# Patient Record
Sex: Female | Born: 1970
Health system: Southern US, Community
[De-identification: ages and names within clinical notes are randomized; demographics above are authoritative.]

## PROBLEM LIST (undated history)

## (undated) DIAGNOSIS — K219 Gastro-esophageal reflux disease without esophagitis: Secondary | ICD-10-CM

## (undated) DIAGNOSIS — M1990 Unspecified osteoarthritis, unspecified site: Secondary | ICD-10-CM

## (undated) DIAGNOSIS — Z923 Personal history of irradiation: Secondary | ICD-10-CM

## (undated) DIAGNOSIS — C50511 Malignant neoplasm of lower-outer quadrant of right female breast: Secondary | ICD-10-CM

## (undated) DIAGNOSIS — Z800 Family history of malignant neoplasm of digestive organs: Secondary | ICD-10-CM

## (undated) DIAGNOSIS — C50919 Malignant neoplasm of unspecified site of unspecified female breast: Secondary | ICD-10-CM

## (undated) HISTORY — DX: Unspecified osteoarthritis, unspecified site: M19.90

## (undated) HISTORY — DX: Family history of malignant neoplasm of digestive organs: Z80.0

## (undated) HISTORY — PX: WISDOM TOOTH EXTRACTION: SHX21

## (undated) HISTORY — DX: Malignant neoplasm of unspecified site of unspecified female breast: C50.919

## (undated) HISTORY — DX: Malignant neoplasm of lower-outer quadrant of right female breast: C50.511

---

## 2015-06-01 ENCOUNTER — Encounter (HOSPITAL_COMMUNITY): Payer: Self-pay

## 2015-06-01 ENCOUNTER — Ambulatory Visit (HOSPITAL_COMMUNITY)
Admission: RE | Admit: 2015-06-01 | Discharge: 2015-06-01 | Disposition: A | Discharge status: Home / Self Care | Payer: Medicaid Other | Source: Ambulatory Visit | Attending: Obstetrics and Gynecology | Admitting: Obstetrics and Gynecology

## 2015-06-01 VITALS — BP 112/80 | Temp 98.8°F | Ht 67.0 in | Wt 224.0 lb

## 2015-06-01 DIAGNOSIS — N63 Unspecified lump in breast: Secondary | ICD-10-CM | POA: Diagnosis present

## 2015-06-01 DIAGNOSIS — N6314 Unspecified lump in the right breast, lower inner quadrant: Secondary | ICD-10-CM

## 2015-06-01 DIAGNOSIS — Z01419 Encounter for gynecological examination (general) (routine) without abnormal findings: Secondary | ICD-10-CM | POA: Diagnosis not present

## 2015-06-01 NOTE — Progress Notes (Signed)
Complaints of right breast lump x 8 weeks that is painful at times. Patient stated she hasn't felt any pain within the last week. Patient rated pain at a 3-6 out of 10.  Pap Smear: Completed Pap smear today. Last Pap smear was 7 years ago at [facility] and normal per patient. Per patient has no history of an abnormal Pap smear. No Pap smear results in EPIC.  Physical exam: Breasts Breasts symmetrical. No skin abnormalities left breast. Scar on right outer breast where patient stated she had a lump that used a natural treatment to heal that left a scar. No nipple retraction bilateral breasts. No nipple discharge bilateral breasts. No lymphadenopathy. Palpated a lump versus thickened tissue right breast at 4 o'clock under areola. Referred patient to [facility] for diagnostic mammogram. Appointment scheduled for Monday, June 05, 2015 at 0815.          Pelvic/Bimanual   Ext Genitalia No lesions, no swelling and no discharge observed on external genitalia.         Vagina Vagina pink and normal texture. No lesions or discharge observed in vagina.          Cervix Cervix is present. Cervix pink and of normal texture. No discharge observed.    Uterus Uterus is present and palpable. Uterus in normal position and normal size.       Adnexae Bilateral ovaries present and palpable. No tenderness on palpation.        Rectovaginal No rectal exam completed today since patient had no rectal complaints. No skin abnormalities observed.

## 2015-06-01 NOTE — Patient Instructions (Signed)
Educational materials on breast self awareness given. Explained to [employer] that [place] will cover Pap smears and HPV typing every 5 years unless has a history of abnormal Pap smears. Referred patient to [facility] for diagnostic mammogram. Appointment scheduled for Monday, June 05, 2015 at 0815. Patient aware of appointment and will be there. Donivan Scull verbalized understanding.  Xylan Sheils, Arvil Chaco, RN 2:08 PM

## 2015-06-07 LAB — CYTOLOGY - PAP

## 2015-06-13 ENCOUNTER — Telehealth (HOSPITAL_COMMUNITY): Payer: Self-pay | Admitting: *Deleted

## 2015-06-13 NOTE — Telephone Encounter (Signed)
Called patient and gave results to Pap smear and HPV typing. Let her know that both were negative and that her next Pap smear is due in 5 years. Patient verbalized understanding.

## 2015-06-15 ENCOUNTER — Other Ambulatory Visit: Payer: Self-pay | Admitting: Radiology

## 2015-06-19 ENCOUNTER — Other Ambulatory Visit: Payer: Self-pay | Admitting: Radiology

## 2015-06-19 DIAGNOSIS — D0591 Unspecified type of carcinoma in situ of right breast: Secondary | ICD-10-CM

## 2015-06-20 ENCOUNTER — Telehealth: Payer: Self-pay | Admitting: *Deleted

## 2015-06-20 ENCOUNTER — Encounter: Payer: Self-pay | Admitting: *Deleted

## 2015-06-20 DIAGNOSIS — Z171 Estrogen receptor negative status [ER-]: Secondary | ICD-10-CM

## 2015-06-20 DIAGNOSIS — C50511 Malignant neoplasm of lower-outer quadrant of right female breast: Secondary | ICD-10-CM

## 2015-06-20 DIAGNOSIS — C50512 Malignant neoplasm of lower-outer quadrant of left female breast: Secondary | ICD-10-CM | POA: Insufficient documentation

## 2015-06-20 HISTORY — DX: Malignant neoplasm of lower-outer quadrant of right female breast: C50.511

## 2015-06-20 NOTE — Telephone Encounter (Signed)
Confirmed [facility] for 06/28/15 at 0830 .  Instructions and contact information given.

## 2015-06-21 ENCOUNTER — Ambulatory Visit
Admission: RE | Admit: 2015-06-21 | Discharge: 2015-06-21 | Disposition: A | Discharge status: Home / Self Care | Payer: No Typology Code available for payment source | Source: Ambulatory Visit | Attending: Radiology | Admitting: Radiology

## 2015-06-21 DIAGNOSIS — D0591 Unspecified type of carcinoma in situ of right breast: Secondary | ICD-10-CM

## 2015-06-21 MED ORDER — GADOBENATE DIMEGLUMINE 529 MG/ML IV SOLN
20.0000 mL | Freq: Once | INTRAVENOUS | Status: AC | PRN
  Administered 2015-06-21: 20 mL via INTRAVENOUS

## 2015-06-23 ENCOUNTER — Ambulatory Visit: Payer: Self-pay

## 2015-06-27 ENCOUNTER — Other Ambulatory Visit: Payer: Self-pay | Admitting: Radiology

## 2015-06-28 ENCOUNTER — Other Ambulatory Visit (HOSPITAL_BASED_OUTPATIENT_CLINIC_OR_DEPARTMENT_OTHER): Payer: Medicaid Other

## 2015-06-28 ENCOUNTER — Encounter: Payer: Self-pay | Admitting: Physical Therapy

## 2015-06-28 ENCOUNTER — Ambulatory Visit: Payer: Medicaid Other | Attending: General Surgery | Admitting: Physical Therapy

## 2015-06-28 ENCOUNTER — Encounter: Payer: Self-pay | Admitting: Oncology

## 2015-06-28 ENCOUNTER — Ambulatory Visit
Admission: RE | Admit: 2015-06-28 | Discharge: 2015-06-28 | Disposition: A | Discharge status: Home / Self Care | Payer: Self-pay | Source: Ambulatory Visit | Attending: Radiation Oncology | Admitting: Radiation Oncology

## 2015-06-28 ENCOUNTER — Ambulatory Visit (HOSPITAL_BASED_OUTPATIENT_CLINIC_OR_DEPARTMENT_OTHER): Payer: Medicaid Other | Admitting: Oncology

## 2015-06-28 ENCOUNTER — Encounter: Payer: Self-pay | Admitting: Nurse Practitioner

## 2015-06-28 ENCOUNTER — Encounter: Payer: Self-pay | Admitting: Skilled Nursing Facility1

## 2015-06-28 ENCOUNTER — Other Ambulatory Visit: Payer: Self-pay | Admitting: *Deleted

## 2015-06-28 VITALS — BP 131/71 | HR 76 | Temp 98.3°F | Resp 18 | Ht 67.0 in | Wt 223.7 lb

## 2015-06-28 DIAGNOSIS — C50511 Malignant neoplasm of lower-outer quadrant of right female breast: Secondary | ICD-10-CM

## 2015-06-28 DIAGNOSIS — R293 Abnormal posture: Secondary | ICD-10-CM | POA: Diagnosis present

## 2015-06-28 LAB — CBC WITH DIFFERENTIAL/PLATELET
BASO%: 0.8 % (ref 0.0–2.0)
BASOS ABS: 0.1 10*3/uL (ref 0.0–0.1)
EOS ABS: 0.1 10*3/uL (ref 0.0–0.5)
EOS%: 1.2 % (ref 0.0–7.0)
HEMATOCRIT: 41.4 % (ref 34.8–46.6)
HGB: 13.9 g/dL (ref 11.6–15.9)
LYMPH#: 2.3 10*3/uL (ref 0.9–3.3)
LYMPH%: 29.8 % (ref 14.0–49.7)
MCH: 30 pg (ref 25.1–34.0)
MCHC: 33.7 g/dL (ref 31.5–36.0)
MCV: 89.1 fL (ref 79.5–101.0)
MONO#: 0.4 10*3/uL (ref 0.1–0.9)
MONO%: 4.8 % (ref 0.0–14.0)
NEUT#: 4.9 10*3/uL (ref 1.5–6.5)
NEUT%: 63.4 % (ref 38.4–76.8)
PLATELETS: 337 10*3/uL (ref 145–400)
RBC: 4.64 10*6/uL (ref 3.70–5.45)
RDW: 12.2 % (ref 11.2–14.5)
WBC: 7.7 10*3/uL (ref 3.9–10.3)

## 2015-06-28 LAB — COMPREHENSIVE METABOLIC PANEL (CC13)
ALT: 14 U/L (ref 0–55)
ANION GAP: 7 meq/L (ref 3–11)
AST: 15 U/L (ref 5–34)
Albumin: 3.8 g/dL (ref 3.5–5.0)
Alkaline Phosphatase: 57 U/L (ref 40–150)
BUN: 12.2 mg/dL (ref 7.0–26.0)
CALCIUM: 9.6 mg/dL (ref 8.4–10.4)
CHLORIDE: 105 meq/L (ref 98–109)
CO2: 27 mEq/L (ref 22–29)
CREATININE: 0.9 mg/dL (ref 0.6–1.1)
EGFR: 80 mL/min/{1.73_m2} — AB (ref >90)
Glucose: 112 mg/dl (ref 70–140)
POTASSIUM: 4 meq/L (ref 3.5–5.1)
Sodium: 139 mEq/L (ref 136–145)
Total Bilirubin: 0.51 mg/dL (ref 0.20–1.20)
Total Protein: 7.3 g/dL (ref 6.4–8.3)

## 2015-06-28 NOTE — Patient Instructions (Signed)

## 2015-06-28 NOTE — Progress Notes (Signed)
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 She brought it to medical attention when she presented for the free Pap smear screening clinic here 06/01/2015. Exam on that day showed a lump in the right breast at the 4:00 position under the areola. There was also a scar on the right outer breast. This scar was noted in 01/04/2008 when she had her baseline mammogram at [facility]. It had been stable for a year at that time and the patient had treated it with a "blood root paste" which caused scarring. 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 It is in a separate location from the new mass.  On 06/05/2015 [name] underwent bilateral diagnostic mammography at [facility] with bilateral ultrasonography. This found the breast density to be category B. There was a 2.5 cm oval mass in the right breast at the 5:00 position which by ultrasound measured 2.1 cm. In the left breast there was an area of focal asymmetry which by ultrasound was consistent with fibroglandular tissue/benign.  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  Her subsequent history is as detailed below.  INTERVAL HISTORY: Aryanna was evaluated in the multidisciplinary breast cancer clinic 06/28/2015 accompanied by her mother [name] and her sister [name]. Her case was also presented in the multidisciplinary breast cancer conference that same morning. At that time a preliminary plan was proposed: Breast conserving surgery with sentinel lymph node sampling, chemotherapy with anti-HER-2 treatment, either neo-adjuvantly or adjuvantly, adjuvant radiation, and genetics referral.  REVIEW OF SYSTEMS: Aside from the mass itself, there were no specific symptoms leading to the original mammogram, which was routinely scheduled. The patient denies unusual headaches, visual changes, nausea, vomiting, stiff neck, dizziness, or gait imbalance. There has been no cough, phlegm production, or pleurisy, no chest pain or pressure, and no change in bowel or bladder habits. The patient denies fever, rash, bleeding, unexplained fatigue or unexplained weight loss. Arletha does complain of hot flashes and night sweats. She has some perimenopausal symptoms including fatigue. She has some heartburn issues. She has plantar fasciitis. A detailed review of systems was otherwise entirely negative.    Marland Kitchen PAST MEDICAL HISTORY: Past Medical History  Diagnosis Date  . Breast cancer of lower-outer quadrant of right female breast ([place]) 06/20/2015  . Breast cancer ([place])   . Arthritis     PAST SURGICAL HISTORY: History reviewed. No pertinent past surgical history.  FAMILY HISTORY Family History  Problem Relation Age of Onset  . Diabetes Mother   . Hypertension Mother   . Hyperlipidemia Mother   . Cancer Maternal Grandfather   . Diabetes Paternal Grandmother    the patient's parents are both  living, in their early 69s. The patient had no brother, one sister. The patient's mother works by  Education administrator homes for private clients. The patient's sister is a Pharmacist, hospital in [place] there is no history of breast or ovarian cancer in the family. The patient's paternal grandfather died at the age of 36 from stomach cancer felt to be related to iron unusual toxic exposure   GYNECOLOGIC HISTORY:  Patient's last menstrual period was 05/11/2015 (approximate).  menarche age 47, she is [place] P0  she is still having periods but they're quite sporadic. They may occur 6 weeks apart or 3 weeks apart, may be heavy or only spotting. She used oral contraceptives for more than 20 years with no complications   SOCIAL HISTORY:  Simi works multiple jobs as Haematologist, Designer, industrial/product, Radiation protection practitioner, and other part-time jobs. She is divorced and at home lives with 6 dogs.     ADVANCED DIRECTIVES:  not in place    HEALTH MAINTENANCE: Social History  Substance Use Topics  . Smoking status: Never Smoker   . Smokeless tobacco: Never Used  . Alcohol Use: No     Colonoscopy:  PAP: October 2016   Bone density:  Lipid panel:  No Known Allergies  No current outpatient prescriptions on file.   No current facility-administered medications for this visit.    OBJECTIVE: Middle-aged white woman in no acute distress  Filed Vitals:   06/28/15 0835  BP: 131/71  Pulse: 76  Temp: 98.3 F (36.8 C)  Resp: 18     Body mass index is 35.03 kg/(m^2).    ECOG FS:0 - Asymptomatic   Ocular: Sclerae unicteric, pupils equal, round and reactive to light Ear-nose-throat: Oropharynx clear and moist, dentition in good repair  Lymphatic: No cervical or supraclavicular adenopathy Lungs no rales or rhonchi, good excursion bilaterally Heart regular rate and rhythm, no murmur appreciated Abd soft, nontender, positive bowel sounds MSK no focal spinal tenderness, no joint edema Neuro: non-focal, well-oriented, appropriate affect Breasts:  the right breast is status post recent biopsy. There is no significant  ecchymosis. There is a palpable abnormality in the lateral aspect of the breast measuring approximately 2 cm. There are no skin or nipple changes otherwise. The right axilla is benign per the left breast is unremarkable.    LAB RESULTS:  CMP     Component Value Date/Time   NA 139 06/28/2015 0818   K 4.0 06/28/2015 0818   CO2 27 06/28/2015 0818   GLUCOSE 112 06/28/2015 0818   BUN 12.2 06/28/2015 0818   CREATININE 0.9 06/28/2015 0818   CALCIUM 9.6 06/28/2015 0818   PROT 7.3 06/28/2015 0818   ALBUMIN 3.8 06/28/2015 0818   AST 15 06/28/2015 0818   ALT 14 06/28/2015 0818   ALKPHOS 57 06/28/2015 0818   BILITOT 0.51 06/28/2015 0818    INo results found for: SPEP, UPEP  Lab Results  Component Value Date   WBC 7.7 06/28/2015   NEUTROABS 4.9 06/28/2015   HGB 13.9 06/28/2015   HCT 41.4 06/28/2015   MCV 89.1 06/28/2015   PLT 337 06/28/2015      Chemistry      Component Value Date/Time   NA 139 06/28/2015 0818   K 4.0 06/28/2015 0818   CO2 27 06/28/2015 0818   BUN 12.2 06/28/2015 0818   CREATININE 0.9 06/28/2015 0818      Component Value Date/Time   CALCIUM 9.6 06/28/2015 0818   ALKPHOS 57 06/28/2015 0818   AST 15  06/28/2015 0818   ALT 14 06/28/2015 0818   BILITOT 0.51 06/28/2015 0818       No results found for: LABCA2  No components found for: LABCA125  No results for input(s): INR in the last 168 hours.  Urinalysis No results found for: COLORURINE, APPEARANCEUR, LABSPEC, PHURINE, GLUCOSEU, HGBUR, BILIRUBINUR, KETONESUR, PROTEINUR, UROBILINOGEN, NITRITE, LEUKOCYTESUR  STUDIES: Mr Breast Bilateral W Wo Contrast  06/21/2015  CLINICAL DATA:  45 year old female who presented with a palpable right breast mass. The mass in the lower-inner right breast has been subsequently biopsied demonstrating malignancy, which may represent "a primary invasive mammary carcinoma or metastatic disease within a lymph node". LABS:  No recent laboratory values. Correlation made with  prior mammography, the most recent from 06/05/2015. EXAM: BILATERAL BREAST MRI WITH AND WITHOUT CONTRAST TECHNIQUE: Multiplanar, multisequence MR images of both breasts were obtained prior to and following the intravenous administration of 20 ml of MultiHance. THREE-DIMENSIONAL MR IMAGE RENDERING ON INDEPENDENT WORKSTATION: Three-dimensional MR images were rendered by post-processing of the original MR data on an independent workstation. The three-dimensional MR images were interpreted, and findings are reported in the following complete MRI report for this study. Three dimensional images were evaluated at the independent DynaCad workstation COMPARISON:  No prior MRIs available for comparison. FINDINGS: Breast composition: b. Scattered fibroglandular tissue. Background parenchymal enhancement: Marked. Right breast: There is marked background parenchymal foci of enhancement similar to the contralateral breast. In the lower-inner quadrant of the right breast, anterior depth there is an oval avidly enhancing mass with washout kinetics measuring 1.9 x 1.6 x 1.7 cm. A focus of susceptibility seen along the inferolateral aspect of the mass, compatible with a biopsy marking clip. In the upper-outer quadrant of the right breast, there is a heterogeneously enhancing circumscribed oval mass measuring 3.3 x 2.4 cm in axial plane. 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 Just medial to the superior edge of this suspected fibroadenoma, is a suspicious 0.9 x 0.8 x 0.8 cm round enhancing mass at 12 o'clock, anterior depth (series 7, image 67). Left breast: Though there is marked background parenchymal foci of enhancement similar to the contralateral breast, there is no suspicious enhancing mass or abnormal non mass enhancement. Lymph nodes: No abnormal appearing lymph nodes. Ancillary findings:  None. IMPRESSION: 1. The  biopsy-proven malignancy in the lower-inner quadrant of the right breast measures up to 1.9 cm. 2. There is a suspicious 0.9 cm enhancing mass at 12 o'clock just adjacent to the larger benign mass in the right breast. 3.  No MRI evidence of malignancy in the left breast. 4. Marked background parenchymal foci of enhancement bilaterally, limiting sensitivity of MRI. RECOMMENDATION: Second-look ultrasound recommended for the 0.9 cm mass in the right breast at 12 o'clock, anterior depth. If not seen sonographically, a MRI guided biopsy will be recommended. BI-RADS CATEGORY  BI-RADS 4: Suspicious. Electronically Signed   By: Ammie Ferrier M.D.   On: 06/21/2015 14:41    ASSESSMENT: 44 y.o. [place] woman status post left breast biopsy 06/15/2015 for a clinical T1 cN0, stage IA invasive ductal carcinoma, grade 3, estrogen and progesterone receptor negative, HER-2 amplified, with an MIB-1 of 40%  (1) neoadjuvant chemotherapy to consist of carboplatin, docetaxel, pertuzumab, and trastuzumab with onpro support every 3 weeks 6  (2) breast conserving surgery with sentinel lymph node sampling to follow  (3) adjuvant radiation to follow surgery  (4) trastuzumab  to be continued to complete a year  (5) genetics testing pending  PLAN: We spent the better part of today's hour-long appointment discussing the biology of breast cancer in general, and the specifics of the patient's tumor in particular. Georgina Peer understands while her cancer is stage I (or early stage II if we use the ultrasound measurement) this is an aggressive fast-growing tumor and will require systemic therapy including anti-HER-2 immunotherapy and chemotherapy. She will not be a candidate for anti-estrogens since the tumor is estrogen and progesterone receptor negative.  She understands the difference between local and systemic treatments and the fact that there is no survival difference between sequencing chemotherapy/anti-HER-2 treatments  before surgery or doing at the other way around. In her case because of the delay in the genetics testing and because we would like to make sure to save her nipple neoadjuvant systemic therapy appears preferable.  We discussed the possible toxicities, side effects and complications of carboplatin and docetaxel as well as trastuzumab and pertuzumab. She will need an echocardiogram, and to come to chemotherapy school as well as have a port insertion before we can start, but we are hoping to begin her chemotherapy on December 6. Before that date also she will meet with either myself or my 98 assistant to discuss anti-emetics and other supportive therapies.  By the time she completes her chemotherapy we should have the genetics results. If she does carry a deleterious mutation she may wish to proceed to bilateral mastectomies, although that would not be mandatory. We could instead do yearly MRIs to intensifies screening. In that regard her financial situation may be decisive. If we cannot arrange for yearly MRIs then bilateral mastectomies may be harder to avoid. Of course if she does carry a deleterious mutation she would also need bilateral salpingo-oophorectomy.  Nayelly has a good understanding of the overall plan. She agrees with it. She knows the goal of treatment in her case is cure. She will call with any problems that may develop before her next visit here.  Chauncey Cruel, MD   06/28/2015 2:16 PM Medical Oncology and Hematology [facility] [address] [place], [place] [address] Tel. [phone]    Fax. [phone]

## 2015-06-28 NOTE — Therapy (Signed)
[place], [place], [address] Phone: [phone]   Fax:  [phone]  Physical Therapy Evaluation  Patient Details  Name: Phyllis Wiggins MRN: [id number] Date of Birth: 1971/04/28 Referring Provider: Dr. Autumn Messing  Encounter Date: 06/28/2015      PT End of Session - 06/28/15 1226    Visit Number 1   Number of Visits 1   PT Start Time 7371   PT Stop Time 1208   PT Time Calculation (min) 33 min   Activity Tolerance Patient tolerated treatment well   Behavior During Therapy [facility] for tasks assessed/performed      Past Medical History  Diagnosis Date  . Breast cancer of lower-outer quadrant of right female breast ([place]) 06/20/2015  . Breast cancer ([place])   . Arthritis     History reviewed. No pertinent past surgical history.  There were no vitals filed for this visit.  Visit Diagnosis:  Carcinoma of lower outer quadrant of right breast ([place]) - Plan: PT plan of care cert/re-cert  Abnormal posture - Plan: PT plan of care cert/re-cert      Subjective Assessment - 06/28/15 1216    Subjective Patient was seen today for a baseline assessment of her newly diagnosed right breast cancer.   Patient is accompained by: Family member   Pertinent History Patient was diagnosed on 06/05/15 with right grade 3 ER/PR negative, HER2 positive breast cancer.  The mass measures 1.9 cm on MRI and is located in the lower inner quadrant and has a Ki67 of 40%.   Patient Stated Goals Reduce lymphedema risk and learn post op shoulder ROM HEP   Currently in Pain? No/denies            [facility] PT Assessment - 06/28/15 0001    Assessment   Medical Diagnosis Right breast cancer   Referring Provider Dr. Autumn Messing   Onset Date/Surgical Date 06/05/15   Hand Dominance Right   Prior Therapy none   Precautions   Precautions Other (comment)  Active breast cancer   Restrictions   Weight Bearing Restrictions No   Balance  Screen   Has the patient fallen in the past 6 months No   Has the patient had a decrease in activity level because of a fear of falling?  No   Is the patient reluctant to leave their home because of a fear of falling?  No   Home Social worker Private residence   Living Arrangements Alone   Available Help at Discharge Family   Prior Function   Level of Independence Independent   Vocation Full time employment   Vocation Requirements Dog Dentist   Leisure She does not exercise   Cognition   Overall Cognitive Status Within Functional Limits for tasks assessed   Posture/Postural Control   Posture/Postural Control Postural limitations   Postural Limitations Forward head;Rounded Shoulders   ROM / Strength   AROM / PROM / Strength AROM;Strength   AROM   AROM Assessment Site Shoulder   Right/Left Shoulder Right;Left   Right Shoulder Extension 61 Degrees   Right Shoulder Flexion 162 Degrees   Right Shoulder ABduction 170 Degrees   Right Shoulder Internal Rotation 70 Degrees   Right Shoulder External Rotation 90 Degrees   Left Shoulder Extension 60 Degrees   Left Shoulder Flexion 174 Degrees   Left Shoulder ABduction 162 Degrees   Left Shoulder Internal Rotation 80 Degrees   Left Shoulder External Rotation 90 Degrees  Strength   Overall Strength Within functional limits for tasks performed           LYMPHEDEMA/ONCOLOGY QUESTIONNAIRE - 06/28/15 1225    Type   Cancer Type Right breast cancer   Lymphedema Assessments   Lymphedema Assessments Upper extremities   Right Upper Extremity Lymphedema   10 cm Proximal to Olecranon Process 34.5 cm   Olecranon Process 26.7 cm   10 cm Proximal to Ulnar Styloid Process 24.6 cm   Just Proximal to Ulnar Styloid Process 15.9 cm   Across Hand at [employer] 19.1 cm   At [place] of 2nd Digit 6.1 cm   Left Upper Extremity Lymphedema   10 cm Proximal to Olecranon Process 33.8 cm   Olecranon Process  26.5 cm   10 cm Proximal to Ulnar Styloid Process 23.6 cm   Just Proximal to Ulnar Styloid Process 16.3 cm   Across Hand at [employer] 18 cm   At [place] of 2nd Digit 6 cm            Patient was instructed today in a home exercise program today for post op shoulder range of motion. These included active assist shoulder flexion in sitting, scapular retraction, wall walking with shoulder abduction, and hands behind head external rotation.  She was encouraged to do these twice a day, holding 3 seconds and repeating 5 times when permitted by her physician.                 PT Education - 06/28/15 1226    Education provided Yes   Education Details Lymphedema risk reduction and post op shoulder ROM HEP   Person(s) Educated Patient   Methods Explanation;Demonstration;Handout   Comprehension Verbalized understanding;Returned demonstration              Breast Clinic Goals - 06/28/15 1228    Patient will be able to verbalize understanding of pertinent lymphedema risk reduction practices relevant to her diagnosis specifically related to skin care.   Time 1   Period Days   Status Achieved   Patient will be able to return demonstrate and/or verbalize understanding of the post-op home exercise program related to regaining shoulder range of motion.   Time 1   Period Days   Status Achieved   Patient will be able to verbalize understanding of the importance of attending the postoperative After Breast Cancer Class for further lymphedema risk reduction education and therapeutic exercise.   Time 1   Period Days   Status Achieved              Plan - 06/28/15 1226    Clinical Impression Statement Patient was diagnosed on 06/05/15 with right grade 3 ER/PR negative, HER2 positive breast cancer.  The mass measures 1.9 cm on MRI and is located in the lower inner quadrant and has a Ki67 of 40%.  She is planning to have neoadjuvant chemotherapy followed by right lumpectomy and  sentinel node biopsy followed by radiation.  She will benefit from post op PT to regain shoulder ROM and reduce lymphedema risk if needed.   Pt will benefit from skilled therapeutic intervention in order to improve on the following deficits Decreased range of motion;Impaired UE functional use;Decreased knowledge of precautions;Pain;Decreased strength   Rehab Potential Excellent   Clinical Impairments Affecting Rehab Potential none   PT Frequency One time visit   PT Treatment/Interventions Therapeutic exercise;Patient/family education   Consulted and Agree with Plan of Care Patient;Family member/caregiver   Family Member  Consulted Mom and sister     Patient will follow up at outpatient cancer rehab if needed following surgery.  If the patient requires physical therapy at that time, a specific plan will be dictated and sent to the referring physician for approval. The patient was educated today on appropriate basic range of motion exercises to begin post operatively and the importance of attending the After Breast Cancer class following surgery.  Patient was educated today on lymphedema risk reduction practices as it pertains to recommendations that will benefit the patient immediately following surgery.  She verbalized good understanding.  No additional physical therapy is indicated at this time.       Problem List Patient Active Problem List   Diagnosis Date Noted  . Breast cancer of lower-outer quadrant of right female breast [facility]) 06/20/2015    Annia Friendly, PT 06/28/2015 12:30 PM  [place], [place], [address] Phone: [phone]   Fax:  [phone]  Name: Phyllis Wiggins MRN: [id number] Date of Birth: May 26, 1971

## 2015-06-28 NOTE — Progress Notes (Signed)
Subjective:     Patient ID: Phyllis Wiggins, female   DOB: 1971/03/14, 44 y.o.   MRN: [id number]  HPI   Review of Systems     Objective:   Physical Exam For the patient to understand and be given the tools to implement a healthy plant based diet during their cancer diagnosis.     Assessment:     Patient was seen today and found to be in good spirits and accompanied by her family. Pt states she knows what she should eat and does not need education. Pt was extremely skeptical of soy and refuses to eat it even after the information was presented. Pt states she is allergic to lactose and whey. Pts medication: essential oils. Pts labs WNL. Pts ht 5'7'', wt 223 pounds, and BMI 35.1.      Plan:     Dietitian educated the patient on implementing a plant based diet by incorporating more plant proteins, fruits, and vegetables. As a part of a healthy routine physical activity was discussed. Dietitian had the pt educate her but the pt could not answer any of the questions correctly based on current research. The importance of legitimate, evidence based information was discussed and examples were given. A folder of evidence based information with a focus on a plant based diet and general nutrition during cancer was given to the patient.  As a part of the continuum of care the cancer dietitian's contact information was given to the patient in the event they would like to have a follow up appointment.

## 2015-06-28 NOTE — Progress Notes (Signed)
Radiation Oncology         [phone]) [phone] ________________________________  Initial outpatient Consultation - Date: 06/28/2015   Name: Phyllis Wiggins MRN: [id number]   DOB: [birth year]  REFERRING PHYSICIAN: No ref. provider found  DIAGNOSIS AND STAGE: T2 N0 right breast cancer.  HISTORY OF PRESENT ILLNESS::Phyllis Wiggins is a 44 y.o. female who presents with a palpable right breast mass. She has a history of a benign fibroadenoma which was biopsied in 2009. This mass measured 2.1 cm. 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 MRI was performed which showed mass seen on mammogram as well as a second mass adjacent to the fibroadenoma. These masses were 4.3 cm apart. This second mass was biopsied 11/22 and pathology was benign.  She is accompanied by her mother who has many friends who completed breast treatment and her sister. She has many food allergies per her report.   She is GXP0 with her last menstrual period recently. She is using oral contraceptives.   She was actually the foster mom of my dog, JC, and has between 7 and 11 dogs as well as a chinchilla who live with her full time.   PREVIOUS RADIATION THERAPY: No  Past medical, social and family history were reviewed in the electronic chart. Review of symptoms was reviewed in the electronic chart. Medications were reviewed in the electronic chart.   PHYSICAL EXAM:. BP:  131/71   Pulse:  76   Temp:  98.3 F (36.8 C)   Resp:  18   Body mass index is 35.03 kg/(m^2). ECOG FS:0 - Asymptomatic   She is a pleasant female in no distress. She has 2 biopsy sites in the right breast. There is a 2 cm palpable mass in the upper outer quadrant of the right breast. She has no palpable axillary adenopathy. She has no abnormalities of the left breast. She is alert and oriented x 3.   IMPRESSION: [name] is a 43 yo female with T2 N0 right breast cancer.  PLAN:We discussed the  role of radiation and decreasing local failures in patients who undergo lumpectomy. We discussed the retrospective data showing an increase in failure rates in patients who have a pathologic complete response and did not undergo radiation. For this reason I have recommended radiation to the whole breast followed by boost to the tumor bed regardless of her response to chemotherapy. We discussed the process of simulation the placement tattoos. We discussed possible side effects during treatment including but not limited to skin irritation darkness and fatigue. We discussed long-term effects of treatment which are extremely unlikely but possible including damage to the lungs and ribs. We discussed the low likelihood of secondary malignancies.   She will undergo genetic testing, neoadjuvant chemotherapy, lumpectomy and then see me back for radiation.   She also met with surgery, medical oncology as well as a member of our patient family support team, a dietitian, survivorship nurse practitioner, breast cancer navigator, and our physical therapist. She is concerned about finances especially continuing to provide for her pets.  I will refer her to social work.    I spent 40 minutes  face to face with the patient and more than 50% of that time was spent in counseling and/or coordination of care.   ------------------------------------------------  [name], MD    This document serves as a record of services personally performed by [name], MD. It was created on her behalf  by  Lendon Collar, a trained medical scribe. The creation of this record is based on the scribe's personal observations and the provider's statements to them. This document has been checked and approved by the attending provider.

## 2015-06-28 NOTE — Progress Notes (Signed)
Phyllis Wiggins is a very pleasant 44 y.o. female from [place], [place] with newly diagnosed invasive mammary carcinoma of the right breast.  Biopsy results revealed the tumor's prognostic profile is ER negative, PR negative, and HER2/neu positive..  She presents today with her mom and sister to the [facility]) for treatment consideration and recommendations from the breast surgeon, radiation oncologist, and medical oncologist.     I briefly met with Phyllis Wiggins, her mother, and sister during her [facility] visit today. We discussed the purpose of the Survivorship Clinic, which will include monitoring for recurrence, coordinating completion of age and gender-appropriate cancer screenings, promotion of overall wellness, as well as managing potential late/long-term side effects of anti-cancer treatments.    The treatment plan for Phyllis Wiggins will likely include surgery, chemotherapy, and radiation therapy.  She will meet with the Genetics Counselor due to her age. As of today, the intent of treatment for Phyllis Wiggins is cure, therefore she will be eligible for the Survivorship Clinic upon her completion of treatment.  Her survivorship care plan (SCP) document will be drafted and updated throughout the course of her treatment trajectory. She will receive the SCP in an office visit with myself in the Survivorship Clinic once she has completed treatment.   Phyllis Wiggins was encouraged to ask questions and all questions were answered to her satisfaction.  She was given my business card and encouraged to contact me with any concerns regarding survivorship.  I look forward to participating in her care.   Kenn File, [place] [phone]

## 2015-06-30 ENCOUNTER — Telehealth: Payer: Self-pay | Admitting: Nurse Practitioner

## 2015-06-30 NOTE — Telephone Encounter (Signed)
Called and left a message with new echo and chemo class as she is having her port placed 12/1

## 2015-07-03 ENCOUNTER — Telehealth: Payer: Self-pay | Admitting: *Deleted

## 2015-07-03 NOTE — Telephone Encounter (Signed)
Spoke to pt concerning Phyllis Wiggins from 06/28/15. Denies questions or concerns regarding dx or treatment care plan. Relate she has financial concerns. Informed pt that I have reached out to [facility] to call her to discuss programs we have to offer. R/s f/u with Heather to 12/2 d/t pt having port placed on 12/1. Encourage pt to call with needs. Received verbal understanding.

## 2015-07-04 ENCOUNTER — Encounter: Payer: Self-pay | Admitting: Oncology

## 2015-07-04 NOTE — Progress Notes (Signed)
Spoke w/ pt regarding financial concerns.  Pt is enrolled in the BCCCP program and has applied for Medicaid thru [place].  The application is in a pending status.  I informed her of drug replacement that will help with her chemotherapy treatment until Medicaid becomes active.  Informed her of the $1000 [employer] which she says isn't enough but would like to apply for the grant.  I will obtain her check stubs from Etheleen Sia to see if she qualifies for the grant.  I will also forward those check stubs to the Managed Care team for drug replacement.  I let her know of the [employer] and [employer] to apply for additional assistance and we will be happy to fax or email those applications in her behalf.  She verbalized understanding.

## 2015-07-05 ENCOUNTER — Encounter (HOSPITAL_COMMUNITY): Payer: Self-pay | Admitting: *Deleted

## 2015-07-05 ENCOUNTER — Encounter: Payer: Self-pay | Admitting: *Deleted

## 2015-07-05 ENCOUNTER — Other Ambulatory Visit: Payer: Self-pay | Admitting: General Surgery

## 2015-07-05 MED ORDER — CEFAZOLIN SODIUM-DEXTROSE 2-3 GM-% IV SOLR
2.0000 g | INTRAVENOUS | Status: AC
  Administered 2015-07-06: 2 g via INTRAVENOUS
  Filled 2015-07-05: qty 50

## 2015-07-05 MED ORDER — CHLORHEXIDINE GLUCONATE 4 % EX LIQD
1.0000 | Freq: Once | CUTANEOUS | Status: DC
Start: 2015-07-05 — End: 2015-07-08

## 2015-07-05 MED ORDER — CHLORHEXIDINE GLUCONATE 4 % EX LIQD: 1.0000 "application " | Freq: Once | CUTANEOUS | Status: DC

## 2015-07-05 NOTE — Progress Notes (Signed)
[place] Work  Clinical Social Work was referred by Pension scheme manager for assessment of psychosocial needs.  Clinical Social Worker contacted patient at home to offer support and assess for needs.   Pt pretty stressed over the phone and expressed concerns about finances, caring for her animals and coping with treatment. CSW provided supportive listening, safe space to share. CSW reviewed several resources for financial support. Pt and CSW to meet with pt prior to her medonc appt on 07/07/15 to further discuss. Pt appreciative of call and support.   Clinical Social Work interventions:  Supportive listening Resource education  Loren Racer, [place] Worker [place]  [place] Phone: [phone] Fax: [phone]

## 2015-07-05 NOTE — Progress Notes (Addendum)
Pt denies cardiac history, chest pain or sob.   Called Dr. Ethlyn Gallery office to request pre-op orders, spoke with Coralyn Mark.

## 2015-07-06 ENCOUNTER — Ambulatory Visit: Payer: Self-pay | Admitting: Nurse Practitioner

## 2015-07-06 ENCOUNTER — Ambulatory Visit (HOSPITAL_COMMUNITY)
Admission: RE | Admit: 2015-07-06 | Discharge: 2015-07-06 | Disposition: A | Discharge status: Home / Self Care | Payer: Medicaid Other | Source: Ambulatory Visit | Attending: General Surgery | Admitting: General Surgery

## 2015-07-06 ENCOUNTER — Encounter (HOSPITAL_COMMUNITY)
Admission: RE | Disposition: A | Discharge status: Home / Self Care | Payer: Self-pay | Source: Ambulatory Visit | Attending: General Surgery

## 2015-07-06 ENCOUNTER — Ambulatory Visit (HOSPITAL_COMMUNITY): Payer: Medicaid Other | Admitting: Certified Registered Nurse Anesthetist

## 2015-07-06 ENCOUNTER — Ambulatory Visit (HOSPITAL_COMMUNITY): Payer: Medicaid Other

## 2015-07-06 ENCOUNTER — Encounter (HOSPITAL_COMMUNITY): Payer: Self-pay | Admitting: Certified Registered Nurse Anesthetist

## 2015-07-06 ENCOUNTER — Other Ambulatory Visit: Payer: Self-pay

## 2015-07-06 ENCOUNTER — Other Ambulatory Visit (HOSPITAL_COMMUNITY): Payer: Self-pay

## 2015-07-06 DIAGNOSIS — M1990 Unspecified osteoarthritis, unspecified site: Secondary | ICD-10-CM | POA: Insufficient documentation

## 2015-07-06 DIAGNOSIS — Z170 Estrogen receptor positive status [ER+]: Secondary | ICD-10-CM | POA: Diagnosis not present

## 2015-07-06 DIAGNOSIS — C50311 Malignant neoplasm of lower-inner quadrant of right female breast: Secondary | ICD-10-CM | POA: Insufficient documentation

## 2015-07-06 DIAGNOSIS — C50911 Malignant neoplasm of unspecified site of right female breast: Secondary | ICD-10-CM | POA: Diagnosis present

## 2015-07-06 DIAGNOSIS — Z87891 Personal history of nicotine dependence: Secondary | ICD-10-CM | POA: Diagnosis not present

## 2015-07-06 DIAGNOSIS — Z419 Encounter for procedure for purposes other than remedying health state, unspecified: Secondary | ICD-10-CM

## 2015-07-06 DIAGNOSIS — Z95828 Presence of other vascular implants and grafts: Secondary | ICD-10-CM

## 2015-07-06 HISTORY — PX: PORTACATH PLACEMENT: SHX2246

## 2015-07-06 SURGERY — INSERTION, TUNNELED CENTRAL VENOUS DEVICE, WITH PORT
Anesthesia: General | Site: Chest | Laterality: Left

## 2015-07-06 MED ORDER — LACTATED RINGERS IV SOLN
INTRAVENOUS | Status: DC | PRN
  Administered 2015-07-06 (×2): via INTRAVENOUS

## 2015-07-06 MED ORDER — ONDANSETRON HCL 4 MG/2ML IJ SOLN
INTRAMUSCULAR | Status: DC | PRN
  Administered 2015-07-06: 4 mg via INTRAVENOUS

## 2015-07-06 MED ORDER — ONDANSETRON HCL 4 MG/2ML IJ SOLN
INTRAMUSCULAR | Status: AC
  Filled 2015-07-06: qty 2

## 2015-07-06 MED ORDER — PROPOFOL 10 MG/ML IV BOLUS
INTRAVENOUS | Status: DC | PRN
  Administered 2015-07-06: 200 mg via INTRAVENOUS

## 2015-07-06 MED ORDER — ROCURONIUM BROMIDE 50 MG/5ML IV SOLN
INTRAVENOUS | Status: AC
  Filled 2015-07-06: qty 1

## 2015-07-06 MED ORDER — DEXAMETHASONE SODIUM PHOSPHATE 4 MG/ML IJ SOLN
INTRAMUSCULAR | Status: DC | PRN
  Administered 2015-07-06: 8 mg via INTRAVENOUS

## 2015-07-06 MED ORDER — FENTANYL CITRATE (PF) 250 MCG/5ML IJ SOLN
INTRAMUSCULAR | Status: AC
  Filled 2015-07-06: qty 5

## 2015-07-06 MED ORDER — PHENYLEPHRINE 40 MCG/ML (10ML) SYRINGE FOR IV PUSH (FOR BLOOD PRESSURE SUPPORT)
PREFILLED_SYRINGE | INTRAVENOUS | Status: AC
  Filled 2015-07-06: qty 10

## 2015-07-06 MED ORDER — PROPOFOL 10 MG/ML IV BOLUS
INTRAVENOUS | Status: AC
  Filled 2015-07-06: qty 40

## 2015-07-06 MED ORDER — BUPIVACAINE HCL (PF) 0.25 % IJ SOLN
INTRAMUSCULAR | Status: DC | PRN
  Administered 2015-07-06: 8 mL

## 2015-07-06 MED ORDER — FENTANYL CITRATE (PF) 100 MCG/2ML IJ SOLN
INTRAMUSCULAR | Status: DC | PRN
  Administered 2015-07-06: 50 ug via INTRAVENOUS
  Administered 2015-07-06 (×2): 25 ug via INTRAVENOUS

## 2015-07-06 MED ORDER — OXYCODONE-ACETAMINOPHEN 5-325 MG PO TABS
1.0000 | ORAL_TABLET | ORAL | Status: DC | PRN
Start: 2015-07-06 — End: 2016-01-29

## 2015-07-06 MED ORDER — PHENYLEPHRINE HCL 10 MG/ML IJ SOLN
INTRAMUSCULAR | Status: DC | PRN
  Administered 2015-07-06: 80 ug via INTRAVENOUS
  Administered 2015-07-06: 40 ug via INTRAVENOUS
  Administered 2015-07-06: 80 ug via INTRAVENOUS
  Administered 2015-07-06: 40 ug via INTRAVENOUS
  Administered 2015-07-06: 80 ug via INTRAVENOUS

## 2015-07-06 MED ORDER — SODIUM CHLORIDE 0.9 % IV SOLN
INTRAVENOUS | Status: DC | PRN
  Administered 2015-07-06: 500 mL

## 2015-07-06 MED ORDER — ONDANSETRON HCL 4 MG/2ML IJ SOLN: 4.0000 mg | Freq: Once | INTRAMUSCULAR | Status: DC | PRN

## 2015-07-06 MED ORDER — LACTATED RINGERS IV SOLN
INTRAVENOUS | Status: DC
  Administered 2015-07-06: 10:00:00 via INTRAVENOUS

## 2015-07-06 MED ORDER — HEPARIN SOD (PORK) LOCK FLUSH 100 UNIT/ML IV SOLN
INTRAVENOUS | Status: DC | PRN
  Administered 2015-07-06: 5 [IU]

## 2015-07-06 MED ORDER — 0.9 % SODIUM CHLORIDE (POUR BTL) OPTIME
TOPICAL | Status: DC | PRN
  Administered 2015-07-06: 1000 mL

## 2015-07-06 MED ORDER — MIDAZOLAM HCL 2 MG/2ML IJ SOLN
INTRAMUSCULAR | Status: AC
  Filled 2015-07-06: qty 2

## 2015-07-06 MED ORDER — BUPIVACAINE HCL (PF) 0.25 % IJ SOLN
INTRAMUSCULAR | Status: AC
  Filled 2015-07-06: qty 30

## 2015-07-06 MED ORDER — HEPARIN SOD (PORK) LOCK FLUSH 100 UNIT/ML IV SOLN
INTRAVENOUS | Status: AC
  Filled 2015-07-06: qty 5

## 2015-07-06 MED ORDER — FENTANYL CITRATE (PF) 100 MCG/2ML IJ SOLN: 25.0000 ug | INTRAMUSCULAR | Status: DC | PRN

## 2015-07-06 MED ORDER — LIDOCAINE HCL (CARDIAC) 20 MG/ML IV SOLN
INTRAVENOUS | Status: DC | PRN
  Administered 2015-07-06: 100 mg via INTRAVENOUS

## 2015-07-06 MED ORDER — MIDAZOLAM HCL 5 MG/5ML IJ SOLN
INTRAMUSCULAR | Status: DC | PRN
  Administered 2015-07-06: 2 mg via INTRAVENOUS

## 2015-07-06 MED ORDER — DEXAMETHASONE SODIUM PHOSPHATE 4 MG/ML IJ SOLN
INTRAMUSCULAR | Status: AC
  Filled 2015-07-06: qty 2

## 2015-07-06 SURGICAL SUPPLY — 46 items
BAG DECANTER FOR FLEXI CONT (MISCELLANEOUS) ×3 IMPLANT
BLADE SURG 15 STRL LF DISP TIS (BLADE) ×1 IMPLANT
BLADE SURG 15 STRL SS (BLADE) ×2
CHLORAPREP W/TINT 10.5 ML (MISCELLANEOUS) ×3 IMPLANT
COVER SURGICAL LIGHT HANDLE (MISCELLANEOUS) ×3 IMPLANT
COVER TRANSDUCER ULTRASND GEL (DRAPE) IMPLANT
CRADLE DONUT ADULT HEAD (MISCELLANEOUS) ×3 IMPLANT
DRAPE C-ARM 42X72 X-RAY (DRAPES) ×3 IMPLANT
DRAPE CHEST BREAST 15X10 FENES (DRAPES) ×3 IMPLANT
DRAPE UTILITY XL STRL (DRAPES) ×6 IMPLANT
ELECT CAUTERY BLADE 6.4 (BLADE) ×3 IMPLANT
ELECT REM PT RETURN 9FT ADLT (ELECTROSURGICAL) ×3
ELECTRODE REM PT RTRN 9FT ADLT (ELECTROSURGICAL) ×1 IMPLANT
GAUZE SPONGE 4X4 16PLY XRAY LF (GAUZE/BANDAGES/DRESSINGS) ×3 IMPLANT
GEL ULTRASOUND 20GR AQUASONIC (MISCELLANEOUS) IMPLANT
GLOVE BIO SURGEON STRL SZ7 (GLOVE) ×3 IMPLANT
GLOVE BIO SURGEON STRL SZ7.5 (GLOVE) ×9 IMPLANT
GOWN STRL REUS W/ TWL LRG LVL3 (GOWN DISPOSABLE) ×2 IMPLANT
GOWN STRL REUS W/TWL LRG LVL3 (GOWN DISPOSABLE) ×4
INTRODUCER COOK 11FR (CATHETERS) IMPLANT
KIT BASIN OR (CUSTOM PROCEDURE TRAY) ×3 IMPLANT
KIT PORT POWER 8FR ISP CVUE (Catheter) ×3 IMPLANT
KIT ROOM TURNOVER OR (KITS) ×3 IMPLANT
LIQUID BAND (GAUZE/BANDAGES/DRESSINGS) ×3 IMPLANT
NEEDLE 22X1 1/2 (OR ONLY) (NEEDLE) IMPLANT
NEEDLE HYPO 25GX1X1/2 BEV (NEEDLE) ×3 IMPLANT
NS IRRIG 1000ML POUR BTL (IV SOLUTION) ×3 IMPLANT
PACK SURGICAL SETUP 50X90 (CUSTOM PROCEDURE TRAY) ×3 IMPLANT
PAD ARMBOARD 7.5X6 YLW CONV (MISCELLANEOUS) ×3 IMPLANT
PENCIL BUTTON HOLSTER BLD 10FT (ELECTRODE) ×3 IMPLANT
SET INTRODUCER 12FR PACEMAKER (SHEATH) IMPLANT
SET SHEATH INTRODUCER 10FR (MISCELLANEOUS) IMPLANT
SHEATH COOK PEEL AWAY SET 9F (SHEATH) IMPLANT
SUT MNCRL AB 4-0 PS2 18 (SUTURE) ×3 IMPLANT
SUT PROLENE 2 0 SH 30 (SUTURE) ×6 IMPLANT
SUT PROLENE 2 0 SH DA (SUTURE) ×3 IMPLANT
SUT SILK 2 0 (SUTURE)
SUT SILK 2-0 18XBRD TIE 12 (SUTURE) IMPLANT
SUT VIC AB 3-0 SH 27 (SUTURE) ×4
SUT VIC AB 3-0 SH 27XBRD (SUTURE) ×2 IMPLANT
SYR 20ML ECCENTRIC (SYRINGE) ×6 IMPLANT
SYR 5ML LUER SLIP (SYRINGE) ×3 IMPLANT
SYR CONTROL 10ML LL (SYRINGE) ×3 IMPLANT
SYRINGE 10CC LL (SYRINGE) IMPLANT
TOWEL OR 17X24 6PK STRL BLUE (TOWEL DISPOSABLE) ×3 IMPLANT
TOWEL OR 17X26 10 PK STRL BLUE (TOWEL DISPOSABLE) ×3 IMPLANT

## 2015-07-06 NOTE — Anesthesia Postprocedure Evaluation (Signed)
Anesthesia Post Note  Patient: Donivan Scull  Procedure(s) Performed: Procedure(s) (LRB): INSERTION PORT-A-CATH (Left)  Patient location during evaluation: PACU Anesthesia Type: General Level of consciousness: awake and alert Pain management: pain level controlled Vital Signs Assessment: post-procedure vital signs reviewed and stable Respiratory status: spontaneous breathing, nonlabored ventilation, respiratory function stable and patient connected to nasal cannula oxygen Cardiovascular status: blood pressure returned to baseline and stable Postop Assessment: no signs of nausea or vomiting Anesthetic complications: no    Last Vitals:  Filed Vitals:   07/06/15 1145 07/06/15 1152  BP: 111/80 112/85  Pulse: 73 72  Temp:  36.7 C  Resp: 15 16    Last Pain:  Filed Vitals:   07/06/15 1216  PainSc: 0-No pain                 Zenaida Deed

## 2015-07-06 NOTE — Anesthesia Preprocedure Evaluation (Addendum)
Anesthesia Evaluation  Patient identified by MRN, date of birth, ID band Patient awake    Reviewed: Allergy & Precautions, NPO status , Patient's Chart, lab work & pertinent test results  Airway Mallampati: II  TM Distance: >3 FB Neck ROM: Full    Dental no notable dental hx. (+) Dental Advisory Given   Pulmonary former smoker,    Pulmonary exam normal        Cardiovascular Normal cardiovascular exam Rhythm:Regular Rate:Normal     Neuro/Psych    GI/Hepatic   Endo/Other  Morbid obesity  Renal/GU      Musculoskeletal  (+) Arthritis ,   Abdominal   Peds  Hematology   Anesthesia Other Findings   Reproductive/Obstetrics                            Anesthesia Physical Anesthesia Plan  ASA: II  Anesthesia Plan: General   Post-op Pain Management:    Induction: Intravenous  Airway Management Planned: LMA  Additional Equipment:   Intra-op Plan:   Post-operative Plan: Extubation in OR  Informed Consent: I have reviewed the patients History and Physical, chart, labs and discussed the procedure including the risks, benefits and alternatives for the proposed anesthesia with the patient or authorized representative who has indicated his/her understanding and acceptance.   Dental advisory given  Plan Discussed with: CRNA, Anesthesiologist and Surgeon  Anesthesia Plan Comments:        Anesthesia Quick Evaluation

## 2015-07-06 NOTE — Interval H&P Note (Signed)
History and Physical Interval Note:  07/06/2015 8:25 AM  Phyllis Wiggins  has presented today for surgery, with the diagnosis of Right BREAST CANCER  The various methods of treatment have been discussed with the patient and family. After consideration of risks, benefits and other options for treatment, the patient has consented to  Procedure(s): INSERTION PORT-A-CATH (N/A) as a surgical intervention .  The patient's history has been reviewed, patient examined, no change in status, stable for surgery.  I have reviewed the patient's chart and labs.  Questions were answered to the patient's satisfaction.     TOTH III,Alyscia Carmon S

## 2015-07-06 NOTE — H&P (Signed)
Phyllis Wiggins 06/28/2015 7:55 AM Location: [facility] Patient #: [id number] DOB: 03/08/1971 Undefined / Language: Phyllis Wiggins / Race: White Female   History of Present Illness Sammuel Hines. [name] MD; 06/28/2015 12:20 PM) The patient is a 44 year old female who presents with breast cancer. We are asked to see the patient in consultation by Dr. [name] to evaluate her for a right breast cancer. The patient is a 44 year old white female who felt a new mass in her right lower inner quadrant of her breast about 3 months ago. She brought this to her doctor's attention and she was evaluated. She was found to have a 2 cm mass in the subareolar lower inner quadrant of the right breast that was biopsied and came back as an invasive breast cancer. It was a grade 3 cancer. She was ER and PR negative and HER-2 positive with a Ki-67 of 40%. She also has a stable fibroadenoma in the upper outer portion of the right breast. She denies any breast pain or discharge from the nipple. She does not take any hormone replacement.   Other Problems [name], RN; 06/28/2015 7:55 AM) Back Pain Breast Cancer General anesthesia - complications Lump In Breast  Past Surgical History [name], RN; 06/28/2015 7:55 AM) Breast Biopsy Right. multiple Oral Surgery  Diagnostic Studies History [name], RN; 06/28/2015 7:55 AM) Colonoscopy never Mammogram within last year Pap Smear 1-5 years ago  Medication History [name], RN; 06/28/2015 7:55 AM) No Current Medications Medications Reconciled  Social History [name], RN; 06/28/2015 7:55 AM) Alcohol use Occasional alcohol use. Caffeine use Carbonated beverages, Coffee, Tea. No drug use Tobacco use Former smoker.  Family History [name], RN; 06/28/2015 7:55 AM) Alcohol Abuse Father. Arthritis Family Members In General, Mother. Cancer Family Members In General. Depression Mother. Diabetes Mellitus Family Members  In General, Mother, Sister. Ischemic Bowel Disease Mother. Migraine Headache Mother. Thyroid problems Sister.  Pregnancy / Birth History [name], RN; 06/28/2015 7:55 AM) Age at menarche 78 years. Contraceptive History Depo-provera, Oral contraceptives. Gravida 1 Irregular periods Maternal age 33-20 Para 0    Review of Systems [name] RN; 06/28/2015 7:55 AM) General Present- Night Sweats. Not Present- Appetite Loss, Chills, Fatigue, Fever, Weight Gain and Weight Loss. Skin Present- Change in Wart/Mole. Not Present- Dryness, Hives, Jaundice, New Lesions, Non-Healing Wounds, Rash and Ulcer. HEENT Present- Hearing Loss and Wears glasses/contact lenses. Not Present- Earache, Hoarseness, Nose Bleed, Oral Ulcers, Ringing in the Ears, Seasonal Allergies, Sinus Pain, Sore Throat, Visual Disturbances and Yellow Eyes. Respiratory Present- Snoring. Not Present- Bloody sputum, Chronic Cough, Difficulty Breathing and Wheezing. Breast Present- Breast Mass and Breast Pain. Not Present- Nipple Discharge and Skin Changes. Cardiovascular Present- Leg Cramps. Not Present- Chest Pain, Difficulty Breathing Lying Down, Palpitations, Rapid Heart Rate, Shortness of Breath and Swelling of Extremities. Gastrointestinal Not Present- Abdominal Pain, Bloating, Bloody Stool, Change in Bowel Habits, Chronic diarrhea, Constipation, Difficulty Swallowing, Excessive gas, Gets full quickly at meals, Hemorrhoids, Indigestion, Nausea, Rectal Pain and Vomiting. Female Genitourinary Not Present- Frequency, Nocturia, Painful Urination, Pelvic Pain and Urgency. Musculoskeletal Not Present- Back Pain, Joint Pain, Joint Stiffness, Muscle Pain, Muscle Weakness and Swelling of Extremities. Neurological Not Present- Decreased Memory, Fainting, Headaches, Numbness, Seizures, Tingling, Tremor, Trouble walking and Weakness. Endocrine Present- Heat Intolerance. Not Present- Cold Intolerance, Excessive Hunger, Hair Changes,  Hot flashes and New Diabetes. Hematology Not Present- Easy Bruising, Excessive bleeding, Gland problems, HIV and Persistent Infections.   Physical Exam Eddie Dibbles S. [name] MD;  06/28/2015 12:22 PM) General Mental Status-Alert. General Appearance-Consistent with stated age. Hydration-Well hydrated. Voice-Normal.  Head and Neck Head-normocephalic, atraumatic with no lesions or palpable masses. Trachea-midline. Thyroid Gland Characteristics - normal size and consistency.  Eye Eyeball - Bilateral-Extraocular movements intact. Sclera/Conjunctiva - Bilateral-No scleral icterus.  Chest and Lung Exam Chest and lung exam reveals -quiet, even and easy respiratory effort with no use of accessory muscles and on auscultation, normal breath sounds, no adventitious sounds and normal vocal resonance. Inspection Chest Wall - Normal. Back - normal.  Breast Note: There is a 2 cm palpable mass in the upper outer quadrant of the right breast corresponding to the fibroadenoma. There is also a 2 cm palpable mass in the lower inner quadrant subareolar area that corresponds to her known cancer. There is no palpable mass in the left breast. There is no palpable axillary, supraclavicular, or cervical lymphadenopathy.   Cardiovascular Cardiovascular examination reveals -normal heart sounds, regular rate and rhythm with no murmurs and normal pedal pulses bilaterally.  Abdomen Inspection Inspection of the abdomen reveals - No Hernias. Skin - Scar - no surgical scars. Palpation/Percussion Palpation and Percussion of the abdomen reveal - Soft, Non Tender, No Rebound tenderness, No Rigidity (guarding) and No hepatosplenomegaly. Auscultation Auscultation of the abdomen reveals - Bowel sounds normal.  Neurologic Neurologic evaluation reveals -alert and oriented x 3 with no impairment of recent or remote memory. Mental Status-Normal.  Musculoskeletal Normal Exam - Left-Upper Extremity  Strength Normal and Lower Extremity Strength Normal. Normal Exam - Right-Upper Extremity Strength Normal and Lower Extremity Strength Normal.  Lymphatic Head & Neck  General Head & Neck Lymphatics: Bilateral - Description - Normal. Axillary  General Axillary Region: Bilateral - Description - Normal. Tenderness - Non Tender. Femoral & Inguinal  Generalized Femoral & Inguinal Lymphatics: Bilateral - Description - Normal. Tenderness - Non Tender.    Assessment & Plan Eddie Dibbles S. [name] MD; 06/28/2015 12:23 PM) BREAST CANCER OF LOWER-INNER QUADRANT OF RIGHT FEMALE BREAST (C50.311) Impression: The patient has a 2 cm cancer in the lower inner subareolar right breast. Because of its proximity to the nipple and because it is HER-2 positive we feel as though she would benefit from neoadjuvant therapy. She is in agreement with this. She will need a Port-A-Cath. I have discussed with her in detail the risks and benefits of the operation a place the Port-A-Cath as well as some of the technical aspects and she understands and wishes to proceed. I will plan to see her back monthly to monitor her progress. She will also get genetics during this time. Current Plans Follow up in 1 month or as needed   Signed by Luella Cook, MD (06/28/2015 12:24 PM)

## 2015-07-06 NOTE — Anesthesia Procedure Notes (Signed)
Procedure Name: LMA Insertion Date/Time: 07/06/2015 9:53 AM Performed by: Garrison [place] T Pre-anesthesia Checklist: Patient identified, Emergency Drugs available, Suction available and Patient being monitored Patient Re-evaluated:Patient Re-evaluated prior to inductionOxygen Delivery Method: Circle system utilized Preoxygenation: Pre-oxygenation with 100% oxygen Intubation Type: IV induction LMA: LMA inserted LMA Size: 4.0 Number of attempts: 1 Placement Confirmation: positive ETCO2 and breath sounds checked- equal and bilateral Tube secured with: Tape Dental Injury: Teeth and Oropharynx as per pre-operative assessment

## 2015-07-06 NOTE — Progress Notes (Signed)
Pt reports last menstrual cycle was last week and denies pregnancy, okay to discontinue serum pregnancy test.

## 2015-07-06 NOTE — Transfer of Care (Signed)
Immediate Anesthesia Transfer of Care Note  Patient: Phyllis Wiggins  Procedure(s) Performed: Procedure(s): INSERTION PORT-A-CATH (Left)  Patient Location: PACU  Anesthesia Type:General  Level of Consciousness: awake, alert  and oriented  Airway & Oxygen Therapy: Patient Spontanous Breathing  Post-op Assessment: Report given to RN, Post -op Vital signs reviewed and stable and Patient moving all extremities X 4  Post vital signs: Reviewed and stable  Last Vitals:  Filed Vitals:   07/06/15 0912  BP: 118/81  Pulse: 87  Temp: 36.7 C  Resp: 18    Complications: No apparent anesthesia complications

## 2015-07-06 NOTE — Op Note (Signed)
07/06/2015  10:41 AM  PATIENT:  Phyllis Wiggins  44 y.o. female  PRE-OPERATIVE DIAGNOSIS:  Right BREAST CANCER  POST-OPERATIVE DIAGNOSIS:  Right BREAST CANCER  PROCEDURE:  Procedure(s): INSERTION PORT-A-CATH (Left)  SURGEON:  Surgeon(s) and Role:    * Jovita Kussmaul, MD - Primary  PHYSICIAN ASSISTANT:   ASSISTANTS: none   ANESTHESIA:   general  EBL:  Total I/O In: 1000 [I.V.:1000] Out: -   BLOOD ADMINISTERED:none  DRAINS: none   LOCAL MEDICATIONS USED:  MARCAINE     SPECIMEN:  No Specimen  DISPOSITION OF SPECIMEN:  N/A  COUNTS:  YES  TOURNIQUET:  * No tourniquets in log *  DICTATION: .Dragon Dictation   After informed consent was obtained the patient was brought to the operating room and placed in the supine position on the operating table. After adequate induction of general anesthesia, a roll was placed between the patient's shoulder blades to extend the shoulder slightly. The left chest and neck area were then prepped with ChloraPrep, allowed to dry, and draped in usual sterile manner. The patient was placed in Trendelenburg position. The area lateral to the bend of the clavicle and the left chest was infiltrated with quarter percent Marcaine. A small incision was made with a 15 blade knife. A large bore needle from the Port-A-Cath kit was used to slide beneath the bend of the clavicle heading towards the sternal notch and in doing so we were readily able to access the left subclavian vein. A wire was fed through the needle without difficulty using the Seldinger technique. The wire was confirmed in the central venous system using real-time fluoroscopy. Next the incision was extended slightly. A subcutaneous pocket was created inferior to the incision by a combination of blunt finger dissection and some sharp dissection with the electrocautery. Next the tubing was placed on the reservoir and the reservoir was placed in the pocket. The length of the tubing was estimated using  real-time fluoroscopy. The tubing was cut to the appropriate length. Next a sheath and dilator were fed over the wire also using the Seldinger technique without difficulty. The dilator and wire were removed. The tubing was fed through the sheath as far as it would go and held in place while the sheath was gently cracked and separated. The tip of the catheter appeared to be in the right atrium so about 1 cm of the tubing was trimmed off and the tubing was reattached to the reservoir. The tip of the catheter now seemed to be in the distal superior vena cava. The reservoir was anchored in the pocket with 2 2-0 Prolene stitches. The tubing was permanently attached to the reservoir. The port was aspirated and it aspirated blood easily. The port was then flushed initially with a dilute heparin solution and then with the more concentrated heparin solution. The subcutaneous tissue was closed over the port with interrupted 3-0 Vicryl stitches. The skin was then closed with a running 4-0 Monocryl subcuticular stitch. Dermabond dressings were applied. Patient tolerated the procedure well. At the end of the case all needle sponge and instrument counts were correct. The patient was then awakened and taken to recovery in stable condition.  PLAN OF CARE: Discharge to home after PACU  PATIENT DISPOSITION:  PACU - hemodynamically stable.   Delay start of Pharmacological VTE agent (>24hrs) due to surgical blood loss or risk of bleeding: not applicable

## 2015-07-07 ENCOUNTER — Telehealth: Payer: Self-pay | Admitting: Oncology

## 2015-07-07 ENCOUNTER — Encounter: Payer: Self-pay | Admitting: Oncology

## 2015-07-07 ENCOUNTER — Ambulatory Visit (HOSPITAL_BASED_OUTPATIENT_CLINIC_OR_DEPARTMENT_OTHER): Payer: Medicaid Other | Admitting: Nurse Practitioner

## 2015-07-07 ENCOUNTER — Ambulatory Visit (HOSPITAL_BASED_OUTPATIENT_CLINIC_OR_DEPARTMENT_OTHER)
Admission: RE | Admit: 2015-07-07 | Discharge: 2015-07-07 | Disposition: A | Discharge status: Home / Self Care | Payer: Medicaid Other | Source: Ambulatory Visit | Attending: Oncology | Admitting: Oncology

## 2015-07-07 ENCOUNTER — Encounter (HOSPITAL_COMMUNITY): Payer: Self-pay | Admitting: General Surgery

## 2015-07-07 ENCOUNTER — Other Ambulatory Visit: Payer: Self-pay

## 2015-07-07 VITALS — BP 126/65 | HR 70 | Temp 97.8°F | Resp 18 | Ht 67.0 in | Wt 231.5 lb

## 2015-07-07 DIAGNOSIS — C50511 Malignant neoplasm of lower-outer quadrant of right female breast: Secondary | ICD-10-CM

## 2015-07-07 DIAGNOSIS — Z23 Encounter for immunization: Secondary | ICD-10-CM

## 2015-07-07 DIAGNOSIS — Z171 Estrogen receptor negative status [ER-]: Secondary | ICD-10-CM | POA: Diagnosis not present

## 2015-07-07 DIAGNOSIS — M722 Plantar fascial fibromatosis: Secondary | ICD-10-CM

## 2015-07-07 DIAGNOSIS — C50311 Malignant neoplasm of lower-inner quadrant of right female breast: Secondary | ICD-10-CM | POA: Diagnosis not present

## 2015-07-07 MED ORDER — ONDANSETRON HCL 8 MG PO TABS: 8.0000 mg | ORAL_TABLET | Freq: Two times a day (BID) | ORAL | Status: DC

## 2015-07-07 MED ORDER — LIDOCAINE-PRILOCAINE 2.5-2.5 % EX CREA: TOPICAL_CREAM | CUTANEOUS | Status: DC

## 2015-07-07 MED ORDER — PROCHLORPERAZINE MALEATE 10 MG PO TABS: 10.0000 mg | ORAL_TABLET | Freq: Four times a day (QID) | ORAL | Status: DC | PRN

## 2015-07-07 MED ORDER — DEXAMETHASONE 4 MG PO TABS: 8.0000 mg | ORAL_TABLET | Freq: Two times a day (BID) | ORAL | Status: DC

## 2015-07-07 MED ORDER — LORAZEPAM 0.5 MG PO TABS: 0.5000 mg | ORAL_TABLET | Freq: Every day | ORAL | Status: DC

## 2015-07-07 MED ORDER — INFLUENZA VAC SPLIT QUAD 0.5 ML IM SUSY
0.5000 mL | PREFILLED_SYRINGE | Freq: Once | INTRAMUSCULAR | Status: AC
  Administered 2015-07-07: 0.5 mL via INTRAMUSCULAR
  Filled 2015-07-07: qty 0.5

## 2015-07-07 NOTE — Telephone Encounter (Signed)
Gave patient avs report and appointments for December and January  °

## 2015-07-07 NOTE — Telephone Encounter (Signed)
Add to previous note. Per HB date f/u date for 1/13 per 12/2 pof should be 1/3.

## 2015-07-07 NOTE — Progress Notes (Signed)
[name]  [phone] [phone]     ID: Phyllis Wiggins DOB: 01-May-1971  MR#: [id number]  IRW#:[id number]  Patient Care Team: Mora Bellman, MD as PCP - General (Obstetrics and Gynecology) Autumn Messing III, MD as Consulting Physician (General Surgery) Chauncey Cruel, MD as Consulting Physician (Oncology) [name], MD as Consulting Physician (Radiation Oncology) Sylvan Cheese, NP as Nurse Practitioner (Hematology and Oncology) PCP: Mora Bellman, MD GYN: OTHER MD:  CHIEF COMPLAINT: HER-2 positive, estrogen and progesterone receptor negative breast cancer  CURRENT TREATMENT: Neoadjuvant chemotherapy/immunotherapy   BREAST CANCER HISTORY: [name] noted a change in her right breast sometime in September 2016. She brought it to medical attention when she presented for the free Pap smear screening clinic here 06/01/2015. Exam on that day showed a lump in the right breast at the 4:00 position under the areola. There was also a scar on the right outer breast. This scar was noted in 01/04/2008 when she had her baseline mammogram at [facility]. It had been stable for a year at that time and the patient had treated it with a "blood root paste" which caused scarring. 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 It is in a separate location from the new mass.  On 06/05/2015 Phyllis Wiggins underwent bilateral diagnostic mammography at [facility], with bilateral ultrasonography. This found the breast density to be category B. There was a 2.5 cm oval mass in the right breast at the 5:00 position which by ultrasound measured 2.1 cm. In the left breast there was an area of focal asymmetry which by ultrasound was consistent with fibroglandular tissue/benign.  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  Her subsequent history is as detailed below.  INTERVAL HISTORY: Phyllis Wiggins returns for follow up of her breast cancer, accompanied by her mother [name] and her sister [name]. She had her port placed yesterday with no complications. She is managing the pain with tylenol alone. She is here today to discuss her antiemetic schedule.   REVIEW OF SYSTEMS: The patient denies unusual headaches, visual changes, nausea, vomiting, stiff neck, dizziness, or gait imbalance. There has been no cough, phlegm production, or pleurisy, no chest pain or pressure, and no change in bowel or bladder habits. The patient denies fever, rash, bleeding, unexplained fatigue or unexplained weight loss. Phyllis Wiggins does complain of hot flashes and night sweats. She has some perimenopausal symptoms including fatigue. She has some heartburn issues. She has plantar fasciitis. A detailed review of systems was otherwise entirely negative.    Marland Kitchen PAST MEDICAL HISTORY: Past Medical History  Diagnosis Date  . Breast cancer of lower-outer quadrant of right female breast ([place]) 06/20/2015  . Breast cancer ([place])   . Arthritis     PAST SURGICAL HISTORY: Past Surgical History  Procedure Laterality Date  . Wisdom tooth extraction    . Portacath placement Left 07/06/2015    Procedure: INSERTION PORT-A-CATH;  Surgeon: Autumn Messing III, MD;  Location: [facility] OR;  Service: General;  Laterality: Left;    FAMILY HISTORY Family History  Problem Relation Age of Onset  . Diabetes Mother   . Hypertension Mother   . Hyperlipidemia Mother   . Cancer Maternal Grandfather   . Diabetes Paternal Grandmother    the patient's parents are  both living, in their early 28s. The patient had no brother, one sister. The patient's mother works by [employer] administrator [employer] for private clients. The patient's sister is a Pharmacist, hospital in [place] there is no history of breast or ovarian cancer  in the family. The patient's paternal grandfather died at the age of 5 from stomach cancer felt to be related to iron unusual toxic exposure   GYNECOLOGIC HISTORY:  Patient's last menstrual period was 06/26/2015.  menarche age 32, she is [place] P0  she is still having periods but they're quite sporadic. They may occur 6 weeks apart or 3 weeks apart, may be heavy or only spotting. 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 She is divorced and at home lives with 6 dogs.     ADVANCED DIRECTIVES:  not in place    HEALTH MAINTENANCE: Social History  Substance Use Topics  . Smoking status: Former Smoker    Quit date: 07/04/2010  . Smokeless tobacco: Never Used  . Alcohol Use: No     Colonoscopy:  PAP: October 2016   Bone density:  Lipid panel:  Allergies  Allergen Reactions  . Cheese   . Dairy Aid [Lactase]     "sinus infections"  . Whey     Sinus issues  . Wine [Alcohol]     Severe sinus issues due to fermentation    No current facility-administered medications for this visit.   Current Outpatient Prescriptions  Medication Sig Dispense Refill  . dexamethasone (DECADRON) 4 MG tablet Take 2 tablets (8 mg total) by mouth 2 (two) times daily. Start the day before Taxotere. Then again the day after chemo for 3 days. 30 tablet 1  . lidocaine-prilocaine (EMLA) cream Apply to affected area once 30 g 3  . LORazepam (ATIVAN) 0.5 MG tablet Take 1 tablet (0.5 mg total) by mouth at bedtime. 30 tablet 0  . ondansetron (ZOFRAN) 8 MG tablet Take 1 tablet (8 mg total) by mouth 2 (two) times daily. Start the day after chemo for 3 days. Then take as needed for nausea or vomiting. 30 tablet 1  . oxyCODONE-acetaminophen (ROXICET) 5-325 MG tablet Take 1-2 tablets by mouth every 4 (four) hours as needed. (Patient not taking: Reported on  07/07/2015) 50 tablet 0  . prochlorperazine (COMPAZINE) 10 MG tablet Take 1 tablet (10 mg total) by mouth every 6 (six) hours as needed (Nausea or vomiting). 30 tablet 1   Facility-Administered Medications Ordered in Other Visits  Medication Dose Route Frequency Provider Last Rate Last Dose  . chlorhexidine (HIBICLENS) 4 % liquid 1 application  1 application Topical Once Autumn Messing III, MD      . chlorhexidine (HIBICLENS) 4 % liquid 1 application  1 application Topical Once Autumn Messing III, MD      . fentaNYL (SUBLIMAZE) injection 25-50 mcg  25-50 mcg Intravenous Q5 min PRN Jillyn Hidden, MD      . lactated ringers infusion   Intravenous Continuous Jillyn Hidden, MD 10 mL/hr at 07/06/15 [phone]    . ondansetron (ZOFRAN) injection 4 mg  4 mg Intravenous Once PRN Jillyn Hidden, MD        OBJECTIVE: Middle-aged white woman in no acute distress  Filed Vitals:   07/07/15 1501  BP: 126/65  Pulse: 70  Temp: 97.8 F (36.6 C)  Resp: 18     Body  mass index is 36.25 kg/(m^2).    ECOG FS:0 - Asymptomatic   Skin: warm, dry  HEENT: sclerae anicteric, conjunctivae pink, oropharynx clear. No thrush or mucositis.  Lymph Nodes: No cervical or supraclavicular lymphadenopathy  Lungs: clear to auscultation bilaterally, no rales, wheezes, or rhonci  Heart: regular rate and rhythm  Abdomen: round, soft, non tender, positive bowel sounds  Musculoskeletal: No focal spinal tenderness, no peripheral edema  Neuro: non focal, well oriented, positive affect  Breasts: deferred. Left chest port a cath recently placed, mildly bruised. Incision line clean, dry, and well approximated.   LAB RESULTS:  CMP     Component Value Date/Time   NA 139 06/28/2015 0818   K 4.0 06/28/2015 0818   CO2 27 06/28/2015 0818   GLUCOSE 112 06/28/2015 0818   BUN 12.2 06/28/2015 0818   CREATININE 0.9 06/28/2015 0818   CALCIUM 9.6 06/28/2015 0818   PROT 7.3 06/28/2015 0818   ALBUMIN 3.8 06/28/2015 0818   AST 15 06/28/2015 0818   ALT  14 06/28/2015 0818   ALKPHOS 57 06/28/2015 0818   BILITOT 0.51 06/28/2015 0818    INo results found for: SPEP, UPEP  Lab Results  Component Value Date   WBC 7.7 06/28/2015   NEUTROABS 4.9 06/28/2015   HGB 13.9 06/28/2015   HCT 41.4 06/28/2015   MCV 89.1 06/28/2015   PLT 337 06/28/2015      Chemistry      Component Value Date/Time   NA 139 06/28/2015 0818   K 4.0 06/28/2015 0818   CO2 27 06/28/2015 0818   BUN 12.2 06/28/2015 0818   CREATININE 0.9 06/28/2015 0818      Component Value Date/Time   CALCIUM 9.6 06/28/2015 0818   ALKPHOS 57 06/28/2015 0818   AST 15 06/28/2015 0818   ALT 14 06/28/2015 0818   BILITOT 0.51 06/28/2015 0818       No results found for: LABCA2  No components found for: LABCA125  No results for input(s): INR in the last 168 hours.  Urinalysis No results found for: COLORURINE, APPEARANCEUR, LABSPEC, PHURINE, GLUCOSEU, HGBUR, BILIRUBINUR, KETONESUR, PROTEINUR, UROBILINOGEN, NITRITE, LEUKOCYTESUR  STUDIES: Mr Breast Bilateral W Wo Contrast  06/21/2015  CLINICAL DATA:  44 year old female who presented with a palpable right breast mass. The mass in the lower-inner right breast has been subsequently biopsied demonstrating malignancy, which may represent "a primary invasive mammary carcinoma or metastatic disease within a lymph node". LABS:  No recent laboratory values. Correlation made with prior mammography, the most recent from 06/05/2015. EXAM: BILATERAL BREAST MRI WITH AND WITHOUT CONTRAST TECHNIQUE: Multiplanar, multisequence MR images of both breasts were obtained prior to and following the intravenous administration of 20 ml of MultiHance. THREE-DIMENSIONAL MR IMAGE RENDERING ON INDEPENDENT WORKSTATION: Three-dimensional MR images were rendered by post-processing of the original MR data on an independent workstation. The three-dimensional MR images were interpreted, and findings are reported in the following complete MRI report for this study. Three  dimensional images were evaluated at the independent DynaCad workstation COMPARISON:  No prior MRIs available for comparison. FINDINGS: Breast composition: b. Scattered fibroglandular tissue. Background parenchymal enhancement: Marked. Right breast: There is marked background parenchymal foci of enhancement similar to the contralateral breast. In the lower-inner quadrant of the right breast, anterior depth there is an oval avidly enhancing mass with washout kinetics measuring 1.9 x 1.6 x 1.7 cm. A focus of susceptibility seen along the inferolateral aspect of the mass, compatible with a biopsy marking clip. In the upper-outer quadrant of  the right breast, there is a heterogeneously enhancing circumscribed oval mass measuring 3.3 x 2.4 cm in axial plane. 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 Just medial to the superior edge of this suspected fibroadenoma, is a suspicious 0.9 x 0.8 x 0.8 cm round enhancing mass at 12 o'clock, anterior depth (series 7, image 67). Left breast: Though there is marked background parenchymal foci of enhancement similar to the contralateral breast, there is no suspicious enhancing mass or abnormal non mass enhancement. Lymph nodes: No abnormal appearing lymph nodes. Ancillary findings:  None. IMPRESSION: 1. The biopsy-proven malignancy in the lower-inner quadrant of the right breast measures up to 1.9 cm. 2. There is a suspicious 0.9 cm enhancing mass at 12 o'clock just adjacent to the larger benign mass in the right breast. 3.  No MRI evidence of malignancy in the left breast. 4. Marked background parenchymal foci of enhancement bilaterally, limiting sensitivity of MRI. RECOMMENDATION: Second-look ultrasound recommended for the 0.9 cm mass in the right breast at 12 o'clock, anterior depth. If not seen sonographically, a MRI guided biopsy will be recommended. BI-RADS CATEGORY   BI-RADS 4: Suspicious. Electronically Signed   By: Ammie Ferrier M.D.   On: 06/21/2015 14:41   Dg Chest Port 1 View  07/06/2015  CLINICAL DATA:  Status post Port-A-Cath placement. EXAM: PORTABLE CHEST 1 VIEW COMPARISON:  None. FINDINGS: The heart size and mediastinal contours are within normal limits. Both lungs are clear. Left subclavian Port-A-Cath is noted with distal tip in expected position of the SVC. No pneumothorax or pleural effusion is noted. The visualized skeletal structures are unremarkable. IMPRESSION: No pneumothorax seen status post left subclavian Port-A-Cath placement. Electronically Signed   By: Marijo Conception, M.D.   On: 07/06/2015 11:30   Dg Fluoro Guide Cv Line-no Report  07/06/2015  CLINICAL DATA:  FLOURO GUIDE CV LINE Fluoroscopy was utilized by the requesting physician.  No radiographic interpretation.    ASSESSMENT: 44 y.o. Phyllis Wiggins woman status post left breast biopsy 06/15/2015 for a clinical T1 cN0, stage IA invasive ductal carcinoma, grade 3, estrogen and progesterone receptor negative, HER-2 amplified, with an MIB-1 of 40%  (1) neoadjuvant chemotherapy to consist of carboplatin, docetaxel, pertuzumab, and trastuzumab with onpro support every 3 weeks 6  (2) breast conserving surgery with sentinel lymph node sampling to follow  (3) adjuvant radiation to follow surgery  (4) trastuzumab to be continued to complete a year  (5) genetics testing pending  PLAN: Aaliyan, her family, and I spent about 60 minutes discussing her upcoming chemotherapy plans. She was made aware of potential side effects and toxicities of carboplatin, docetaxel, pertuzumab, and trastuzumab. She attended chemotherapy school earlier today so a lot of this information was a review for her. She was made aware of her neulasta injections, including the fact that these will be administered with an onbody injection that she will need to wear until the device is no longer active. We reviewed her  antiemetic schedule, and she feels comfortable with every medication listed as well as the indication and potential side effects. These medicines were sent to her pharmacy today.   She had some questions regarding holistic therapies and the use of herbal supplements while on chemotherapy. I advised her that ginger tea and essential oils were acceptable, but some of the oral drugs I was unfamiliar with. I told her I would check with Dr. Jana Hakim, and  she understands she is not to start any supplemental therapy without his approval.  Dashana will return on Tuesday for her first cycle of treatment. She understands and agrees with this plan. She knows the goal of treatment in her case is cure. She has been encouraged to call with any issues that might arise before her next visit here.  Laurie Panda, NP   07/07/2015 4:43 PM

## 2015-07-07 NOTE — Progress Notes (Signed)
Met with patient in person to discuss financial assistance. She was accompanied by her mother and sister. Patient brought completed application for the [name]. Approved pt for the $1,000 [name]. Pt has copy of approval as well as the covered expenses sheet. Advised pt for medications, prescriptions would have to be prescribed by our physicians and filled at our outpatient pharmacy. Pt verbalized understanding. Gave pt applications for [employer] and [facility]. Advised pt she may complete them and bring them back to me to submit or send them with all the supported documentation for processing. Pt was also given Patent examiner for [facility]. Pt has my card for any additional financial questions or concerns.

## 2015-07-07 NOTE — Progress Notes (Signed)
  Echocardiogram 2D Echocardiogram has been performed.  Phyllis Wiggins 07/07/2015, 12:14 PM

## 2015-07-10 ENCOUNTER — Other Ambulatory Visit: Payer: Self-pay

## 2015-07-10 DIAGNOSIS — C50511 Malignant neoplasm of lower-outer quadrant of right female breast: Secondary | ICD-10-CM

## 2015-07-10 DIAGNOSIS — Z5189 Encounter for other specified aftercare: Secondary | ICD-10-CM

## 2015-07-11 ENCOUNTER — Other Ambulatory Visit (HOSPITAL_BASED_OUTPATIENT_CLINIC_OR_DEPARTMENT_OTHER): Payer: Medicaid Other

## 2015-07-11 ENCOUNTER — Ambulatory Visit (HOSPITAL_BASED_OUTPATIENT_CLINIC_OR_DEPARTMENT_OTHER): Payer: Medicaid Other

## 2015-07-11 ENCOUNTER — Encounter: Payer: Self-pay | Admitting: *Deleted

## 2015-07-11 VITALS — BP 118/75 | HR 71 | Temp 98.7°F | Resp 19

## 2015-07-11 DIAGNOSIS — C50511 Malignant neoplasm of lower-outer quadrant of right female breast: Secondary | ICD-10-CM | POA: Diagnosis not present

## 2015-07-11 DIAGNOSIS — Z5112 Encounter for antineoplastic immunotherapy: Secondary | ICD-10-CM | POA: Diagnosis present

## 2015-07-11 DIAGNOSIS — Z5111 Encounter for antineoplastic chemotherapy: Secondary | ICD-10-CM | POA: Diagnosis not present

## 2015-07-11 LAB — COMPREHENSIVE METABOLIC PANEL
ALT: 36 U/L (ref 0–55)
AST: 32 U/L (ref 5–34)
Albumin: 3.8 g/dL (ref 3.5–5.0)
Alkaline Phosphatase: 54 U/L (ref 40–150)
Anion Gap: 11 mEq/L (ref 3–11)
BILIRUBIN TOTAL: 0.4 mg/dL (ref 0.20–1.20)
BUN: 19 mg/dL (ref 7.0–26.0)
CHLORIDE: 102 meq/L (ref 98–109)
CO2: 24 meq/L (ref 22–29)
CREATININE: 0.9 mg/dL (ref 0.6–1.1)
Calcium: 9.9 mg/dL (ref 8.4–10.4)
EGFR: 73 mL/min/{1.73_m2} — ABNORMAL LOW (ref >90)
GLUCOSE: 205 mg/dL — AB (ref 70–140)
Potassium: 4 mEq/L (ref 3.5–5.1)
Sodium: 137 mEq/L (ref 136–145)
TOTAL PROTEIN: 7.6 g/dL (ref 6.4–8.3)

## 2015-07-11 LAB — CBC WITH DIFFERENTIAL/PLATELET
BASO%: 0.1 % (ref 0.0–2.0)
Basophils Absolute: 0 10*3/uL (ref 0.0–0.1)
EOS ABS: 0 10*3/uL (ref 0.0–0.5)
EOS%: 0 % (ref 0.0–7.0)
HCT: 40.7 % (ref 34.8–46.6)
HEMOGLOBIN: 13.7 g/dL (ref 11.6–15.9)
LYMPH#: 1.3 10*3/uL (ref 0.9–3.3)
LYMPH%: 10.6 % — AB (ref 14.0–49.7)
MCH: 29.7 pg (ref 25.1–34.0)
MCHC: 33.7 g/dL (ref 31.5–36.0)
MCV: 88.2 fL (ref 79.5–101.0)
MONO#: 0.2 10*3/uL (ref 0.1–0.9)
MONO%: 1.9 % (ref 0.0–14.0)
NEUT%: 87.4 % — ABNORMAL HIGH (ref 38.4–76.8)
NEUTROS ABS: 10.5 10*3/uL — AB (ref 1.5–6.5)
PLATELETS: 320 10*3/uL (ref 145–400)
RBC: 4.61 10*6/uL (ref 3.70–5.45)
RDW: 12.2 % (ref 11.2–14.5)
WBC: 12 10*3/uL — AB (ref 3.9–10.3)

## 2015-07-11 MED ORDER — DEXAMETHASONE SODIUM PHOSPHATE 100 MG/10ML IJ SOLN
Freq: Once | INTRAMUSCULAR | Status: AC
  Administered 2015-07-11: 14:00:00 via INTRAVENOUS
  Filled 2015-07-11: qty 8

## 2015-07-11 MED ORDER — DIPHENHYDRAMINE HCL 25 MG PO CAPS
ORAL_CAPSULE | ORAL | Status: AC
  Filled 2015-07-11: qty 1

## 2015-07-11 MED ORDER — ACETAMINOPHEN 325 MG PO TABS
650.0000 mg | ORAL_TABLET | Freq: Once | ORAL | Status: AC
  Administered 2015-07-11: 650 mg via ORAL

## 2015-07-11 MED ORDER — SODIUM CHLORIDE 0.9 % IV SOLN
Freq: Once | INTRAVENOUS | Status: AC
  Administered 2015-07-11: 10:00:00 via INTRAVENOUS

## 2015-07-11 MED ORDER — ACETAMINOPHEN 325 MG PO TABS
ORAL_TABLET | ORAL | Status: AC
  Filled 2015-07-11: qty 2

## 2015-07-11 MED ORDER — SODIUM CHLORIDE 0.9 % IJ SOLN
10.0000 mL | INTRAMUSCULAR | Status: DC | PRN
  Administered 2015-07-11: 10 mL
  Filled 2015-07-11: qty 10

## 2015-07-11 MED ORDER — HEPARIN SOD (PORK) LOCK FLUSH 100 UNIT/ML IV SOLN
500.0000 [IU] | Freq: Once | INTRAVENOUS | Status: AC | PRN
  Administered 2015-07-11: 500 [IU]
  Filled 2015-07-11: qty 5

## 2015-07-11 MED ORDER — TRASTUZUMAB CHEMO INJECTION 440 MG
8.0000 mg/kg | Freq: Once | INTRAVENOUS | Status: AC
  Administered 2015-07-11: 819 mg via INTRAVENOUS
  Filled 2015-07-11: qty 39

## 2015-07-11 MED ORDER — DIPHENHYDRAMINE HCL 25 MG PO CAPS
25.0000 mg | ORAL_CAPSULE | Freq: Once | ORAL | Status: AC
  Administered 2015-07-11: 25 mg via ORAL

## 2015-07-11 MED ORDER — DEXTROSE 5 % IV SOLN
75.0000 mg/m2 | Freq: Once | INTRAVENOUS | Status: AC
  Administered 2015-07-11: 160 mg via INTRAVENOUS
  Filled 2015-07-11: qty 16

## 2015-07-11 MED ORDER — PEGFILGRASTIM 6 MG/0.6ML ~~LOC~~ PSKT
6.0000 mg | PREFILLED_SYRINGE | Freq: Once | SUBCUTANEOUS | Status: AC
  Administered 2015-07-11: 6 mg via SUBCUTANEOUS
  Filled 2015-07-11: qty 0.6

## 2015-07-11 MED ORDER — SODIUM CHLORIDE 0.9 % IV SOLN
840.0000 mg | Freq: Once | INTRAVENOUS | Status: AC
  Administered 2015-07-11: 840 mg via INTRAVENOUS
  Filled 2015-07-11: qty 28

## 2015-07-11 MED ORDER — SODIUM CHLORIDE 0.9 % IV SOLN
750.0000 mg | Freq: Once | INTRAVENOUS | Status: AC
  Administered 2015-07-11: 750 mg via INTRAVENOUS
  Filled 2015-07-11: qty 75

## 2015-07-11 NOTE — Patient Instructions (Addendum)
[place] Discharge Instructions for Patients Receiving Chemotherapy  Today you received the following chemotherapy agents Herceptin/Perjeta/Taxotere/Carboplatin  To help prevent nausea and vomiting after your treatment, we encourage you to take your nausea medication as needed   If you develop nausea and vomiting that is not controlled by your nausea medication, call the clinic.   BELOW ARE SYMPTOMS THAT SHOULD BE REPORTED IMMEDIATELY:  *FEVER GREATER THAN 100.5 F  *CHILLS WITH OR WITHOUT FEVER  NAUSEA AND VOMITING THAT IS NOT CONTROLLED WITH YOUR NAUSEA MEDICATION  *UNUSUAL SHORTNESS OF BREATH  *UNUSUAL BRUISING OR BLEEDING  TENDERNESS IN MOUTH AND THROAT WITH OR WITHOUT PRESENCE OF ULCERS  *URINARY PROBLEMS  *BOWEL PROBLEMS  UNUSUAL RASH Items with * indicate a potential emergency and should be followed up as soon as possible.  Feel free to call the clinic you have any questions or concerns. The clinic phone number is [phone].  Please show the Plaza at check-in to the Emergency Department and triage nurse.  Pegfilgrastim injection What is this medicine? PEGFILGRASTIM (PEG fil gra stim) is a long-acting granulocyte colony-stimulating factor that stimulates the growth of neutrophils, a type of white blood cell important in the body's fight against infection. It is used to reduce the incidence of fever and infection in patients with certain types of cancer who are receiving chemotherapy that affects the bone marrow, and to increase survival after being exposed to high doses of radiation. This medicine may be used for other purposes; ask your health care provider or pharmacist if you have questions. What should I tell my health care provider before I take this medicine? They need to know if you have any of these conditions: -kidney disease -latex allergy -ongoing radiation therapy -sickle cell disease -skin reactions to acrylic adhesives  (On-Body Injector only) -an unusual or allergic reaction to pegfilgrastim, filgrastim, other medicines, foods, dyes, or preservatives -pregnant or trying to get pregnant -breast-feeding How should I use this medicine? This medicine is for injection under the skin. If you get this medicine at home, you will be taught how to prepare and give the pre-filled syringe or how to use the On-body Injector. Refer to the patient Instructions for Use for detailed instructions. Use exactly as directed. Take your medicine at regular intervals. Do not take your medicine more often than directed. It is important that you put your used needles and syringes in a special sharps container. Do not put them in a trash can. If you do not have a sharps container, call your pharmacist or healthcare provider to get one. Talk to your pediatrician regarding the use of this medicine in children. While this drug may be prescribed for selected conditions, precautions do apply. Overdosage: If you think you have taken too much of this medicine contact a poison control center or emergency room at once. NOTE: This medicine is only for you. Do not share this medicine with others. What if I miss a dose? It is important not to miss your dose. Call your doctor or health care professional if you miss your dose. If you miss a dose due to an On-body Injector failure or leakage, a new dose should be administered as soon as possible using a single prefilled syringe for manual use. What may interact with this medicine? Interactions have not been studied. Give your health care provider a list of all the medicines, herbs, non-prescription drugs, or dietary supplements you use. Also tell them if you smoke, drink alcohol, or  use illegal drugs. Some items may interact with your medicine. This list may not describe all possible interactions. Give your health care provider a list of all the medicines, herbs, non-prescription drugs, or dietary  supplements you use. Also tell them if you smoke, drink alcohol, or use illegal drugs. Some items may interact with your medicine. What should I watch for while using this medicine? You may need blood work done while you are taking this medicine. If you are going to need a MRI, CT scan, or other procedure, tell your doctor that you are using this medicine (On-Body Injector only). What side effects may I notice from receiving this medicine? Side effects that you should report to your doctor or health care professional as soon as possible: -allergic reactions like skin rash, itching or hives, swelling of the face, lips, or tongue -dizziness -fever -pain, redness, or irritation at site where injected -pinpoint red spots on the skin -red or dark-brown urine -shortness of breath or breathing problems -stomach or side pain, or pain at the shoulder -swelling -tiredness -trouble passing urine or change in the amount of urine Side effects that usually do not require medical attention (report to your doctor or health care professional if they continue or are bothersome): -bone pain -muscle pain This list may not describe all possible side effects. Call your doctor for medical advice about side effects. You may report side effects to FDA at 1-800-FDA-1088. Where should I keep my medicine? Keep out of the reach of children. Store pre-filled syringes in a refrigerator between 2 and 8 degrees C (36 and 46 degrees F). Do not freeze. Keep in carton to protect from light. Throw away this medicine if it is left out of the refrigerator for more than 48 hours. Throw away any unused medicine after the expiration date. NOTE: This sheet is a summary. It may not cover all possible information. If you have questions about this medicine, talk to your doctor, pharmacist, or health care provider.    2016, Elsevier/Gold Standard. (2014-08-11 14:30:14)

## 2015-07-12 ENCOUNTER — Telehealth: Payer: Self-pay | Admitting: *Deleted

## 2015-07-12 NOTE — Telephone Encounter (Signed)
This RN spoke with pt post 1st chemo.  Daneah denies any nausea- vomiting- diarrhea.  States " I had a good night's sleep and am trying not to overdue things today " " I feel good "  Pt verbalized understanding of prescribed medications as well as to call this office if concerns arise.

## 2015-07-12 NOTE — Telephone Encounter (Signed)
-----   Message from Patton Salles, RN sent at 07/11/2015  4:10 PM EST ----- Regarding: GM Chemo call 1st Herceptin/Perjeta/Taxotere/Carbo. Tolerated well

## 2015-07-13 ENCOUNTER — Ambulatory Visit: Payer: Self-pay

## 2015-07-17 ENCOUNTER — Other Ambulatory Visit: Payer: Self-pay

## 2015-07-17 DIAGNOSIS — C50511 Malignant neoplasm of lower-outer quadrant of right female breast: Secondary | ICD-10-CM

## 2015-07-18 ENCOUNTER — Ambulatory Visit (HOSPITAL_BASED_OUTPATIENT_CLINIC_OR_DEPARTMENT_OTHER): Payer: Medicaid Other | Admitting: Nurse Practitioner

## 2015-07-18 ENCOUNTER — Other Ambulatory Visit (HOSPITAL_BASED_OUTPATIENT_CLINIC_OR_DEPARTMENT_OTHER): Payer: Medicaid Other

## 2015-07-18 ENCOUNTER — Encounter: Payer: Self-pay | Admitting: Nurse Practitioner

## 2015-07-18 VITALS — BP 115/69 | HR 96 | Temp 98.5°F | Resp 18 | Ht 67.0 in | Wt 225.9 lb

## 2015-07-18 DIAGNOSIS — C50912 Malignant neoplasm of unspecified site of left female breast: Secondary | ICD-10-CM | POA: Diagnosis present

## 2015-07-18 DIAGNOSIS — T451X5A Adverse effect of antineoplastic and immunosuppressive drugs, initial encounter: Secondary | ICD-10-CM

## 2015-07-18 DIAGNOSIS — C50511 Malignant neoplasm of lower-outer quadrant of right female breast: Secondary | ICD-10-CM

## 2015-07-18 DIAGNOSIS — R21 Rash and other nonspecific skin eruption: Secondary | ICD-10-CM

## 2015-07-18 DIAGNOSIS — R110 Nausea: Secondary | ICD-10-CM

## 2015-07-18 DIAGNOSIS — T380X5A Adverse effect of glucocorticoids and synthetic analogues, initial encounter: Secondary | ICD-10-CM

## 2015-07-18 DIAGNOSIS — J3489 Other specified disorders of nose and nasal sinuses: Secondary | ICD-10-CM

## 2015-07-18 DIAGNOSIS — R197 Diarrhea, unspecified: Secondary | ICD-10-CM

## 2015-07-18 LAB — COMPREHENSIVE METABOLIC PANEL
ALT: 26 U/L (ref 0–55)
ANION GAP: 11 meq/L (ref 3–11)
AST: 15 U/L (ref 5–34)
Albumin: 3.4 g/dL — ABNORMAL LOW (ref 3.5–5.0)
Alkaline Phosphatase: 77 U/L (ref 40–150)
BUN: 8.3 mg/dL (ref 7.0–26.0)
CHLORIDE: 103 meq/L (ref 98–109)
CO2: 22 meq/L (ref 22–29)
Calcium: 9.1 mg/dL (ref 8.4–10.4)
Creatinine: 0.9 mg/dL (ref 0.6–1.1)
EGFR: 78 mL/min/{1.73_m2} — AB (ref >90)
GLUCOSE: 146 mg/dL — AB (ref 70–140)
POTASSIUM: 3.8 meq/L (ref 3.5–5.1)
SODIUM: 136 meq/L (ref 136–145)
TOTAL PROTEIN: 6.7 g/dL (ref 6.4–8.3)
Total Bilirubin: <0.3 mg/dL (ref 0.20–1.20)

## 2015-07-18 LAB — CBC WITH DIFFERENTIAL/PLATELET
BASO%: 0.2 % (ref 0.0–2.0)
Basophils Absolute: 0 10*3/uL (ref 0.0–0.1)
EOS ABS: 0.1 10*3/uL (ref 0.0–0.5)
EOS%: 0.5 % (ref 0.0–7.0)
HCT: 40.3 % (ref 34.8–46.6)
HGB: 13.3 g/dL (ref 11.6–15.9)
LYMPH%: 27.9 % (ref 14.0–49.7)
MCH: 29.4 pg (ref 25.1–34.0)
MCHC: 33 g/dL (ref 31.5–36.0)
MCV: 89 fL (ref 79.5–101.0)
MONO#: 1.4 10*3/uL — AB (ref 0.1–0.9)
MONO%: 11.3 % (ref 0.0–14.0)
NEUT%: 60.1 % (ref 38.4–76.8)
NEUTROS ABS: 7.7 10*3/uL — AB (ref 1.5–6.5)
PLATELETS: 219 10*3/uL (ref 145–400)
RBC: 4.53 10*6/uL (ref 3.70–5.45)
RDW: 12.1 % (ref 11.2–14.5)
WBC: 12.8 10*3/uL — AB (ref 3.9–10.3)
lymph#: 3.6 10*3/uL — ABNORMAL HIGH (ref 0.9–3.3)

## 2015-07-18 MED ORDER — DOXYCYCLINE HYCLATE 100 MG PO TABS: 100.0000 mg | ORAL_TABLET | Freq: Two times a day (BID) | ORAL | Status: DC

## 2015-07-18 NOTE — Progress Notes (Signed)
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 She brought it to medical attention when she presented for the free Pap smear screening clinic here 06/01/2015. Exam on that day showed a lump in the right breast at the 4:00 position under the areola. There was also a scar on the right outer breast. This scar was noted in 01/04/2008 when she had her baseline mammogram at [facility]. It had been stable for a year at that time and the patient had treated it with a "blood root paste" which caused scarring. 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 It is in a separate location from the new mass.  On 06/05/2015 [name] underwent bilateral diagnostic mammography at [facility], with bilateral ultrasonography. This found the breast density to be category B. There was a 2.5 cm oval mass in the right breast at the 5:00 position which by ultrasound measured 2.1 cm. In the left breast there was an area of focal asymmetry which by ultrasound was consistent with fibroglandular tissue/benign.  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  Her subsequent history is as detailed below.  INTERVAL HISTORY: Phyllis Wiggins returns for follow up of her breast cancer, alone. Today is day 8, cycle 1 of carboplatin, docetaxel, trastuzumab, and pertuzumab.  REVIEW OF SYSTEMS: Kaleyah had a few challenges this past week. She ran low grade fevers (tmax 99.7) over the weekend, but denies chills. She had minimal nausea immediately after treatment, but into days 4-5 experienced dry heaves. The zofran did not cause any headaches, but the compazine was likely responsible for the "high" feeling she experienced throughout the week. She had major feelings of jitteriness from the steroids in addition to acne and vision accomodation issues. She did not sleep well despite lorazepam. She had diarrhea for several days and used imodium as advised. She denies mouth sores but believes the sensation to her tongue has changed. She is experiencing taste changes but is remaining well hydrated and eating well. She had some neulasta associated muscle pain despite claritin use, but this was well managed. Her nose was dry and occasionally bloody. A detailed review of systems is otherwise stable.    Marland Kitchen PAST MEDICAL HISTORY: Past Medical History  Diagnosis Date  . Breast cancer of lower-outer quadrant of right female breast ([place]) 06/20/2015  . Breast cancer ([place])   . Arthritis     PAST SURGICAL HISTORY: Past Surgical History  Procedure Laterality Date  . Wisdom tooth extraction    . Portacath placement Left 07/06/2015    Procedure: INSERTION PORT-A-CATH;  Surgeon: Autumn Messing III, MD;  Location: [facility];  Service: General;  Laterality: Left;    FAMILY  HISTORY Family History  Problem Relation Age of Onset  . Diabetes Mother   . Hypertension Mother   . Hyperlipidemia Mother   . Cancer Maternal Grandfather   . Diabetes Paternal Grandmother    the patient's parents are both  living, in their early 56s. The patient had no brother, one sister. The patient's mother works by Education administrator homes for private clients. The patient's sister is a Pharmacist, hospital in [place] there is no history of breast or ovarian cancer in the family. The patient's paternal grandfather died at the age of 83 from stomach cancer felt to be related to iron unusual toxic exposure   GYNECOLOGIC HISTORY:  Patient's last menstrual period was 06/26/2015.  menarche age 68, she is [place] P0  she is still having periods but they're quite sporadic. They may occur 6 weeks apart or 3 weeks apart, may be heavy or only spotting. She used oral contraceptives for more than 20 years with no complications   SOCIAL HISTORY:  Estrellita works multiple jobs as Haematologist, Designer, industrial/product, Radiation protection practitioner, and other part-time jobs. She is divorced and at home lives with 6 dogs.     ADVANCED DIRECTIVES:  not in place    HEALTH MAINTENANCE: Social History  Substance Use Topics  . Smoking status: Former Smoker    Quit date: 07/04/2010  . Smokeless tobacco: Never Used  . Alcohol Use: No     Colonoscopy:  PAP: October 2016   Bone density:  Lipid panel:  Allergies  Allergen Reactions  . Cheese   . Dairy Aid [Lactase]     "sinus infections"  . Whey     Sinus issues  . 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 acetaminophen (TYLENOL) 500 MG tablet Take 500 mg by mouth every 6 (six) hours as needed for mild pain.    Marland Kitchen dexamethasone (DECADRON) 4 MG tablet Take 2 tablets (8 mg total) by mouth 2 (two) times daily. Start the day before Taxotere. Then again the day after chemo for 3 days. 30 tablet 1  . lidocaine-prilocaine (EMLA) cream Apply to affected area once 30 g 3  . LORazepam (ATIVAN) 0.5 MG tablet Take 1 tablet (0.5 mg total) by mouth at bedtime. 30 tablet 0  . ondansetron (ZOFRAN) 8 MG tablet Take 1 tablet (8 mg  total) by mouth 2 (two) times daily. Start the day after chemo for 3 days. Then take as needed for nausea or vomiting. 30 tablet 1  . oxyCODONE-acetaminophen (ROXICET) 5-325 MG tablet Take 1-2 tablets by mouth every 4 (four) hours as needed. 50 tablet 0  . prochlorperazine (COMPAZINE) 10 MG tablet Take 1 tablet (10 mg total) by mouth every 6 (six) hours as needed (Nausea or vomiting). 30 tablet 1  . doxycycline (VIBRA-TABS) 100 MG tablet Take 1 tablet (100 mg total) by mouth 2 (two) times daily. 30 tablet 1  . ibuprofen (ADVIL,MOTRIN) 200 MG tablet Take 200 mg by mouth every 6 (six) hours as needed for mild pain.     No current facility-administered medications for this visit.    OBJECTIVE: Middle-aged white woman in no acute distress  Filed Vitals:   07/18/15 0841  BP: 115/69  Pulse: 96  Temp: 98.5 F (36.9 C)  Resp: 18     Body mass index is 35.37 kg/(m^2).    ECOG FS:0 - Asymptomatic   Sclerae unicteric, pupils round  and equal Oropharynx clear and moist-- no thrush or other lesions No cervical or supraclavicular adenopathy Lungs no rales or rhonchi Heart regular rate and rhythm Abd soft, nontender, positive bowel sounds MSK no focal spinal tenderness, no upper extremity lymphedema Neuro: nonfocal, well oriented, appropriate affect Breasts: deferred   LAB RESULTS:  CMP     Component Value Date/Time   NA 136 07/18/2015 0828   K 3.8 07/18/2015 0828   CO2 22 07/18/2015 0828   GLUCOSE 146* 07/18/2015 0828   BUN 8.3 07/18/2015 0828   CREATININE 0.9 07/18/2015 0828   CALCIUM 9.1 07/18/2015 0828   PROT 6.7 07/18/2015 0828   ALBUMIN 3.4* 07/18/2015 0828   AST 15 07/18/2015 0828   ALT 26 07/18/2015 0828   ALKPHOS 77 07/18/2015 0828   BILITOT <0.30 07/18/2015 0828    INo results found for: SPEP, UPEP  Lab Results  Component Value Date   WBC 12.8* 07/18/2015   NEUTROABS 7.7* 07/18/2015   HGB 13.3 07/18/2015   HCT 40.3 07/18/2015   MCV 89.0 07/18/2015   PLT 219  07/18/2015      Chemistry      Component Value Date/Time   NA 136 07/18/2015 0828   K 3.8 07/18/2015 0828   CO2 22 07/18/2015 0828   BUN 8.3 07/18/2015 0828   CREATININE 0.9 07/18/2015 0828      Component Value Date/Time   CALCIUM 9.1 07/18/2015 0828   ALKPHOS 77 07/18/2015 0828   AST 15 07/18/2015 0828   ALT 26 07/18/2015 0828   BILITOT <0.30 07/18/2015 0828       No results found for: LABCA2  No components found for: LABCA125  No results for input(s): INR in the last 168 hours.  Urinalysis No results found for: COLORURINE, APPEARANCEUR, LABSPEC, PHURINE, GLUCOSEU, HGBUR, BILIRUBINUR, KETONESUR, PROTEINUR, UROBILINOGEN, NITRITE, LEUKOCYTESUR  STUDIES: Mr Breast Bilateral W Wo Contrast  06/21/2015  CLINICAL DATA:  44 year old female who presented with a palpable right breast mass. The mass in the lower-inner right breast has been subsequently biopsied demonstrating malignancy, which may represent "a primary invasive mammary carcinoma or metastatic disease within a lymph node". LABS:  No recent laboratory values. Correlation made with prior mammography, the most recent from 06/05/2015. EXAM: BILATERAL BREAST MRI WITH AND WITHOUT CONTRAST TECHNIQUE: Multiplanar, multisequence MR images of both breasts were obtained prior to and following the intravenous administration of 20 ml of MultiHance. THREE-DIMENSIONAL MR IMAGE RENDERING ON INDEPENDENT WORKSTATION: Three-dimensional MR images were rendered by post-processing of the original MR data on an independent workstation. The three-dimensional MR images were interpreted, and findings are reported in the following complete MRI report for this study. Three dimensional images were evaluated at the independent DynaCad workstation COMPARISON:  No prior MRIs available for comparison. FINDINGS: Breast composition: b. Scattered fibroglandular tissue. Background parenchymal enhancement: Marked. Right breast: There is marked background parenchymal  foci of enhancement similar to the contralateral breast. In the lower-inner quadrant of the right breast, anterior depth there is an oval avidly enhancing mass with washout kinetics measuring 1.9 x 1.6 x 1.7 cm. A focus of susceptibility seen along the inferolateral aspect of the mass, compatible with a biopsy marking clip. In the upper-outer quadrant of the right breast, there is a heterogeneously enhancing circumscribed oval mass measuring 3.3 x 2.4 cm in axial plane. 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 Just medial to the superior edge of  this suspected fibroadenoma, is a suspicious 0.9 x 0.8 x 0.8 cm round enhancing mass at 12 o'clock, anterior depth (series 7, image 67). Left breast: Though there is marked background parenchymal foci of enhancement similar to the contralateral breast, there is no suspicious enhancing mass or abnormal non mass enhancement. Lymph nodes: No abnormal appearing lymph nodes. Ancillary findings:  None. IMPRESSION: 1. The biopsy-proven malignancy in the lower-inner quadrant of the right breast measures up to 1.9 cm. 2. There is a suspicious 0.9 cm enhancing mass at 12 o'clock just adjacent to the larger benign mass in the right breast. 3.  No MRI evidence of malignancy in the left breast. 4. Marked background parenchymal foci of enhancement bilaterally, limiting sensitivity of MRI. RECOMMENDATION: Second-look ultrasound recommended for the 0.9 cm mass in the right breast at 12 o'clock, anterior depth. If not seen sonographically, a MRI guided biopsy will be recommended. BI-RADS CATEGORY  BI-RADS 4: Suspicious. Electronically Signed   By: Ammie Ferrier M.D.   On: 06/21/2015 14:41   Dg Chest Port 1 View  07/06/2015  CLINICAL DATA:  Status post Port-A-Cath placement. EXAM: PORTABLE CHEST 1 VIEW COMPARISON:  None. FINDINGS: The heart size and mediastinal contours are  within normal limits. Both lungs are clear. Left subclavian Port-A-Cath is noted with distal tip in expected position of the SVC. No pneumothorax or pleural effusion is noted. The visualized skeletal structures are unremarkable. IMPRESSION: No pneumothorax seen status post left subclavian Port-A-Cath placement. Electronically Signed   By: Marijo Conception, M.D.   On: 07/06/2015 11:30   Dg Fluoro Guide Cv Line-no Report  07/06/2015  CLINICAL DATA:  FLOURO GUIDE CV LINE Fluoroscopy was utilized by the requesting physician.  No radiographic interpretation.    ASSESSMENT: 44 y.o. [place] woman status post left breast biopsy 06/15/2015 for a clinical T1 cN0, stage IA invasive ductal carcinoma, grade 3, estrogen and progesterone receptor negative, HER-2 amplified, with an MIB-1 of 40%  (1) neoadjuvant chemotherapy to consist of carboplatin, docetaxel, pertuzumab, and trastuzumab with onpro support every 3 weeks 6  (2) breast conserving surgery with sentinel lymph node sampling to follow  (3) adjuvant radiation to follow surgery  (4) trastuzumab to be continued to complete a year  (5) genetics testing pending  PLAN: Despite her challenges, Lumi responded well to the side effects of her first chemotherapy experience. I believe with some minor tweaks she will have a better time with the next cycle.   1. I suggested she cut her steroid dose in half to 67m BID. This should reduce her jitteriness and anxiety.  2. She will continue zofran for nausea, but back off of use of compazine as it is causing some cognitive interference.  3.She will continue on imodium PRN for her diarrhea. 4. She plans to pick up a saline nasal spray for the dryness and bloody drainage from her nose. 5. We will start doxycycline 1023mdaily for her facial rash/acne.   KeGeorgaill return in 2 weeks for the start of cycle 2 of treatment. She understands and agrees with this plan. She knows the goal of treatment in her case is  cure. She has been encouraged to call with any issues that might arise before her next visit here.     HeLaurie PandaNP   07/18/2015 9:30 AM

## 2015-07-19 ENCOUNTER — Ambulatory Visit (HOSPITAL_BASED_OUTPATIENT_CLINIC_OR_DEPARTMENT_OTHER): Payer: Medicaid Other | Admitting: Genetic Counselor

## 2015-07-19 ENCOUNTER — Encounter: Payer: Self-pay | Admitting: Genetic Counselor

## 2015-07-19 ENCOUNTER — Other Ambulatory Visit: Payer: Self-pay

## 2015-07-19 DIAGNOSIS — Z315 Encounter for genetic counseling: Secondary | ICD-10-CM | POA: Diagnosis not present

## 2015-07-19 DIAGNOSIS — Z800 Family history of malignant neoplasm of digestive organs: Secondary | ICD-10-CM

## 2015-07-19 DIAGNOSIS — C50511 Malignant neoplasm of lower-outer quadrant of right female breast: Secondary | ICD-10-CM | POA: Diagnosis not present

## 2015-07-19 NOTE — Progress Notes (Addendum)
REFERRING PROVIDER: Lurline Del, MD  PRIMARY PROVIDER:  Mora Bellman, MD  PRIMARY REASON FOR VISIT:  1. Breast cancer of lower-outer quadrant of right female breast ([place])   2. Family history of stomach cancer      HISTORY OF PRESENT ILLNESS:   [name], a 44 y.o. female, was seen for a [facility] cancer genetics consultation at the request of Dr. Jana Hakim due to a personal and family history of cancer.  Phyllis Wiggins presents to clinic today to discuss the possibility of a hereditary predisposition to cancer, genetic testing, and to further clarify her future cancer risks, as well as potential cancer risks for family members.   In 2016, at the age of 22, Phyllis Wiggins was diagnosed with invasive ductal carcinoma of the right breast. The tumor is ER-/PR-/Her2+. This is being treated with chemotherapy, and she is planning a lumpectomy and radiation.  She will also be treated with Herceptin.      CANCER HISTORY:   No history exists.     HORMONAL RISK FACTORS:  Menarche was at age 60.  First live birth at age N/A.  OCP use for approximately 10 years.  Ovaries intact: yes.  Hysterectomy: no.  Menopausal status: perimenopausal.  HRT use: 0 years. Colonoscopy: no; not examined. Mammogram within the last year: yes. Number of breast biopsies: 1. Up to date with pelvic exams:  yes. Any excessive radiation exposure in the past:  no  Past Medical History  Diagnosis Date  . Breast cancer of lower-outer quadrant of right female breast ([place]) 06/20/2015  . Breast cancer ([place])   . Arthritis   . Family history of stomach cancer     Past Surgical History  Procedure Laterality Date  . Wisdom tooth extraction    . Portacath placement Left 07/06/2015    Procedure: INSERTION PORT-A-CATH;  Surgeon: Autumn Messing III, MD;  Location: [facility];  Service: General;  Laterality: Left;    Social History   Social History  . Marital Status: Divorced    Spouse Name: N/A  . Number of Children: 0   . Years of Education: N/A   Social History Main Topics  . Smoking status: Former Smoker    Types: Cigarettes    Quit date: 07/04/2010  . Smokeless tobacco: Never Used  . Alcohol Use: No  . Drug Use: No  . Sexual Activity: Yes    Birth Control/ Protection: None   Other Topics Concern  . None   Social History Narrative     FAMILY HISTORY:  We obtained a detailed, 4-generation family history.  Significant diagnoses are listed below: Family History  Problem Relation Age of Onset  . Diabetes Mother   . Hypertension Mother   . Hyperlipidemia Mother   . Stomach cancer Maternal Grandfather 59  . Diabetes Paternal Grandmother   . Dementia Maternal Uncle   . Congestive Heart Failure Maternal Grandmother    The patient has one sister who is cancer free.  Both parents are alive, but she does not keep in close contact with her father.  Her mother had an intraductal papaloma several years ago.  Her mother had three brothers, one who died with dementia.  The patient's maternal grandmother died at 35 with CHF, and her grandfather died at 25 with cancer of hte stomach and urethra.  The patient's father has tremors possibly related to his [place] tours.  He had three brothers and one sister.  There is no reported cancer on this side of the family.  Patient's maternal ancestors are of [place] and [place] descent, and paternal ancestors are of [place], [place], [place] and IRish descent. There is no reported Ashkenazi Jewish ancestry. There is no known consanguinity.  GENETIC COUNSELING ASSESSMENT: [name] is a 44 y.o. female with a personal history of early breast cancer which is somewhat suggestive of a hereditary breast cancer sydnrome and predisposition to cancer. We, therefore, discussed and recommended the following at today's visit.   DISCUSSION: We discussed that about 5-10% of breast cancer is hereditary, with the majority associated with BRCA mutations.  Based on the [place] of stomach cancer,  we should consider CDH1.  Other commonly found hereditary genes include PALB2, CHEK2, and ATM mutations. We reviewed the characteristics, features and inheritance patterns of hereditary cancer syndromes. We also discussed genetic testing, including the appropriate family members to test, the process of testing, insurance coverage and turn-around-time for results. We discussed the implications of a negative, positive and/or variant of uncertain significant result. We recommended Phyllis Wiggins pursue genetic testing for the The Breast/Ovarian gene panel offered by [facility] includes sequencing and rearrangement analysis for the following 20 genes:  ATM, BARD1, BRCA1, BRCA2, BRIP1, CDH1, CHEK2, EPCAM, FANCC, MLH1, MSH2, MSH6, NBN, PALB2, PMS2, PTEN, RAD51C, RAD51D, TP53, and XRCC2.    gene panel.   Based on Phyllis Wiggins's personal and family history of cancer, she meets medical criteria for genetic testing. Despite that she meets criteria, she may still have an out of pocket cost. We discussed that if her out of pocket cost for testing is over $100, the laboratory will call and confirm whether she wants to proceed with testing.  If the out of pocket cost of testing is less than $100 she will be billed by the genetic testing laboratory.   PLAN: After considering the risks, benefits, and limitations, Phyllis Wiggins  provided informed consent to pursue genetic testing and the blood sample was sent to [facility] for analysis of the Breast/Ovarian cancer panel. The patient's Medicaid is currently pending.  We discussed that testing cannot commence until her Medicaid is in place.  We discussed self pay, and that there are other labs that have lower self pay rates.  We are pursuing this testing currently, but will send to anther lab if Medicaid is significantly delayed.  Results should be available within approximately 2-3 weeks' time, at which point they will be disclosed by telephone to Phyllis Wiggins, as will any  additional recommendations warranted by these results. Phyllis Wiggins will receive a summary of her genetic counseling visit and a copy of her results once available. This information will also be available in Epic. We encouraged Ms. Shur to remain in contact with cancer genetics annually so that we can continuously update the family history and inform her of any changes in cancer genetics and testing that may be of benefit for her family. Ms. Dovidio questions were answered to her satisfaction today. Our contact information was provided should additional questions or concerns arise.  Lastly, we encouraged Ms. Weekly to remain in contact with cancer genetics annually so that we can continuously update the family history and inform her of any changes in cancer genetics and testing that may be of benefit for this family.   Ms.  Tayag questions were answered to her satisfaction today. Our contact information was provided should additional questions or concerns arise. Thank you for the referral and allowing [place] to share in the care of your patient.   Karen P. Florene Glen, MS, [place]  Certified [email or web address] phone: [phone]  The patient was seen for a total of 35 minutes in face-to-face genetic counseling.  This patient was discussed with Drs. Magrinat, Lindi Adie and/or Burr Medico who agrees with the above.    _______________________________________________________________________ For Office Staff:  Number of people involved in session: 2 Was an Intern/ student involved with case: no

## 2015-07-21 ENCOUNTER — Telehealth: Payer: Self-pay | Admitting: *Deleted

## 2015-07-21 NOTE — Telephone Encounter (Signed)
Called pt to f/u to see if the doxycycline has helped the outbreak of facial pustules that she had after 1st tx. Pt was prescribed Doxycycline 100 mg to take 2 times daily by Towana Badger when she came to appt this week. Since then the acne has cleared to facial areas but she has new outbreaks to her shoulders, chest and inner thighs. Pt is still taking the medication and the acne to her inner thighs is clearing. She said the pustules are not as large and painful as when she was seen in the office. Pt will continue on the doxycycline and next appt will be Dec.23rd. Message to be fwd to Gentry Fitz, NP.

## 2015-07-26 ENCOUNTER — Encounter (HOSPITAL_COMMUNITY): Payer: Self-pay | Admitting: *Deleted

## 2015-07-27 ENCOUNTER — Other Ambulatory Visit: Payer: Self-pay | Admitting: Oncology

## 2015-07-27 ENCOUNTER — Other Ambulatory Visit: Payer: Self-pay | Admitting: *Deleted

## 2015-07-27 DIAGNOSIS — C50511 Malignant neoplasm of lower-outer quadrant of right female breast: Secondary | ICD-10-CM

## 2015-07-28 ENCOUNTER — Other Ambulatory Visit (HOSPITAL_BASED_OUTPATIENT_CLINIC_OR_DEPARTMENT_OTHER): Payer: Medicaid Other

## 2015-07-28 ENCOUNTER — Ambulatory Visit (HOSPITAL_BASED_OUTPATIENT_CLINIC_OR_DEPARTMENT_OTHER): Payer: Medicaid Other | Admitting: Nurse Practitioner

## 2015-07-28 ENCOUNTER — Encounter: Payer: Self-pay | Admitting: Nurse Practitioner

## 2015-07-28 VITALS — BP 119/73 | HR 83 | Temp 98.4°F | Resp 18 | Ht 67.0 in | Wt 227.3 lb

## 2015-07-28 DIAGNOSIS — C50511 Malignant neoplasm of lower-outer quadrant of right female breast: Secondary | ICD-10-CM | POA: Diagnosis present

## 2015-07-28 DIAGNOSIS — R21 Rash and other nonspecific skin eruption: Secondary | ICD-10-CM | POA: Diagnosis not present

## 2015-07-28 LAB — COMPREHENSIVE METABOLIC PANEL
ALBUMIN: 3.4 g/dL — AB (ref 3.5–5.0)
ALK PHOS: 58 U/L (ref 40–150)
ALT: 20 U/L (ref 0–55)
AST: 14 U/L (ref 5–34)
Anion Gap: 10 mEq/L (ref 3–11)
BUN: 10.7 mg/dL (ref 7.0–26.0)
CALCIUM: 9.2 mg/dL (ref 8.4–10.4)
CO2: 24 mEq/L (ref 22–29)
Chloride: 106 mEq/L (ref 98–109)
Creatinine: 0.8 mg/dL (ref 0.6–1.1)
EGFR: 84 mL/min/{1.73_m2} — ABNORMAL LOW (ref >90)
Glucose: 124 mg/dl (ref 70–140)
POTASSIUM: 4.2 meq/L (ref 3.5–5.1)
Sodium: 139 mEq/L (ref 136–145)
Total Bilirubin: 0.37 mg/dL (ref 0.20–1.20)
Total Protein: 6.8 g/dL (ref 6.4–8.3)

## 2015-07-28 LAB — CBC WITH DIFFERENTIAL/PLATELET
BASO%: 0.8 % (ref 0.0–2.0)
BASOS ABS: 0 10*3/uL (ref 0.0–0.1)
EOS%: 0.5 % (ref 0.0–7.0)
Eosinophils Absolute: 0 10*3/uL (ref 0.0–0.5)
HEMATOCRIT: 37.4 % (ref 34.8–46.6)
HEMOGLOBIN: 12.4 g/dL (ref 11.6–15.9)
LYMPH#: 2.1 10*3/uL (ref 0.9–3.3)
LYMPH%: 33.3 % (ref 14.0–49.7)
MCH: 29.4 pg (ref 25.1–34.0)
MCHC: 33.3 g/dL (ref 31.5–36.0)
MCV: 88.3 fL (ref 79.5–101.0)
MONO#: 0.3 10*3/uL (ref 0.1–0.9)
MONO%: 5.1 % (ref 0.0–14.0)
NEUT#: 3.8 10*3/uL (ref 1.5–6.5)
NEUT%: 60.3 % (ref 38.4–76.8)
PLATELETS: 142 10*3/uL — AB (ref 145–400)
RBC: 4.23 10*6/uL (ref 3.70–5.45)
RDW: 12.2 % (ref 11.2–14.5)
WBC: 6.3 10*3/uL (ref 3.9–10.3)

## 2015-07-28 NOTE — Progress Notes (Signed)
[facility]  Telephone:([phone]) [phone] Fax:([phone]) [phone]     ID: Phyllis Wiggins DOB: 1971/01/04  MR#: [id number]  STM#:[id number]  Patient Care Team: Mora Bellman, MD as PCP - General (Obstetrics and Gynecology) Autumn Messing III, MD as Consulting Physician (General Surgery) Chauncey Cruel, MD as Consulting Physician (Oncology) [name], MD as Consulting Physician (Radiation Oncology) Sylvan Cheese, NP as Nurse Practitioner (Hematology and Oncology) PCP: Mora Bellman, MD GYN: OTHER MD:  CHIEF COMPLAINT: HER-2 positive, estrogen and progesterone receptor negative breast cancer  CURRENT TREATMENT: Neoadjuvant chemotherapy/immunotherapy   BREAST CANCER HISTORY: Anahita noted a change in her right breast sometime in September 2016. She brought it to medical attention when she presented for the free Pap smear screening clinic here 06/01/2015. Exam on that day showed a lump in the right breast at the 4:00 position under the areola. There was also a scar on the right outer breast. This scar was noted in 01/04/2008 when she had her baseline mammogram at [facility]. It had been stable for a year at that time and the patient had treated it with a "blood root paste" which caused scarring. 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 It is in a separate location from the new mass.  On 06/05/2015 [name] underwent bilateral diagnostic mammography at [facility], with bilateral ultrasonography. This found the breast density to be category B. There was a 2.5 cm oval mass in the right breast at the 5:00 position which by ultrasound measured 2.1 cm. In the left breast there was an area of focal asymmetry which by ultrasound was consistent with fibroglandular tissue/benign.  On 06/15/2015 [name] underwent biopsy of the right breast mass in question, and this showed ([phone]) an invasive ductal carcinoma (E-cadherin positive) grade 3, estrogen receptor and progesterone receptor negative, with an  MIB-1 of 40% and HER-2 amplification, the signals ratio being 5.27 and the number per cell 16.25.  Her subsequent history is as detailed below.  INTERVAL HISTORY: Kavitha returns for follow up of her breast cancer, accompanied by her sister.  Today is day 18, cycle 1 of carboplatin, docetaxel, trastuzumab, and pertuzumab.  REVIEW OF SYSTEMS: Kamarah's rash has improved on doxycycline. It is no longer painful. She is taking showers that are less hot and this was helpful too. There are still some stubborn lesions to her face and legs. She has had no true fevers or chills. Her nausea has resolved. She is moving her bowels well, though they are loose. She has imodium on hand. Her taste has changes but she is keeping up with meals and water. She has no mouth sores. She is sleeping well. Her energy level is decent. A detailed review of systems is otherwise stable.  PAST MEDICAL HISTORY: Past Medical History  Diagnosis Date  . Breast cancer of lower-outer quadrant of right female breast ([place]) 06/20/2015  . Breast cancer ([place])   . Arthritis   . Family history of stomach cancer     PAST SURGICAL HISTORY: Past Surgical History  Procedure Laterality Date  . Wisdom tooth extraction    . Portacath placement Left 07/06/2015    Procedure: INSERTION PORT-A-CATH;  Surgeon: Autumn Messing III, MD;  Location: [facility] OR;  Service: General;  Laterality: Left;    FAMILY HISTORY Family History  Problem Relation Age of Onset  . Diabetes Mother   . Hypertension Mother   . Hyperlipidemia Mother   . Stomach cancer Maternal Grandfather 33  . Diabetes Paternal Grandmother   . Dementia Maternal Uncle   .  Congestive Heart Failure Maternal Grandmother    the patient's parents are both living, in their early 25s. The patient had no brother, one sister. The patient's mother works by Education administrator homes for private clients. The patient's sister is a Pharmacist, hospital in [place] there is no history of breast or ovarian cancer in the family.  The patient's paternal grandfather died at the age of 33 from stomach cancer felt to be related to iron unusual toxic exposure   GYNECOLOGIC HISTORY:  Patient's last menstrual period was 06/26/2015.  menarche age 3, she is [place] P0  she is still having periods but they're quite sporadic. They may occur 6 weeks apart or 3 weeks apart, may be heavy or only spotting. She used oral contraceptives for more than 20 years with no complications   SOCIAL HISTORY:  Caleb works multiple jobs as Haematologist, Designer, industrial/product, Radiation protection practitioner, and other part-time jobs. She is divorced and at home lives with 6 dogs.     ADVANCED DIRECTIVES:  not in place    HEALTH MAINTENANCE: Social History  Substance Use Topics  . Smoking status: Former Smoker    Types: Cigarettes    Quit date: 07/04/2010  . Smokeless tobacco: Never Used  . Alcohol Use: No     Colonoscopy:  PAP: October 2016   Bone density:  Lipid panel:  Allergies  Allergen Reactions  . Cheese   . Dairy Aid [Lactase]     "sinus infections"  . Whey     Sinus issues  . 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 dexamethasone (DECADRON) 4 MG tablet Take 2 tablets (8 mg total) by mouth 2 (two) times daily. Start the day before Taxotere. Then again the day after chemo for 3 days. 30 tablet 1  . lidocaine-prilocaine (EMLA) cream Apply to affected area once 30 g 3  . ondansetron (ZOFRAN) 8 MG tablet Take 1 tablet (8 mg total) by mouth 2 (two) times daily. Start the day after chemo for 3 days. Then take as needed for nausea or vomiting. 30 tablet 1  . acetaminophen (TYLENOL) 500 MG tablet Take 500 mg by mouth every 6 (six) hours as needed for mild pain. Reported on 07/28/2015    . doxycycline (VIBRA-TABS) 100 MG tablet Take 1 tablet (100 mg total) by mouth 2 (two) times daily. (Patient not taking: Reported on 07/28/2015) 30 tablet 1  .  ibuprofen (ADVIL,MOTRIN) 200 MG tablet Take 200 mg by mouth every 6 (six) hours as needed for mild pain. Reported on 07/28/2015    . LORazepam (ATIVAN) 0.5 MG tablet Take 1 tablet (0.5 mg total) by mouth at bedtime. (Patient not taking: Reported on 07/28/2015) 30 tablet 0  . oxyCODONE-acetaminophen (ROXICET) 5-325 MG tablet Take 1-2 tablets by mouth every 4 (four) hours as needed. (Patient not taking: Reported on 07/28/2015) 50 tablet 0  . prochlorperazine (COMPAZINE) 10 MG tablet Take 1 tablet (10 mg total) by mouth every 6 (six) hours as needed (Nausea or vomiting). (Patient not taking: Reported on 07/28/2015) 30 tablet 1   No current facility-administered medications for this visit.    OBJECTIVE: Middle-aged white woman in no acute distress  Filed Vitals:   07/28/15 0827  BP: 119/73  Pulse: 83  Temp: 98.4 F (36.9 C)  Resp: 18     Body mass index is 35.59 kg/(m^2).    ECOG FS:0 - Asymptomatic  Skin: warm, dry, scattered erythematous pustules to face. Flat erythematous lesions to legs HEENT: sclerae anicteric, conjunctivae pink, oropharynx clear. No thrush or mucositis.  Lymph Nodes: No cervical or supraclavicular lymphadenopathy  Lungs: clear to auscultation bilaterally, no rales, wheezes, or rhonci  Heart: regular rate and rhythm  Abdomen: round, soft, non tender, positive bowel sounds  Musculoskeletal: No focal spinal tenderness, no peripheral edema  Neuro: non focal, well oriented, positive affect  Breasts: deferred   LAB RESULTS:  CMP     Component Value Date/Time   NA 136 07/18/2015 0828   K 3.8 07/18/2015 0828   CO2 22 07/18/2015 0828   GLUCOSE 146* 07/18/2015 0828   BUN 8.3 07/18/2015 0828   CREATININE 0.9 07/18/2015 0828   CALCIUM 9.1 07/18/2015 0828   PROT 6.7 07/18/2015 0828   ALBUMIN 3.4* 07/18/2015 0828   AST 15 07/18/2015 0828   ALT 26 07/18/2015 0828   ALKPHOS 77 07/18/2015 0828   BILITOT <0.30 07/18/2015 0828    INo results found for: SPEP,  UPEP  Lab Results  Component Value Date   WBC 6.3 07/28/2015   NEUTROABS 3.8 07/28/2015   HGB 12.4 07/28/2015   HCT 37.4 07/28/2015   MCV 88.3 07/28/2015   PLT 142* 07/28/2015      Chemistry      Component Value Date/Time   NA 136 07/18/2015 0828   K 3.8 07/18/2015 0828   CO2 22 07/18/2015 0828   BUN 8.3 07/18/2015 0828   CREATININE 0.9 07/18/2015 0828      Component Value Date/Time   CALCIUM 9.1 07/18/2015 0828   ALKPHOS 77 07/18/2015 0828   AST 15 07/18/2015 0828   ALT 26 07/18/2015 0828   BILITOT <0.30 07/18/2015 0828       No results found for: LABCA2  No components found for: LABCA125  No results for input(s): INR in the last 168 hours.  Urinalysis No results found for: COLORURINE, APPEARANCEUR, LABSPEC, PHURINE, GLUCOSEU, HGBUR, BILIRUBINUR, KETONESUR, PROTEINUR, UROBILINOGEN, NITRITE, LEUKOCYTESUR  STUDIES: Dg Chest Port 1 View  07/06/2015  CLINICAL DATA:  Status post Port-A-Cath placement. EXAM: PORTABLE CHEST 1 VIEW COMPARISON:  None. FINDINGS: The heart size and mediastinal contours are within normal limits. Both lungs are clear. Left subclavian Port-A-Cath is noted with distal tip in expected position of the SVC. No pneumothorax or pleural effusion is noted. The visualized skeletal structures are unremarkable. IMPRESSION: No pneumothorax seen status post left subclavian Port-A-Cath placement. Electronically Signed   By: Marijo Conception, M.D.   On: 07/06/2015 11:30   Dg Fluoro Guide Cv Line-no Report  07/06/2015  CLINICAL DATA:  FLOURO GUIDE CV LINE Fluoroscopy was utilized by the requesting physician.  No radiographic interpretation.    ASSESSMENT: 44 y.o. [place] woman status post left breast biopsy 06/15/2015 for a clinical T1 cN0, stage IA invasive ductal carcinoma, grade 3, estrogen and progesterone receptor negative, HER-2 amplified, with an MIB-1 of 40%  (1) neoadjuvant chemotherapy to consist of carboplatin, docetaxel, pertuzumab, and  trastuzumab with onpro support every 3 weeks 6  (2) breast conserving surgery with sentinel lymph node sampling to follow  (3) adjuvant radiation to follow surgery  (4) trastuzumab to be continued to complete a year  (5) genetics testing pending  PLAN: Shila has recovered well. The labs were reviewed in detail and were stable. She will proceed with her next dose of carboplatin, docetaxel, pertuzumab, and trastuzumab as planned next week.   We discussed her rash. It is improving while on  doxycycline. If it erupts again or somehow worsens with time, we may have to trial leaving out the pertuzumab as it is potentially the source of the rash. Right now she would like to try again.  Shalanda will return on Tuesday for cycle 2 of treatment. She understands and agrees with this plan. She knows the goal of treatment in her case is cure. She has been encouraged to call with any issues that might arise before her next visit here.     Laurie Panda, NP   07/28/2015 10:35 AM

## 2015-08-01 ENCOUNTER — Other Ambulatory Visit: Payer: Self-pay | Admitting: *Deleted

## 2015-08-01 ENCOUNTER — Other Ambulatory Visit: Payer: Self-pay | Admitting: Nurse Practitioner

## 2015-08-01 ENCOUNTER — Ambulatory Visit (HOSPITAL_BASED_OUTPATIENT_CLINIC_OR_DEPARTMENT_OTHER): Payer: Medicaid Other

## 2015-08-01 ENCOUNTER — Other Ambulatory Visit: Payer: Self-pay

## 2015-08-01 VITALS — BP 117/71 | HR 86 | Temp 98.5°F | Resp 18

## 2015-08-01 DIAGNOSIS — C50511 Malignant neoplasm of lower-outer quadrant of right female breast: Secondary | ICD-10-CM

## 2015-08-01 DIAGNOSIS — Z5189 Encounter for other specified aftercare: Secondary | ICD-10-CM

## 2015-08-01 DIAGNOSIS — Z5112 Encounter for antineoplastic immunotherapy: Secondary | ICD-10-CM

## 2015-08-01 MED ORDER — HEPARIN SOD (PORK) LOCK FLUSH 100 UNIT/ML IV SOLN
500.0000 [IU] | Freq: Once | INTRAVENOUS | Status: AC | PRN
  Administered 2015-08-01: 500 [IU]
  Filled 2015-08-01: qty 5

## 2015-08-01 MED ORDER — DIPHENHYDRAMINE HCL 25 MG PO CAPS
ORAL_CAPSULE | ORAL | Status: AC
  Filled 2015-08-01: qty 2

## 2015-08-01 MED ORDER — PERTUZUMAB CHEMO INJECTION 420 MG/14ML
420.0000 mg | Freq: Once | INTRAVENOUS | Status: AC
  Administered 2015-08-01: 420 mg via INTRAVENOUS
  Filled 2015-08-01: qty 14

## 2015-08-01 MED ORDER — TRASTUZUMAB CHEMO INJECTION 440 MG
6.0000 mg/kg | Freq: Once | INTRAVENOUS | Status: AC
  Administered 2015-08-01: 609 mg via INTRAVENOUS
  Filled 2015-08-01: qty 29

## 2015-08-01 MED ORDER — SODIUM CHLORIDE 0.9 % IV SOLN
750.0000 mg | Freq: Once | INTRAVENOUS | Status: AC
  Administered 2015-08-01: 750 mg via INTRAVENOUS
  Filled 2015-08-01: qty 75

## 2015-08-01 MED ORDER — ACETAMINOPHEN 325 MG PO TABS
ORAL_TABLET | ORAL | Status: AC
  Filled 2015-08-01: qty 2

## 2015-08-01 MED ORDER — DIPHENHYDRAMINE HCL 25 MG PO CAPS
25.0000 mg | ORAL_CAPSULE | Freq: Once | ORAL | Status: AC
  Administered 2015-08-01: 25 mg via ORAL

## 2015-08-01 MED ORDER — PEGFILGRASTIM 6 MG/0.6ML ~~LOC~~ PSKT
6.0000 mg | PREFILLED_SYRINGE | Freq: Once | SUBCUTANEOUS | Status: AC
  Administered 2015-08-01: 6 mg via SUBCUTANEOUS
  Filled 2015-08-01: qty 0.6

## 2015-08-01 MED ORDER — DOCETAXEL CHEMO INJECTION 160 MG/16ML
75.0000 mg/m2 | Freq: Once | INTRAVENOUS | Status: AC
  Administered 2015-08-01: 160 mg via INTRAVENOUS
  Filled 2015-08-01: qty 16

## 2015-08-01 MED ORDER — SODIUM CHLORIDE 0.9 % IJ SOLN
10.0000 mL | INTRAMUSCULAR | Status: DC | PRN
  Administered 2015-08-01: 10 mL
  Filled 2015-08-01: qty 10

## 2015-08-01 MED ORDER — ACETAMINOPHEN 325 MG PO TABS
650.0000 mg | ORAL_TABLET | Freq: Once | ORAL | Status: AC
  Administered 2015-08-01: 650 mg via ORAL

## 2015-08-01 MED ORDER — SODIUM CHLORIDE 0.9 % IV SOLN
Freq: Once | INTRAVENOUS | Status: AC
  Administered 2015-08-01: 10:00:00 via INTRAVENOUS

## 2015-08-01 MED ORDER — DEXAMETHASONE SODIUM PHOSPHATE 100 MG/10ML IJ SOLN
Freq: Once | INTRAMUSCULAR | Status: AC
  Administered 2015-08-01: 12:00:00 via INTRAVENOUS
  Filled 2015-08-01: qty 8

## 2015-08-01 NOTE — Patient Instructions (Signed)
  [facility] Discharge Instructions for Patients Receiving Chemotherapy  Today you received the following chemotherapy agents: Taxotere, Craboplatin, Herceptin, and Perjeta   To help prevent nausea and vomiting after your treatment, we encourage you to take your nausea medication as directed.    If you develop nausea and vomiting that is not controlled by your nausea medication, call the clinic.   BELOW ARE SYMPTOMS THAT SHOULD BE REPORTED IMMEDIATELY:  *FEVER GREATER THAN 100.5 F  *CHILLS WITH OR WITHOUT FEVER  NAUSEA AND VOMITING THAT IS NOT CONTROLLED WITH YOUR NAUSEA MEDICATION  *UNUSUAL SHORTNESS OF BREATH  *UNUSUAL BRUISING OR BLEEDING  TENDERNESS IN MOUTH AND THROAT WITH OR WITHOUT PRESENCE OF ULCERS  *URINARY PROBLEMS  *BOWEL PROBLEMS  UNUSUAL RASH Items with * indicate a potential emergency and should be followed up as soon as possible.  Feel free to call the clinic you have any questions or concerns. The clinic phone number is [phone].  Please show the [facility] at check-in to the Emergency Department and triage nurse.

## 2015-08-04 ENCOUNTER — Other Ambulatory Visit: Payer: Self-pay | Admitting: *Deleted

## 2015-08-04 DIAGNOSIS — C50919 Malignant neoplasm of unspecified site of unspecified female breast: Secondary | ICD-10-CM

## 2015-08-08 ENCOUNTER — Encounter: Payer: Self-pay | Admitting: Nurse Practitioner

## 2015-08-08 ENCOUNTER — Ambulatory Visit (HOSPITAL_BASED_OUTPATIENT_CLINIC_OR_DEPARTMENT_OTHER): Payer: Medicaid Other | Admitting: Nurse Practitioner

## 2015-08-08 ENCOUNTER — Other Ambulatory Visit (HOSPITAL_BASED_OUTPATIENT_CLINIC_OR_DEPARTMENT_OTHER): Payer: Medicaid Other

## 2015-08-08 ENCOUNTER — Telehealth: Payer: Self-pay | Admitting: Oncology

## 2015-08-08 VITALS — BP 112/78 | HR 113 | Temp 98.1°F | Resp 18 | Ht 67.0 in | Wt 220.9 lb

## 2015-08-08 DIAGNOSIS — C50912 Malignant neoplasm of unspecified site of left female breast: Secondary | ICD-10-CM | POA: Diagnosis present

## 2015-08-08 DIAGNOSIS — R51 Headache: Secondary | ICD-10-CM

## 2015-08-08 DIAGNOSIS — T50905A Adverse effect of unspecified drugs, medicaments and biological substances, initial encounter: Secondary | ICD-10-CM

## 2015-08-08 DIAGNOSIS — C50919 Malignant neoplasm of unspecified site of unspecified female breast: Secondary | ICD-10-CM

## 2015-08-08 DIAGNOSIS — R21 Rash and other nonspecific skin eruption: Secondary | ICD-10-CM | POA: Diagnosis not present

## 2015-08-08 DIAGNOSIS — G4440 Drug-induced headache, not elsewhere classified, not intractable: Secondary | ICD-10-CM

## 2015-08-08 DIAGNOSIS — R634 Abnormal weight loss: Secondary | ICD-10-CM

## 2015-08-08 DIAGNOSIS — C50511 Malignant neoplasm of lower-outer quadrant of right female breast: Secondary | ICD-10-CM

## 2015-08-08 LAB — CBC WITH DIFFERENTIAL/PLATELET
BASO%: 0.9 % (ref 0.0–2.0)
Basophils Absolute: 0.1 10*3/uL (ref 0.0–0.1)
EOS ABS: 0.2 10*3/uL (ref 0.0–0.5)
EOS%: 1.8 % (ref 0.0–7.0)
HEMATOCRIT: 37.3 % (ref 34.8–46.6)
HEMOGLOBIN: 12.4 g/dL (ref 11.6–15.9)
LYMPH#: 3.4 10*3/uL — AB (ref 0.9–3.3)
LYMPH%: 32.3 % (ref 14.0–49.7)
MCH: 29.6 pg (ref 25.1–34.0)
MCHC: 33.3 g/dL (ref 31.5–36.0)
MCV: 88.8 fL (ref 79.5–101.0)
MONO#: 0.8 10*3/uL (ref 0.1–0.9)
MONO%: 8.1 % (ref 0.0–14.0)
NEUT%: 56.9 % (ref 38.4–76.8)
NEUTROS ABS: 5.9 10*3/uL (ref 1.5–6.5)
PLATELETS: 209 10*3/uL (ref 145–400)
RBC: 4.2 10*6/uL (ref 3.70–5.45)
RDW: 13 % (ref 11.2–14.5)
WBC: 10.4 10*3/uL — AB (ref 3.9–10.3)

## 2015-08-08 LAB — COMPREHENSIVE METABOLIC PANEL
ALBUMIN: 3.5 g/dL (ref 3.5–5.0)
ALK PHOS: 74 U/L (ref 40–150)
ALT: 20 U/L (ref 0–55)
AST: 14 U/L (ref 5–34)
Anion Gap: 9 mEq/L (ref 3–11)
BUN: 8.2 mg/dL (ref 7.0–26.0)
CALCIUM: 9.1 mg/dL (ref 8.4–10.4)
CO2: 27 mEq/L (ref 22–29)
CREATININE: 0.9 mg/dL (ref 0.6–1.1)
Chloride: 101 mEq/L (ref 98–109)
EGFR: 79 mL/min/{1.73_m2} — ABNORMAL LOW (ref >90)
GLUCOSE: 145 mg/dL — AB (ref 70–140)
Potassium: 3.8 mEq/L (ref 3.5–5.1)
SODIUM: 136 meq/L (ref 136–145)
Total Bilirubin: 0.34 mg/dL (ref 0.20–1.20)
Total Protein: 6.6 g/dL (ref 6.4–8.3)

## 2015-08-08 NOTE — Progress Notes (Signed)
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 She brought it to medical attention when she presented for the free Pap smear screening clinic here 06/01/2015. Exam on that day showed a lump in the right breast at the 4:00 position under the areola. There was also a scar on the right outer breast. This scar was noted in 01/04/2008 when she had her baseline mammogram at [facility]. It had been stable for a year at that time and the patient had treated it with a "blood root paste" which caused scarring. 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 It is in a separate location from the new mass.  On 06/05/2015 Phyllis Wiggins underwent bilateral diagnostic mammography at [facility], with bilateral ultrasonography. This found the breast density to be category B. There was a 2.5 cm oval mass in the right breast at the 5:00 position which by ultrasound measured 2.1 cm. In the left breast there was an area of focal asymmetry which by ultrasound was consistent with fibroglandular tissue/benign.  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  Her subsequent history is as detailed below.  INTERVAL HISTORY: Phyllis Wiggins returns for follow up of her breast cancer, alone.  Today is day 8, cycle 2 of carboplatin, docetaxel, trastuzumab, and pertuzumab.  REVIEW OF SYSTEMS: Phyllis Wiggins is battling some upper respiratory symptoms. She is having clear drainage, but also post nasal drainage which is causing a sore throat. She has had no true fevers, just mild temperature elevations to 99.61F. She has been on tylenol anyway for recurrent headaches. Her nausea hit later than the first cycle, but she experienced no vomiting. She is moving her bowels well. Her appetite is down secondary to taste changes and she is losing weight as a result. The rash to her face and legs is improving despite being off of doxycycline these past 2 weeks. She has no mouth sores or neuropathy symptoms. She experiences no bone pain from neulasta. She more fatigued, but is sleeping well. A detailed review of systems is otherwise stable.  PAST MEDICAL HISTORY: Past Medical History  Diagnosis Date  . Breast cancer of lower-outer quadrant of right female breast ([place]) 06/20/2015  . Breast cancer ([place])   . Arthritis   . Family history of stomach cancer     PAST SURGICAL HISTORY: Past Surgical History  Procedure Laterality Date  . Wisdom tooth extraction    . Portacath placement Left 07/06/2015    Procedure: INSERTION PORT-A-CATH;  Surgeon: Autumn Messing III, MD;  Location: [facility] OR;  Service: General;  Laterality: Left;    FAMILY HISTORY Family History  Problem Relation Age of Onset  . Diabetes Mother   .  Hypertension Mother   . Hyperlipidemia Mother   . Stomach cancer Maternal Grandfather 69  . Diabetes Paternal Grandmother   . Dementia Maternal Uncle   . Congestive Heart Failure Maternal Grandmother    the patient's parents are both living, in their early 24s. The patient had no  brother, one sister. The patient's mother works by [employer] for private clients. The patient's sister is a Pharmacist, hospital in [place] there is no history of breast or ovarian cancer in the family. The patient's paternal grandfather died at the age of 50 from stomach cancer felt to be related to iron unusual toxic exposure   GYNECOLOGIC HISTORY:  No LMP recorded.  menarche age 43, she is [place] P0  she is still having periods but they're quite sporadic. They may occur 6 weeks apart or 3 weeks apart, may be heavy or only spotting. 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 She is divorced and at home lives with 6 dogs.     ADVANCED DIRECTIVES:  not in place    HEALTH MAINTENANCE: Social History  Substance Use Topics  . Smoking status: Former Smoker    Types: Cigarettes    Quit date: 07/04/2010  . Smokeless tobacco: Never Used  . Alcohol Use: No     Colonoscopy:  PAP: October 2016   Bone density:  Lipid panel:  Allergies  Allergen Reactions  . Cheese   . Dairy Aid [Lactase]     "sinus infections"  . Whey     Sinus issues  . 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 acetaminophen (TYLENOL) 500 MG tablet Take 500 mg by mouth every 6 (six) hours as needed for mild pain. Reported on 07/28/2015    . dexamethasone (DECADRON) 4 MG tablet Take 2 tablets (8 mg total) by mouth 2 (two) times daily. Start the day before Taxotere. Then again the day after chemo for 3 days. 30 tablet 1  . doxycycline (VIBRA-TABS) 100 MG tablet Take 1 tablet (100 mg total) by mouth 2 (two) times daily. (Patient not taking: Reported on 07/28/2015) 30 tablet 1  . ibuprofen (ADVIL,MOTRIN) 200 MG tablet Take 200 mg by mouth every 6 (six) hours as needed for mild  pain. Reported on 07/28/2015    . lidocaine-prilocaine (EMLA) cream Apply to affected area once 30 g 3  . LORazepam (ATIVAN) 0.5 MG tablet Take 1 tablet (0.5 mg total) by mouth at bedtime. (Patient not taking: Reported on 07/28/2015) 30 tablet 0  . ondansetron (ZOFRAN) 8 MG tablet Take 1 tablet (8 mg total) by mouth 2 (two) times daily. Start the day after chemo for 3 days. Then take as needed for nausea or vomiting. 30 tablet 1  . oxyCODONE-acetaminophen (ROXICET) 5-325 MG tablet Take 1-2 tablets by mouth every 4 (four) hours as needed. (Patient not taking: Reported on 07/28/2015) 50 tablet 0  . prochlorperazine (COMPAZINE) 10 MG tablet Take 1 tablet (10 mg total) by mouth every 6 (six) hours as needed (Nausea or vomiting). (Patient not taking: Reported on 07/28/2015) 30 tablet 1   No current facility-administered medications for this visit.    OBJECTIVE: Middle-aged white woman in no acute distress  Filed Vitals:   08/08/15 0934  BP: 112/78  Pulse: 113  Temp: 98.1 F (36.7 C)  Resp: 18     Body mass index is 34.59 kg/(m^2).    ECOG FS:0 - Asymptomatic   Skin: improving appearance of erythematous pustules to face Sclerae unicteric, pupils round and equal Oropharynx clear and moist-- no thrush or other lesions No cervical or supraclavicular adenopathy Lungs no rales or rhonchi Heart regular rate and rhythm Abd soft, nontender, positive bowel sounds MSK no focal spinal tenderness, no upper extremity lymphedema Neuro: nonfocal, well oriented, appropriate affect Breasts: deferred   LAB RESULTS:  CMP     Component Value Date/Time   NA 139 07/28/2015 0754   K 4.2 07/28/2015 0754   CO2 24 07/28/2015 0754   GLUCOSE 124 07/28/2015 0754   BUN 10.7 07/28/2015 0754   CREATININE 0.8 07/28/2015 0754   CALCIUM 9.2 07/28/2015 0754   PROT 6.8 07/28/2015 0754   ALBUMIN 3.4* 07/28/2015 0754   AST 14 07/28/2015 0754   ALT 20 07/28/2015 0754   ALKPHOS 58 07/28/2015 0754   BILITOT 0.37  07/28/2015 0754    INo results found for: SPEP, UPEP  Lab Results  Component Value Date   WBC 10.4* 08/08/2015   NEUTROABS 5.9 08/08/2015   HGB 12.4 08/08/2015   HCT 37.3 08/08/2015   MCV 88.8 08/08/2015   PLT 209 08/08/2015      Chemistry      Component Value Date/Time   NA 139 07/28/2015 0754   K 4.2 07/28/2015 0754   CO2 24 07/28/2015 0754   BUN 10.7 07/28/2015 0754   CREATININE 0.8 07/28/2015 0754      Component Value Date/Time   CALCIUM 9.2 07/28/2015 0754   ALKPHOS 58 07/28/2015 0754   AST 14 07/28/2015 0754   ALT 20 07/28/2015 0754   BILITOT 0.37 07/28/2015 0754       No results found for: LABCA2  No components found for: [id number]  No results for input(s): INR in the last 168 hours.  Urinalysis No results found for: COLORURINE, APPEARANCEUR, LABSPEC, PHURINE, GLUCOSEU, HGBUR, BILIRUBINUR, KETONESUR, PROTEINUR, UROBILINOGEN, NITRITE, LEUKOCYTESUR  STUDIES: No results found.  ASSESSMENT: 45 y.o. [place] woman status post left breast biopsy 06/15/2015 for a clinical T1 cN0, stage IA invasive ductal carcinoma, grade 3, estrogen and progesterone receptor negative, HER-2 amplified, with an MIB-1 of 40%  (1) neoadjuvant chemotherapy to consist of carboplatin, docetaxel, pertuzumab, and trastuzumab with onpro support every 3 weeks 6  (2) breast conserving surgery with sentinel lymph node sampling to follow  (3) adjuvant radiation to follow surgery  (4) trastuzumab to be continued to complete a year  (5) genetics testing pending  PLAN: Jaima's weight is up and down, but the trend is down 9lb in the past 4 weeks. I stressed the importance of protein. She is going to attempt to manage this through diet changes alone, as she is not able to take supplements containing whey. We will continue to monitor her weight, and I will refer her to nutrition if necessary to discuss options given her dietary restrictions.   We discussed her headaches, and they could be  tied to zofran use. I have asked her to lean on compazine more in the future, keeping in mind that this does make her drowsy during the day.   Her rash has improved, not worsened, with time. As a result, I am not convinced it is a sensitivity to pertuzumab. She is no longer on doxycycline, but has some on hand.   Versie will return in 2 week for  the start of cycle 3 of treatment. She understands and agrees with this plan. She knows the goal of treatment in her case is cure. She has been encouraged to call with any issues that might arise before her next visit here.     Laurie Panda, NP   08/08/2015 9:43 AM

## 2015-08-08 NOTE — Telephone Encounter (Signed)
Appointments made and avs printed for patient °

## 2015-08-10 ENCOUNTER — Encounter: Payer: Self-pay | Admitting: *Deleted

## 2015-08-17 ENCOUNTER — Encounter: Payer: Self-pay | Admitting: Genetic Counselor

## 2015-08-17 ENCOUNTER — Ambulatory Visit: Payer: Self-pay | Admitting: Genetic Counselor

## 2015-08-17 ENCOUNTER — Telehealth: Payer: Self-pay | Admitting: Genetic Counselor

## 2015-08-17 DIAGNOSIS — C50511 Malignant neoplasm of lower-outer quadrant of right female breast: Secondary | ICD-10-CM

## 2015-08-17 DIAGNOSIS — Z1379 Encounter for other screening for genetic and chromosomal anomalies: Secondary | ICD-10-CM | POA: Insufficient documentation

## 2015-08-17 DIAGNOSIS — Z800 Family history of malignant neoplasm of digestive organs: Secondary | ICD-10-CM

## 2015-08-17 NOTE — Telephone Encounter (Signed)
Revealed negative genetic testing on the breast/ovarian cancer panel.   

## 2015-08-17 NOTE — Progress Notes (Signed)
HPI: Ms. Stagner was previously seen i3n the [facility] clinic due to a personal history of breast cancer and family history of stomach cancer and concerns regarding a hereditary predisposition to cancer. Please refer to our prior cancer genetics clinic note for more information regarding Ms. Kasperski's medical, social and family histories, and our assessment and recommendations, at the time. Ms. Mathisen recent genetic test results were disclosed to her, as were recommendations warranted by these results. These results and recommendations are discussed in more detail below.  FAMILY HISTORY:  We obtained a detailed, 4-generation family history.  Significant diagnoses are listed below: Family History  Problem Relation Age of Onset  . Diabetes Mother   . Hypertension Mother   . Hyperlipidemia Mother   . Stomach cancer Maternal Grandfather 70  . Diabetes Paternal Grandmother   . Dementia Maternal Uncle   . Congestive Heart Failure Maternal Grandmother     The patient has one sister who is cancer free. Both parents are alive, but she does not keep in close contact with her father. Her mother had an intraductal papaloma several years ago. Her mother had three brothers, one who died with dementia. The patient's maternal grandmother died at 53 with CHF, and her grandfather died at 43 with cancer of hte stomach and urethra. The patient's father has tremors possibly related to his [place] tours. He had three brothers and one sister. There is no reported cancer on this side of the family. Patient's maternal ancestors are of [place] and [place] descent, and paternal ancestors are of [place], [place], [place] and [place] descent. There is no reported Ashkenazi Jewish ancestry. There is no known consanguinity.  GENETIC TEST RESULTS: At the time of Ms. Nine's visit, we recommended she pursue genetic testing of the Breast/Ovarian cancer gene panel. The Breast/Ovarian gene panel offered by  [facility] includes sequencing and rearrangement analysis for the following 20 genes:  ATM, BARD1, BRCA1, BRCA2, BRIP1, CDH1, CHEK2, EPCAM, FANCC, MLH1, MSH2, MSH6, NBN, PALB2, PMS2, PTEN, RAD51C, RAD51D, TP53, and XRCC2.   The report date is August 16, 2015.  Genetic testing was normal, and did not reveal a deleterious mutation in these genes. The test report has been scanned into EPIC and is located under the Molecular Pathology section of the Results Review tab.   We discussed with Ms. Fusselman that since the current genetic testing is not perfect, it is possible there may be a gene mutation in one of these genes that current testing cannot detect, but that chance is small. We also discussed, that it is possible that another gene that has not yet been discovered, or that we have not yet tested, is responsible for the cancer diagnoses in the family, and it is, therefore, important to remain in touch with cancer genetics in the future so that we can continue to offer Ms. Rochin the most up to date genetic testing.   CANCER SCREENING RECOMMENDATIONS: This result is reassuring and indicates that Ms. Dehaan likely does not have an increased risk for a future cancer due to a mutation in one of these genes. This normal test also suggests that Ms. Curington's cancer was most likely not due to an inherited predisposition associated with one of these genes.  Most cancers happen by chance and this negative test suggests that her cancer falls into this category.  We, therefore, recommended she continue to follow the cancer management and screening guidelines provided by her oncology and primary healthcare provider.   RECOMMENDATIONS FOR FAMILY  MEMBERS: Women in this family might be at some increased risk of developing cancer, over the general population risk, simply due to the family history of cancer. We recommended women in this family have a yearly mammogram beginning at age 72, or 78 years younger than the earliest  onset of cancer, an an annual clinical breast exam, and perform monthly breast self-exams. Women in this family should also have a gynecological exam as recommended by their primary provider. All family members should have a colonoscopy by age 74.  FOLLOW-UP: Lastly, we discussed with Ms. Ontko that cancer genetics is a rapidly advancing field and it is possible that new genetic tests will be appropriate for her and/or her family members in the future. We encouraged her to remain in contact with cancer genetics on an annual basis so we can update her personal and family histories and let her know of advances in cancer genetics that may benefit this family.   Our contact number was provided. Ms. Whiters questions were answered to her satisfaction, and she knows she is welcome to call us at anytime with additional questions or concerns.   Roma Kayser, MS, [facility] Certified Genetic Counselor [email or web address]

## 2015-08-22 ENCOUNTER — Other Ambulatory Visit (HOSPITAL_BASED_OUTPATIENT_CLINIC_OR_DEPARTMENT_OTHER): Payer: Medicaid Other

## 2015-08-22 ENCOUNTER — Other Ambulatory Visit: Payer: Self-pay | Admitting: Oncology

## 2015-08-22 ENCOUNTER — Ambulatory Visit (HOSPITAL_BASED_OUTPATIENT_CLINIC_OR_DEPARTMENT_OTHER): Payer: Medicaid Other

## 2015-08-22 ENCOUNTER — Encounter: Payer: Self-pay | Admitting: Nurse Practitioner

## 2015-08-22 ENCOUNTER — Ambulatory Visit (HOSPITAL_BASED_OUTPATIENT_CLINIC_OR_DEPARTMENT_OTHER): Payer: Medicaid Other | Admitting: Nurse Practitioner

## 2015-08-22 VITALS — BP 129/72 | HR 85 | Temp 97.9°F | Resp 19 | Wt 227.7 lb

## 2015-08-22 DIAGNOSIS — L739 Follicular disorder, unspecified: Secondary | ICD-10-CM | POA: Diagnosis not present

## 2015-08-22 DIAGNOSIS — C50511 Malignant neoplasm of lower-outer quadrant of right female breast: Secondary | ICD-10-CM

## 2015-08-22 DIAGNOSIS — C50912 Malignant neoplasm of unspecified site of left female breast: Secondary | ICD-10-CM

## 2015-08-22 DIAGNOSIS — R197 Diarrhea, unspecified: Secondary | ICD-10-CM

## 2015-08-22 DIAGNOSIS — K521 Toxic gastroenteritis and colitis: Secondary | ICD-10-CM

## 2015-08-22 DIAGNOSIS — R110 Nausea: Secondary | ICD-10-CM

## 2015-08-22 DIAGNOSIS — Z5112 Encounter for antineoplastic immunotherapy: Secondary | ICD-10-CM

## 2015-08-22 DIAGNOSIS — R51 Headache: Secondary | ICD-10-CM

## 2015-08-22 DIAGNOSIS — C50919 Malignant neoplasm of unspecified site of unspecified female breast: Secondary | ICD-10-CM | POA: Diagnosis present

## 2015-08-22 DIAGNOSIS — G4452 New daily persistent headache (NDPH): Secondary | ICD-10-CM

## 2015-08-22 DIAGNOSIS — Z5111 Encounter for antineoplastic chemotherapy: Secondary | ICD-10-CM | POA: Diagnosis not present

## 2015-08-22 LAB — CBC WITH DIFFERENTIAL/PLATELET
BASO%: 0.7 % (ref 0.0–2.0)
BASOS ABS: 0.1 10*3/uL (ref 0.0–0.1)
EOS ABS: 0 10*3/uL (ref 0.0–0.5)
EOS%: 0.1 % (ref 0.0–7.0)
HEMATOCRIT: 35.2 % (ref 34.8–46.6)
HGB: 11.7 g/dL (ref 11.6–15.9)
LYMPH#: 2 10*3/uL (ref 0.9–3.3)
LYMPH%: 22.4 % (ref 14.0–49.7)
MCH: 30.3 pg (ref 25.1–34.0)
MCHC: 33.2 g/dL (ref 31.5–36.0)
MCV: 91.3 fL (ref 79.5–101.0)
MONO#: 0.5 10*3/uL (ref 0.1–0.9)
MONO%: 5.8 % (ref 0.0–14.0)
NEUT#: 6.2 10*3/uL (ref 1.5–6.5)
NEUT%: 71 % (ref 38.4–76.8)
PLATELETS: 208 10*3/uL (ref 145–400)
RBC: 3.86 10*6/uL (ref 3.70–5.45)
RDW: 15.4 % — ABNORMAL HIGH (ref 11.2–14.5)
WBC: 8.7 10*3/uL (ref 3.9–10.3)

## 2015-08-22 LAB — COMPREHENSIVE METABOLIC PANEL
ALT: 11 U/L (ref 0–55)
ANION GAP: 10 meq/L (ref 3–11)
AST: 10 U/L (ref 5–34)
Albumin: 3.5 g/dL (ref 3.5–5.0)
Alkaline Phosphatase: 50 U/L (ref 40–150)
BILIRUBIN TOTAL: 0.31 mg/dL (ref 0.20–1.20)
BUN: 7.8 mg/dL (ref 7.0–26.0)
CALCIUM: 9.4 mg/dL (ref 8.4–10.4)
CHLORIDE: 107 meq/L (ref 98–109)
CO2: 24 mEq/L (ref 22–29)
CREATININE: 0.8 mg/dL (ref 0.6–1.1)
EGFR: >90 mL/min/{1.73_m2} (ref >90)
Glucose: 140 mg/dl (ref 70–140)
Potassium: 3.8 mEq/L (ref 3.5–5.1)
Sodium: 141 mEq/L (ref 136–145)
Total Protein: 6.6 g/dL (ref 6.4–8.3)

## 2015-08-22 MED ORDER — TRASTUZUMAB CHEMO INJECTION 440 MG
6.0000 mg/kg | Freq: Once | INTRAVENOUS | Status: AC
  Administered 2015-08-22: 609 mg via INTRAVENOUS
  Filled 2015-08-22: qty 29

## 2015-08-22 MED ORDER — ACETAMINOPHEN 325 MG PO TABS
ORAL_TABLET | ORAL | Status: AC
  Filled 2015-08-22: qty 2

## 2015-08-22 MED ORDER — DEXTROSE 5 % IV SOLN
75.0000 mg/m2 | Freq: Once | INTRAVENOUS | Status: AC
  Administered 2015-08-22: 160 mg via INTRAVENOUS
  Filled 2015-08-22: qty 16

## 2015-08-22 MED ORDER — HEPARIN SOD (PORK) LOCK FLUSH 100 UNIT/ML IV SOLN
500.0000 [IU] | Freq: Once | INTRAVENOUS | Status: AC | PRN
  Administered 2015-08-22: 500 [IU]
  Filled 2015-08-22: qty 5

## 2015-08-22 MED ORDER — SODIUM CHLORIDE 0.9 % IV SOLN
420.0000 mg | Freq: Once | INTRAVENOUS | Status: AC
  Administered 2015-08-22: 420 mg via INTRAVENOUS
  Filled 2015-08-22: qty 14

## 2015-08-22 MED ORDER — PEGFILGRASTIM 6 MG/0.6ML ~~LOC~~ PSKT
6.0000 mg | PREFILLED_SYRINGE | Freq: Once | SUBCUTANEOUS | Status: AC
  Administered 2015-08-22: 6 mg via SUBCUTANEOUS
  Filled 2015-08-22: qty 0.6

## 2015-08-22 MED ORDER — ACETAMINOPHEN 325 MG PO TABS
650.0000 mg | ORAL_TABLET | Freq: Once | ORAL | Status: AC
  Administered 2015-08-22: 650 mg via ORAL

## 2015-08-22 MED ORDER — SODIUM CHLORIDE 0.9 % IV SOLN
Freq: Once | INTRAVENOUS | Status: AC
  Administered 2015-08-22: 10:00:00 via INTRAVENOUS

## 2015-08-22 MED ORDER — SODIUM CHLORIDE 0.9 % IV SOLN
Freq: Once | INTRAVENOUS | Status: AC
  Administered 2015-08-22: 10:00:00 via INTRAVENOUS
  Filled 2015-08-22: qty 8

## 2015-08-22 MED ORDER — SODIUM CHLORIDE 0.9 % IV SOLN
750.0000 mg | Freq: Once | INTRAVENOUS | Status: AC
  Administered 2015-08-22: 750 mg via INTRAVENOUS
  Filled 2015-08-22: qty 75

## 2015-08-22 MED ORDER — DIPHENHYDRAMINE HCL 25 MG PO CAPS
ORAL_CAPSULE | ORAL | Status: AC
  Filled 2015-08-22: qty 1

## 2015-08-22 MED ORDER — SODIUM CHLORIDE 0.9 % IJ SOLN
10.0000 mL | INTRAMUSCULAR | Status: DC | PRN
  Administered 2015-08-22: 10 mL
  Filled 2015-08-22: qty 10

## 2015-08-22 MED ORDER — DIPHENHYDRAMINE HCL 25 MG PO CAPS
25.0000 mg | ORAL_CAPSULE | Freq: Once | ORAL | Status: AC
  Administered 2015-08-22: 25 mg via ORAL

## 2015-08-22 NOTE — Progress Notes (Signed)
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 She brought it to medical attention when she presented for the free Pap smear screening clinic here 06/01/2015. Exam on that day showed a lump in the right breast at the 4:00 position under the areola. There was also a scar on the right outer breast. This scar was noted in 01/04/2008 when she had her baseline mammogram at [facility]. It had been stable for a year at that time and the patient had treated it with a "blood root paste" which caused scarring. 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 It is in a separate location from the new mass.  On 06/05/2015 [name] underwent bilateral diagnostic mammography at [facility], with bilateral ultrasonography. This found the breast density to be category B. There was a 2.5 cm oval mass in the right breast at the 5:00 position which by ultrasound measured 2.1 cm. In the left breast there was an area of focal asymmetry which by ultrasound was consistent with fibroglandular tissue/benign.  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  Her subsequent history is as detailed below.  INTERVAL HISTORY: Danene returns for follow up of her breast cancer, accompanied by her sister.  Today is day 1, cycle 3 of carboplatin, docetaxel, trastuzumab, and pertuzumab, with neulasta onpro given on day 2 for granulocyte support.  REVIEW OF SYSTEMS: Marceil denies fevers or chills. She continues to have mild waves of nausea. She is staying well hydrated. Her appetite is fair. She has gained back 6lb that she lost initially with the first 2 cycles of treatment. She still has taste perversion, especially with red meats. She was using imodium for her diarrhea, but stopped it prematurely, and it has returned. Her scalp is irritated with a speckle of red bumps that are itchy. She has had a week straight of headaches that are not long lasting but frequently. Sometimes with 10+ a day, lasting from 30 seconds to 3 minutes. She is not taking anything for these because they are so short. She has had some mild ankle swelling when on her feet for more than a few hours. She sleeps well with a lavender diffuser. A detailed review of systems is otherwise stable.  PAST MEDICAL HISTORY: Past Medical History  Diagnosis Date  . Breast cancer of lower-outer quadrant of right female breast ([place]) 06/20/2015  . Breast cancer ([place])   . Arthritis   . Family history of stomach cancer     PAST SURGICAL HISTORY: Past Surgical History  Procedure Laterality Date  . Wisdom tooth extraction    . Portacath placement Left 07/06/2015    Procedure: INSERTION PORT-A-CATH;  Surgeon: Autumn Messing  III, MD;  Location: [place];  Service: General;  Laterality: Left;    FAMILY HISTORY Family History  Problem Relation Age of Onset  . Diabetes Mother   . Hypertension Mother   . Hyperlipidemia Mother   . Stomach cancer Maternal Grandfather 4  . Diabetes Paternal Grandmother   . Dementia  Maternal Uncle   . Congestive Heart Failure Maternal Grandmother    the patient's parents are both living, in their early 68s. The patient had no brother, one sister. The patient's mother works by Education administrator homes for private clients. The patient's sister is a Pharmacist, hospital in [place] there is no history of breast or ovarian cancer in the family. The patient's paternal grandfather died at the age of 72 from stomach cancer felt to be related to iron unusual toxic exposure   GYNECOLOGIC HISTORY:  No LMP recorded.  menarche age 75, she is [place] P0  she is still having periods but they're quite sporadic. They may occur 6 weeks apart or 3 weeks apart, may be heavy or only spotting. She used oral contraceptives for more than 20 years with no complications   SOCIAL HISTORY:  Cheris works multiple jobs as Haematologist, Designer, industrial/product, Radiation protection practitioner, and other part-time jobs. She is divorced and at home lives with 6 dogs.     ADVANCED DIRECTIVES:  not in place    HEALTH MAINTENANCE: Social History  Substance Use Topics  . Smoking status: Former Smoker    Types: Cigarettes    Quit date: 07/04/2010  . Smokeless tobacco: Never Used  . Alcohol Use: No     Colonoscopy:  PAP: October 2016   Bone density:  Lipid panel:  Allergies  Allergen Reactions  . Cheese   . Dairy Aid [Lactase]     "sinus infections"  . Whey     Sinus issues  . 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 dexamethasone (DECADRON) 4 MG tablet Take 2 tablets (8 mg total) by mouth 2 (two) times daily. Start the day before Taxotere. Then again the day after chemo for 3 days. 30 tablet 1  . lidocaine-prilocaine (EMLA) cream Apply to affected area once 30 g 3  . ondansetron (ZOFRAN) 8 MG tablet Take 1 tablet (8 mg total) by mouth 2 (two) times daily. Start the day after chemo for 3 days. Then take as needed for nausea or vomiting.  30 tablet 1  . acetaminophen (TYLENOL) 500 MG tablet Take 500 mg by mouth every 6 (six) hours as needed for mild pain. Reported on 08/22/2015    . doxycycline (VIBRA-TABS) 100 MG tablet Take 1 tablet (100 mg total) by mouth 2 (two) times daily. (Patient not taking: Reported on 07/28/2015) 30 tablet 1  . ibuprofen (ADVIL,MOTRIN) 200 MG tablet Take 200 mg by mouth every 6 (six) hours as needed for mild pain. Reported on 08/22/2015    . LORazepam (ATIVAN) 0.5 MG tablet Take 1 tablet (0.5 mg total) by mouth at bedtime. (Patient not taking: Reported on 07/28/2015) 30 tablet 0  . oxyCODONE-acetaminophen (ROXICET) 5-325 MG tablet Take 1-2 tablets by mouth every 4 (four) hours as needed. (Patient not taking: Reported on 07/28/2015) 50 tablet 0  . prochlorperazine (COMPAZINE) 10 MG tablet Take 1 tablet (10 mg total) by mouth every 6 (six) hours as needed (Nausea or vomiting). (Patient not taking: Reported on 07/28/2015) 30 tablet 1  No current facility-administered medications for this visit.    OBJECTIVE: Middle-aged white woman in no acute distress  Filed Vitals:   08/22/15 0857  BP: 129/72  Pulse: 85  Temp: 97.9 F (36.6 C)  Resp: 19     Body mass index is 35.65 kg/(m^2).    ECOG FS:0 - Asymptomatic   Skin: warm, dry, erythematous papules to chest, back, and scalp HEENT: sclerae anicteric, conjunctivae pink, oropharynx clear. No thrush or mucositis.  Lymph Nodes: No cervical or supraclavicular lymphadenopathy  Lungs: clear to auscultation bilaterally, no rales, wheezes, or rhonci  Heart: regular rate and rhythm  Abdomen: round, soft, non tender, positive bowel sounds  Musculoskeletal: No focal spinal tenderness, no peripheral edema  Neuro: non focal, well oriented, positive affect  Breasts: deferred   LAB RESULTS:  CMP     Component Value Date/Time   NA 141 08/22/2015 0841   K 3.8 08/22/2015 0841   CO2 24 08/22/2015 0841   GLUCOSE 140 08/22/2015 0841   BUN 7.8 08/22/2015 0841    CREATININE 0.8 08/22/2015 0841   CALCIUM 9.4 08/22/2015 0841   PROT 6.6 08/22/2015 0841   ALBUMIN 3.5 08/22/2015 0841   AST 10 08/22/2015 0841   ALT 11 08/22/2015 0841   ALKPHOS 50 08/22/2015 0841   BILITOT 0.31 08/22/2015 0841    INo results found for: SPEP, UPEP  Lab Results  Component Value Date   WBC 8.7 08/22/2015   NEUTROABS 6.2 08/22/2015   HGB 11.7 08/22/2015   HCT 35.2 08/22/2015   MCV 91.3 08/22/2015   PLT 208 08/22/2015      Chemistry      Component Value Date/Time   NA 141 08/22/2015 0841   K 3.8 08/22/2015 0841   CO2 24 08/22/2015 0841   BUN 7.8 08/22/2015 0841   CREATININE 0.8 08/22/2015 0841      Component Value Date/Time   CALCIUM 9.4 08/22/2015 0841   ALKPHOS 50 08/22/2015 0841   AST 10 08/22/2015 0841   ALT 11 08/22/2015 0841   BILITOT 0.31 08/22/2015 0841       No results found for: LABCA2  No components found for: LABCA125  No results for input(s): INR in the last 168 hours.  Urinalysis No results found for: COLORURINE, APPEARANCEUR, LABSPEC, PHURINE, GLUCOSEU, HGBUR, BILIRUBINUR, KETONESUR, PROTEINUR, UROBILINOGEN, NITRITE, LEUKOCYTESUR  STUDIES: No results found.  ASSESSMENT: 45 y.o. [place] woman status post left breast biopsy 06/15/2015 for a clinical T1 cN0, stage IA invasive ductal carcinoma, grade 3, estrogen and progesterone receptor negative, HER-2 amplified, with an MIB-1 of 40%  (1) neoadjuvant chemotherapy to consist of carboplatin, docetaxel, pertuzumab, and trastuzumab with onpro support every 3 weeks 6  (2) breast conserving surgery with sentinel lymph node sampling to follow  (3) adjuvant radiation to follow surgery  (4) trastuzumab to be continued to complete a year  (5) genetics testing pending  PLAN: Destanie is stable today. The labs were reviewed in detail and were entirely normal. She will proceed with cycle 3 of carboplatin, docetaxel., pertuzumab, and trastuzumab as planned.   She will avoid zofran use  in an effort to minimize headaches. She denies vision changes, aura, or weakness.  I have advised she try selsun blue or another medicated shampoo for her folliculitis.   She will continue use of imodium PRN for her diarrhea. She declined the need for an additional medicine to control this.   Emmely will return in 1 week for labs and a nadir visit. She understands and  agrees with this plan. She knows the goal of treatment in her case is cure. She has been encouraged to call with any issues that might arise before her next visit here.    Laurie Panda, NP   08/22/2015 9:27 AM

## 2015-08-22 NOTE — Patient Instructions (Signed)
[facility] Discharge Instructions for Patients Receiving Chemotherapy  Today you received the following chemotherapy agents Taxol, Carboplatin, Herceptin and Perjeta.  To help prevent nausea and vomiting after your treatment, we encourage you to take your nausea medication as directed.  If you develop nausea and vomiting that is not controlled by your nausea medication, call the clinic.   BELOW ARE SYMPTOMS THAT SHOULD BE REPORTED IMMEDIATELY:  *FEVER GREATER THAN 100.5 F  *CHILLS WITH OR WITHOUT FEVER  NAUSEA AND VOMITING THAT IS NOT CONTROLLED WITH YOUR NAUSEA MEDICATION  *UNUSUAL SHORTNESS OF BREATH  *UNUSUAL BRUISING OR BLEEDING  TENDERNESS IN MOUTH AND THROAT WITH OR WITHOUT PRESENCE OF ULCERS  *URINARY PROBLEMS  *BOWEL PROBLEMS  UNUSUAL RASH Items with * indicate a potential emergency and should be followed up as soon as possible.  Feel free to call the clinic you have any questions or concerns. The clinic phone number is [phone].  Please show the [place] at check-in to the Emergency Department and triage nurse.

## 2015-08-29 ENCOUNTER — Ambulatory Visit (HOSPITAL_BASED_OUTPATIENT_CLINIC_OR_DEPARTMENT_OTHER): Payer: Medicaid Other | Admitting: Nurse Practitioner

## 2015-08-29 ENCOUNTER — Other Ambulatory Visit (HOSPITAL_BASED_OUTPATIENT_CLINIC_OR_DEPARTMENT_OTHER): Payer: Medicaid Other

## 2015-08-29 ENCOUNTER — Encounter: Payer: Self-pay | Admitting: Nurse Practitioner

## 2015-08-29 ENCOUNTER — Encounter: Payer: Self-pay | Admitting: *Deleted

## 2015-08-29 VITALS — BP 104/68 | HR 110 | Temp 98.0°F | Resp 18 | Ht 67.0 in | Wt 218.8 lb

## 2015-08-29 DIAGNOSIS — C50511 Malignant neoplasm of lower-outer quadrant of right female breast: Secondary | ICD-10-CM

## 2015-08-29 DIAGNOSIS — E860 Dehydration: Secondary | ICD-10-CM

## 2015-08-29 DIAGNOSIS — R197 Diarrhea, unspecified: Secondary | ICD-10-CM

## 2015-08-29 DIAGNOSIS — T451X5A Adverse effect of antineoplastic and immunosuppressive drugs, initial encounter: Secondary | ICD-10-CM

## 2015-08-29 DIAGNOSIS — C50911 Malignant neoplasm of unspecified site of right female breast: Secondary | ICD-10-CM

## 2015-08-29 DIAGNOSIS — C50919 Malignant neoplasm of unspecified site of unspecified female breast: Secondary | ICD-10-CM

## 2015-08-29 DIAGNOSIS — R112 Nausea with vomiting, unspecified: Secondary | ICD-10-CM | POA: Diagnosis not present

## 2015-08-29 LAB — CBC WITH DIFFERENTIAL/PLATELET
BASO%: 0.3 % (ref 0.0–2.0)
BASOS ABS: 0 10*3/uL (ref 0.0–0.1)
EOS ABS: 0 10*3/uL (ref 0.0–0.5)
EOS%: 0.8 % (ref 0.0–7.0)
HEMATOCRIT: 35.7 % (ref 34.8–46.6)
HEMOGLOBIN: 11.8 g/dL (ref 11.6–15.9)
LYMPH%: 38.6 % (ref 14.0–49.7)
MCH: 30.5 pg (ref 25.1–34.0)
MCHC: 33.2 g/dL (ref 31.5–36.0)
MCV: 92 fL (ref 79.5–101.0)
MONO#: 0.6 10*3/uL (ref 0.1–0.9)
MONO%: 12 % (ref 0.0–14.0)
NEUT#: 2.6 10*3/uL (ref 1.5–6.5)
NEUT%: 48.3 % (ref 38.4–76.8)
Platelets: 232 10*3/uL (ref 145–400)
RBC: 3.88 10*6/uL (ref 3.70–5.45)
RDW: 15.1 % — AB (ref 11.2–14.5)
WBC: 5.3 10*3/uL (ref 3.9–10.3)
lymph#: 2.1 10*3/uL (ref 0.9–3.3)

## 2015-08-29 NOTE — Progress Notes (Signed)
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 She brought it to medical attention when she presented for the free Pap smear screening clinic here 06/01/2015. Exam on that day showed a lump in the right breast at the 4:00 position under the areola. There was also a scar on the right outer breast. This scar was noted in 01/04/2008 when she had her baseline mammogram at [facility]. It had been stable for a year at that time and the patient had treated it with a "blood root paste" which caused scarring. 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 It is in a separate location from the new mass.  On 06/05/2015 Phyllis Wiggins underwent bilateral diagnostic mammography at [facility], with bilateral ultrasonography. This found the breast density to be category B. There was a 2.5 cm oval mass in the right breast at the 5:00 position which by ultrasound measured 2.1 cm. In the left breast there was an area of focal asymmetry which by ultrasound was consistent with fibroglandular tissue/benign.  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  Her subsequent history is as detailed below.  INTERVAL HISTORY: Phyllis Wiggins returns for follow up of her breast cancer, alone.  Today is day 8, cycle 3 of carboplatin, docetaxel, trastuzumab, and pertuzumab, with neulasta onpro given on day 2 for granulocyte support.  REVIEW OF SYSTEMS: Phyllis Wiggins feels better today, but this past cycle was the worst one she has had to endure so far. She has lost 9lb in the past week. She had heavy nausea that lasted for 3-4 days with at least 2 episodes of vomiting. She used dexamethasone and compazine alone because we figured the zofran was causing her frequent headaches with the last cycle. Of course since she has been off of this med she has had no headaches. She was unable keep down food or water for about 36 hours. She had diarrhea 4-5 times, but this was managed well with imodium. She had had at least 64oz to drink in the past 12-18 hours, but none of it tastes good and she is having a hard time forcing the fluids down. She had a period that started last week and lasted for 3-4 days. She had no advancement of her rash, if anything it has improved. She is having no irritation to her scalp. She is sleeping reasonably well, but is noticeably fatigued. She denies mouth sores. She had no fevers or chills. A detailed review of systems is otherwise stable.  PAST MEDICAL HISTORY: Past Medical History  Diagnosis Date  . Breast cancer of lower-outer quadrant of right female breast ([name]) 06/20/2015  . Breast cancer ([place])   . Arthritis   .  Family history of stomach cancer     PAST SURGICAL HISTORY: Past Surgical History  Procedure Laterality Date  . Wisdom tooth extraction    . Portacath placement Left 07/06/2015    Procedure: INSERTION PORT-A-CATH;  Surgeon: Autumn Messing III, MD;  Location: [facility] OR;  Service: General;  Laterality: Left;    FAMILY HISTORY Family History  Problem  Relation Age of Onset  . Diabetes Mother   . Hypertension Mother   . Hyperlipidemia Mother   . Stomach cancer Maternal Grandfather 73  . Diabetes Paternal Grandmother   . Dementia Maternal Uncle   . Congestive Heart Failure Maternal Grandmother    the patient's parents are both living, in their early 57s. The patient had no brother, one sister. The patient's mother works by Education administrator homes for private clients. The patient's sister is a Pharmacist, hospital in [place] there is no history of breast or ovarian cancer in the family. The patient's paternal grandfather died at the age of 35 from stomach cancer felt to be related to iron unusual toxic exposure   GYNECOLOGIC HISTORY:  No LMP recorded.  menarche age 74, she is Phyllis Wiggins P0  she is still having periods but they're quite sporadic. They may occur 6 weeks apart or 3 weeks apart, may be heavy or only spotting. 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 She is divorced and at home lives with 6 dogs.     ADVANCED DIRECTIVES:  not in place    HEALTH MAINTENANCE: Social History  Substance Use Topics  . Smoking status: Former Smoker    Types: Cigarettes    Quit date: 07/04/2010  . Smokeless tobacco: Never Used  . Alcohol Use: No     Colonoscopy:  PAP: October 2016   Bone density:  Lipid panel:  Allergies  Allergen Reactions  . Cheese   . Dairy Aid [Lactase]     "sinus infections"  . Whey     Sinus issues  . 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 dexamethasone (DECADRON) 4 MG tablet Take 2 tablets (8 mg total) by mouth 2 (two) times daily. Start the day before Taxotere. Then again the day after chemo for 3 days. 30 tablet 1  . lidocaine-prilocaine (EMLA) cream Apply to affected area  once 30 g 3  . prochlorperazine (COMPAZINE) 10 MG tablet Take 1 tablet (10 mg total) by mouth every 6 (six) hours as needed (Nausea or vomiting). 30 tablet 1  . acetaminophen (TYLENOL) 500 MG tablet Take 500 mg by mouth every 6 (six) hours as needed for mild pain. Reported on 08/29/2015    . doxycycline (VIBRA-TABS) 100 MG tablet Take 1 tablet (100 mg total) by mouth 2 (two) times daily. (Patient not taking: Reported on 07/28/2015) 30 tablet 1  . ibuprofen (ADVIL,MOTRIN) 200 MG tablet Take 200 mg by mouth every 6 (six) hours as needed for mild pain. Reported on 08/29/2015    . LORazepam (ATIVAN) 0.5 MG tablet Take 1 tablet (0.5 mg total) by mouth at bedtime. (Patient not taking: Reported on 07/28/2015) 30 tablet 0  . ondansetron (ZOFRAN) 8 MG tablet Take 1 tablet (8 mg total) by mouth 2 (two) times daily. Start the day after chemo for 3 days. Then take as needed  for nausea or vomiting. (Patient not taking: Reported on 08/29/2015) 30 tablet 1  . oxyCODONE-acetaminophen (ROXICET) 5-325 MG tablet Take 1-2 tablets by mouth every 4 (four) hours as needed. (Patient not taking: Reported on 07/28/2015) 50 tablet 0   No current facility-administered medications for this visit.    OBJECTIVE: Middle-aged white woman in no acute distress  Filed Vitals:   08/29/15 0838  BP: 104/68  Pulse: 110  Temp: 98 F (36.7 C)  Resp: 18     Body mass index is 34.26 kg/(m^2).    ECOG FS:0 - Asymptomatic   Skin warm and dry, erythematous papules to chest, back, and face improving Sclerae unicteric, pupils round and equal Oropharynx clear and moist-- no thrush or other lesions No cervical or supraclavicular adenopathy Lungs no rales or rhonchi Heart regular rate and rhythm Abd soft, nontender, positive bowel sounds MSK no focal spinal tenderness, no upper extremity lymphedema Neuro: nonfocal, well oriented, appropriate affect Breasts: deferred  LAB RESULTS:  CMP     Component Value Date/Time   NA 141  08/22/2015 0841   K 3.8 08/22/2015 0841   CO2 24 08/22/2015 0841   GLUCOSE 140 08/22/2015 0841   BUN 7.8 08/22/2015 0841   CREATININE 0.8 08/22/2015 0841   CALCIUM 9.4 08/22/2015 0841   PROT 6.6 08/22/2015 0841   ALBUMIN 3.5 08/22/2015 0841   AST 10 08/22/2015 0841   ALT 11 08/22/2015 0841   ALKPHOS 50 08/22/2015 0841   BILITOT 0.31 08/22/2015 0841    INo results found for: SPEP, UPEP  Lab Results  Component Value Date   WBC 5.3 08/29/2015   NEUTROABS 2.6 08/29/2015   HGB 11.8 08/29/2015   HCT 35.7 08/29/2015   MCV 92.0 08/29/2015   PLT 232 08/29/2015      Chemistry      Component Value Date/Time   NA 141 08/22/2015 0841   K 3.8 08/22/2015 0841   CO2 24 08/22/2015 0841   BUN 7.8 08/22/2015 0841   CREATININE 0.8 08/22/2015 0841      Component Value Date/Time   CALCIUM 9.4 08/22/2015 0841   ALKPHOS 50 08/22/2015 0841   AST 10 08/22/2015 0841   ALT 11 08/22/2015 0841   BILITOT 0.31 08/22/2015 0841       No results found for: LABCA2  No components found for: LABCA125  No results for input(s): INR in the last 168 hours.  Urinalysis No results found for: COLORURINE, APPEARANCEUR, LABSPEC, PHURINE, GLUCOSEU, HGBUR, BILIRUBINUR, KETONESUR, PROTEINUR, UROBILINOGEN, NITRITE, LEUKOCYTESUR  STUDIES: No results found.  ASSESSMENT: 45 y.o. [place] woman status post left breast biopsy 06/15/2015 for a clinical T1 cN0, stage IA invasive ductal carcinoma, grade 3, estrogen and progesterone receptor negative, HER-2 amplified, with an MIB-1 of 40%  (1) neoadjuvant chemotherapy to consist of carboplatin, docetaxel, pertuzumab, and trastuzumab with onpro support every 3 weeks 6  (2) breast conserving surgery with sentinel lymph node sampling to follow  (3) adjuvant radiation to follow surgery  (4) trastuzumab to be continued to complete a year  (5) genetics testing pending  PLAN: Emry had a rough week, but she managed each symptom remarkably well. I reminded  her that if she were to get this dehydrated again or if she was unable to keep down food or drinks for over 24 hours, to call this facility. We would be more than happy to give her IV fluids. She declined the need today.   The CBC was reviewed in detail and was entirely normal.  Valory plans to return to zofran use with the next cycle, despite the fact that they caused her frequent headaches. She would prefer to deal with those over the intense nausea she experienced this time. She will continue imodium PRN for diarrhea.   Nilah will return in 2 weeks for cycle 4 of docetaxel and carboplatin . She understands and agrees with this plan. She knows the goal of treatment in her case is cure. She has been encouraged to call with any issues that might arise before her next visit here.    Laurie Panda, NP   08/29/2015 12:20 PM

## 2015-09-12 ENCOUNTER — Other Ambulatory Visit: Payer: Self-pay | Admitting: Oncology

## 2015-09-12 ENCOUNTER — Encounter: Payer: Self-pay | Admitting: General Practice

## 2015-09-12 ENCOUNTER — Encounter: Payer: Self-pay | Admitting: Nurse Practitioner

## 2015-09-12 ENCOUNTER — Telehealth: Payer: Self-pay | Admitting: Nurse Practitioner

## 2015-09-12 ENCOUNTER — Encounter: Payer: Self-pay | Admitting: *Deleted

## 2015-09-12 ENCOUNTER — Ambulatory Visit (HOSPITAL_BASED_OUTPATIENT_CLINIC_OR_DEPARTMENT_OTHER): Payer: Medicaid Other | Admitting: Nurse Practitioner

## 2015-09-12 ENCOUNTER — Ambulatory Visit (HOSPITAL_BASED_OUTPATIENT_CLINIC_OR_DEPARTMENT_OTHER): Payer: Medicaid Other

## 2015-09-12 ENCOUNTER — Other Ambulatory Visit (HOSPITAL_BASED_OUTPATIENT_CLINIC_OR_DEPARTMENT_OTHER): Payer: Medicaid Other

## 2015-09-12 VITALS — BP 125/68 | HR 108 | Temp 98.1°F | Resp 18 | Ht 67.0 in | Wt 222.2 lb

## 2015-09-12 VITALS — HR 99

## 2015-09-12 DIAGNOSIS — Z5112 Encounter for antineoplastic immunotherapy: Secondary | ICD-10-CM

## 2015-09-12 DIAGNOSIS — Z5111 Encounter for antineoplastic chemotherapy: Secondary | ICD-10-CM | POA: Diagnosis not present

## 2015-09-12 DIAGNOSIS — R509 Fever, unspecified: Secondary | ICD-10-CM

## 2015-09-12 DIAGNOSIS — C50912 Malignant neoplasm of unspecified site of left female breast: Secondary | ICD-10-CM

## 2015-09-12 DIAGNOSIS — C50511 Malignant neoplasm of lower-outer quadrant of right female breast: Secondary | ICD-10-CM | POA: Diagnosis not present

## 2015-09-12 DIAGNOSIS — R197 Diarrhea, unspecified: Secondary | ICD-10-CM

## 2015-09-12 DIAGNOSIS — M25473 Effusion, unspecified ankle: Secondary | ICD-10-CM

## 2015-09-12 DIAGNOSIS — R609 Edema, unspecified: Secondary | ICD-10-CM

## 2015-09-12 DIAGNOSIS — T451X5A Adverse effect of antineoplastic and immunosuppressive drugs, initial encounter: Secondary | ICD-10-CM

## 2015-09-12 DIAGNOSIS — C50919 Malignant neoplasm of unspecified site of unspecified female breast: Secondary | ICD-10-CM | POA: Diagnosis not present

## 2015-09-12 DIAGNOSIS — R110 Nausea: Secondary | ICD-10-CM

## 2015-09-12 DIAGNOSIS — R112 Nausea with vomiting, unspecified: Secondary | ICD-10-CM

## 2015-09-12 LAB — CBC WITH DIFFERENTIAL/PLATELET
BASO%: 0.2 % (ref 0.0–2.0)
BASOS ABS: 0 10*3/uL (ref 0.0–0.1)
EOS ABS: 0 10*3/uL (ref 0.0–0.5)
EOS%: 0 % (ref 0.0–7.0)
HEMATOCRIT: 34.8 % (ref 34.8–46.6)
HEMOGLOBIN: 11.9 g/dL (ref 11.6–15.9)
LYMPH#: 0.9 10*3/uL (ref 0.9–3.3)
LYMPH%: 12 % — ABNORMAL LOW (ref 14.0–49.7)
MCH: 31.6 pg (ref 25.1–34.0)
MCHC: 34.1 g/dL (ref 31.5–36.0)
MCV: 92.8 fL (ref 79.5–101.0)
MONO#: 0.1 10*3/uL (ref 0.1–0.9)
MONO%: 1.6 % (ref 0.0–14.0)
NEUT%: 86.2 % — ABNORMAL HIGH (ref 38.4–76.8)
NEUTROS ABS: 6.3 10*3/uL (ref 1.5–6.5)
PLATELETS: 205 10*3/uL (ref 145–400)
RBC: 3.75 10*6/uL (ref 3.70–5.45)
RDW: 17 % — AB (ref 11.2–14.5)
WBC: 7.3 10*3/uL (ref 3.9–10.3)

## 2015-09-12 LAB — COMPREHENSIVE METABOLIC PANEL
ALBUMIN: 3.4 g/dL — AB (ref 3.5–5.0)
ALK PHOS: 52 U/L (ref 40–150)
ALT: 16 U/L (ref 0–55)
AST: 16 U/L (ref 5–34)
Anion Gap: 13 mEq/L — ABNORMAL HIGH (ref 3–11)
BILIRUBIN TOTAL: 0.45 mg/dL (ref 0.20–1.20)
BUN: 10.6 mg/dL (ref 7.0–26.0)
CALCIUM: 9.6 mg/dL (ref 8.4–10.4)
CO2: 21 mEq/L — ABNORMAL LOW (ref 22–29)
Chloride: 105 mEq/L (ref 98–109)
Creatinine: 0.8 mg/dL (ref 0.6–1.1)
GLUCOSE: 142 mg/dL — AB (ref 70–140)
POTASSIUM: 4 meq/L (ref 3.5–5.1)
SODIUM: 139 meq/L (ref 136–145)
TOTAL PROTEIN: 6.7 g/dL (ref 6.4–8.3)

## 2015-09-12 MED ORDER — SODIUM CHLORIDE 0.9 % IV SOLN
420.0000 mg | Freq: Once | INTRAVENOUS | Status: AC
  Administered 2015-09-12: 420 mg via INTRAVENOUS
  Filled 2015-09-12: qty 14

## 2015-09-12 MED ORDER — TRASTUZUMAB CHEMO INJECTION 440 MG
6.0000 mg/kg | Freq: Once | INTRAVENOUS | Status: AC
  Administered 2015-09-12: 609 mg via INTRAVENOUS
  Filled 2015-09-12: qty 29

## 2015-09-12 MED ORDER — DOCETAXEL CHEMO INJECTION 160 MG/16ML
75.0000 mg/m2 | Freq: Once | INTRAVENOUS | Status: AC
  Administered 2015-09-12: 160 mg via INTRAVENOUS
  Filled 2015-09-12: qty 16

## 2015-09-12 MED ORDER — SODIUM CHLORIDE 0.9 % IJ SOLN
10.0000 mL | INTRAMUSCULAR | Status: DC | PRN
  Administered 2015-09-12: 10 mL
  Filled 2015-09-12: qty 10

## 2015-09-12 MED ORDER — ACETAMINOPHEN 325 MG PO TABS
650.0000 mg | ORAL_TABLET | Freq: Once | ORAL | Status: AC
  Administered 2015-09-12: 650 mg via ORAL

## 2015-09-12 MED ORDER — CARBOPLATIN CHEMO INJECTION 600 MG/60ML
750.0000 mg | Freq: Once | INTRAVENOUS | Status: AC
  Administered 2015-09-12: 750 mg via INTRAVENOUS
  Filled 2015-09-12: qty 75

## 2015-09-12 MED ORDER — SODIUM CHLORIDE 0.9 % IV SOLN
Freq: Once | INTRAVENOUS | Status: AC
  Administered 2015-09-12: 10:00:00 via INTRAVENOUS

## 2015-09-12 MED ORDER — HEPARIN SOD (PORK) LOCK FLUSH 100 UNIT/ML IV SOLN
500.0000 [IU] | Freq: Once | INTRAVENOUS | Status: AC | PRN
  Administered 2015-09-12: 500 [IU]
  Filled 2015-09-12: qty 5

## 2015-09-12 MED ORDER — DIPHENHYDRAMINE HCL 25 MG PO CAPS
ORAL_CAPSULE | ORAL | Status: AC
  Filled 2015-09-12: qty 1

## 2015-09-12 MED ORDER — DIPHENHYDRAMINE HCL 25 MG PO CAPS
25.0000 mg | ORAL_CAPSULE | Freq: Once | ORAL | Status: AC
  Administered 2015-09-12: 25 mg via ORAL

## 2015-09-12 MED ORDER — PEGFILGRASTIM 6 MG/0.6ML ~~LOC~~ PSKT
6.0000 mg | PREFILLED_SYRINGE | Freq: Once | SUBCUTANEOUS | Status: AC
  Administered 2015-09-12: 6 mg via SUBCUTANEOUS
  Filled 2015-09-12: qty 0.6

## 2015-09-12 MED ORDER — ACETAMINOPHEN 325 MG PO TABS
ORAL_TABLET | ORAL | Status: AC
  Filled 2015-09-12: qty 2

## 2015-09-12 MED ORDER — PROCHLORPERAZINE MALEATE 10 MG PO TABS: 10.0000 mg | ORAL_TABLET | Freq: Four times a day (QID) | ORAL | Status: DC | PRN

## 2015-09-12 MED ORDER — SODIUM CHLORIDE 0.9 % IV SOLN
Freq: Once | INTRAVENOUS | Status: AC
  Administered 2015-09-12: 10:00:00 via INTRAVENOUS
  Filled 2015-09-12: qty 8

## 2015-09-12 MED ORDER — DEXAMETHASONE 4 MG PO TABS: 8.0000 mg | ORAL_TABLET | Freq: Two times a day (BID) | ORAL | Status: DC

## 2015-09-12 MED FILL — DEXAMETHASONE 4 MG TABLET: 4 | 7 days supply | Qty: 30 | Fill #0

## 2015-09-12 MED FILL — PROCHLORPERAZINE 10 MG TAB: 10 | 7 days supply | Qty: 30 | Fill #0

## 2015-09-12 NOTE — Progress Notes (Signed)
Spiritual Care Note  Acquainted with [name] through her community work with P[employer] consultant.  Spoke with her in infusion to offer emotional support and to introduce myself in my formal role at [facility].  She and her sister Jackelyn Poling used opportunity well to share and [employer] story, verbalizing appreciation for support.  Provided pastoral presence, reflective listening, normalization of feelings, and naming of grief.  In the past year, Angeliz has experienced losses of several dear pets, three by cancer, which complicates the context of her own dx/tx.  Family aware of ongoing chaplain/Support Center team availability, but please also page as needs arise.  Thank you.  [place], [place], [place] Pager [phone] Voicemail  [phone]

## 2015-09-12 NOTE — Patient Instructions (Signed)
[facility] Discharge Instructions for Patients Receiving Chemotherapy  Today you received the following chemotherapy agents: Herceptin, Perjeta, Taxotere, Carboplatin   To help prevent nausea and vomiting after your treatment, we encourage you to take your nausea medication as directed.    If you develop nausea and vomiting that is not controlled by your nausea medication, call the clinic.   BELOW ARE SYMPTOMS THAT SHOULD BE REPORTED IMMEDIATELY:  *FEVER GREATER THAN 100.5 F  *CHILLS WITH OR WITHOUT FEVER  NAUSEA AND VOMITING THAT IS NOT CONTROLLED WITH YOUR NAUSEA MEDICATION  *UNUSUAL SHORTNESS OF BREATH  *UNUSUAL BRUISING OR BLEEDING  TENDERNESS IN MOUTH AND THROAT WITH OR WITHOUT PRESENCE OF ULCERS  *URINARY PROBLEMS  *BOWEL PROBLEMS  UNUSUAL RASH Items with * indicate a potential emergency and should be followed up as soon as possible.  Feel free to call the clinic you have any questions or concerns. The clinic phone number is [phone].  Please show the CHEMO ALERT CARD at check-in to the Emergency Department and triage nurse.   

## 2015-09-12 NOTE — Telephone Encounter (Signed)
appointments made per 2/7/ pof and avs printed

## 2015-09-12 NOTE — Progress Notes (Signed)
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 She brought it to medical attention when she presented for the free Pap smear screening clinic here 06/01/2015. Exam on that day showed a lump in the right breast at the 4:00 position under the areola. There was also a scar on the right outer breast. This scar was noted in 01/04/2008 when she had her baseline mammogram at [facility]. It had been stable for a year at that time and the patient had treated it with a "blood root paste" which caused scarring. 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 It is in a separate location from the new mass.  On 06/05/2015 Phyllis Wiggins underwent bilateral diagnostic mammography at [facility], with bilateral ultrasonography. This found the breast density to be category B. There was a 2.5 cm oval mass in the right breast at the 5:00 position which by ultrasound measured 2.1 cm. In the left breast there was an area of focal asymmetry which by ultrasound was consistent with fibroglandular tissue/benign.  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  Her subsequent history is as detailed below.  INTERVAL HISTORY: Phyllis Wiggins returns for follow up of her breast cancer, alone. Today is day 1, cycle 4 of carboplatin, docetaxel, trastuzumab, and pertuzumab, with neulasta onpro given on day 2 for granulocyte support.  REVIEW OF SYSTEMS: Phyllis Wiggins had a rough off week. She had low grade temperatures of 98-99.3 over the weekend with some chills. Tylenol helped to break this. She has sinus headaches and a post nasal drip. All of her mucus is clear. She does not want to take anything OTC for this besides tylenol. She denies any UTI symptoms. She has no cough. She had extended ankle swelling that only went down after several days of rest. She is fatigued. Her liquid intake was down for a few days and she knew she was dehydrated. This is improving. She is constantly nauseous but prefers not take her antiemetics after the week of treatment. Her appetite is down secondary to taste changes, but she has gained back 4lb of the weight she lost. She uses imodium regularly for her loose stools. She denies mouth sores due to her use of L-lysine according to her. The rash to her face, chest, and abdomen is minimal. She is not using doxycycline. A detailed review of systems is otherwise stable.   PAST MEDICAL HISTORY: Past Medical History  Diagnosis Date  . Breast cancer of lower-outer quadrant of right female breast ([place]) 06/20/2015  . Breast cancer ([place])   . Arthritis   . Family history of stomach cancer     PAST SURGICAL HISTORY: Past Surgical History  Procedure Laterality Date  .  Wisdom tooth extraction    . Portacath placement Left 07/06/2015    Procedure: INSERTION PORT-A-CATH;  Surgeon: Phyllis Messing III, MD;  Location[facility] OR;  Service: General;  Laterality: Left;    FAMILY HISTORY Family History  Problem Relation Age of Onset  . Diabetes Mother   . Hypertension Mother    . Hyperlipidemia Mother   . Stomach cancer Maternal Grandfather 19  . Diabetes Paternal Grandmother   . Dementia Maternal Uncle   . Congestive Heart Failure Maternal Grandmother    the patient's parents are both living, in their early 18s. The patient had no brother, one sister. The patient's mother works by [employer] administrator [employer] for private clients. The patient's sister is a Pharmacist, hospital in [place] there is no history of breast or ovarian cancer in the family. The patient's paternal grandfather died at the age of 87 from stomach cancer felt to be related to iron unusual toxic exposure   GYNECOLOGIC HISTORY:  No LMP recorded.  menarche age 63, she is [place] P0  she is still having periods but they're quite sporadic. They may occur 6 weeks apart or 3 weeks apart, may be heavy or only spotting. 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 She is divorced and at home lives with 6 dogs.     ADVANCED DIRECTIVES:  not in place    HEALTH MAINTENANCE: Social History  Substance Use Topics  . Smoking status: Former Smoker    Types: Cigarettes    Quit date: 07/04/2010  . Smokeless tobacco: Never Used  . Alcohol Use: No     Colonoscopy:  PAP: October 2016   Bone density:  Lipid panel:  Allergies  Allergen Reactions  . Wiggins   . Dairy Aid [Lactase]     "sinus infections"  . Whey     Sinus issues  . 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 acetaminophen (TYLENOL) 500 MG tablet Take 500 mg by mouth every 6 (six) hours as needed for mild pain. Reported on 08/29/2015    . dexamethasone (DECADRON) 4 MG tablet Take 2 tablets (8 mg total) by mouth 2 (two) times daily. Start the day before Taxotere. Then again the day after chemo for 3 days. 30 tablet 1    . doxycycline (VIBRA-TABS) 100 MG tablet Take 1 tablet (100 mg total) by mouth 2 (two) times daily. (Patient not taking: Reported on 07/28/2015) 30 tablet 1  . ibuprofen (ADVIL,MOTRIN) 200 MG tablet Take 200 mg by mouth every 6 (six) hours as needed for mild pain. Reported on 08/29/2015    . lidocaine-prilocaine (EMLA) cream Apply to affected area once 30 g 3  . LORazepam (ATIVAN) 0.5 MG tablet Take 1 tablet (0.5 mg total) by mouth at bedtime. (Patient not taking: Reported on 07/28/2015) 30 tablet 0  . ondansetron (ZOFRAN) 8 MG tablet Take 1 tablet (8 mg total) by mouth 2 (two) times daily. Start the day after chemo for 3 days. Then take as needed for nausea or vomiting. (Patient not taking: Reported on 08/29/2015) 30 tablet 1  . oxyCODONE-acetaminophen (ROXICET) 5-325 MG tablet Take 1-2 tablets by mouth every 4 (four) hours as needed. (Patient not taking: Reported on 07/28/2015) 50 tablet 0  . prochlorperazine (COMPAZINE) 10 MG tablet  Take 1 tablet (10 mg total) by mouth every 6 (six) hours as needed (Nausea or vomiting). 30 tablet 1   No current facility-administered medications for this visit.    OBJECTIVE: Middle-aged white woman in no acute distress  Filed Vitals:   09/12/15 0900  BP: 125/68  Pulse: 108  Temp: 98.1 F (36.7 C)  Resp: 18     Body mass index is 34.79 kg/(m^2).    ECOG FS:0 - Asymptomatic   Skin: warm, dry erythematous papules to chest, back, and face improving HEENT: sclerae anicteric, conjunctivae pink, oropharynx clear. No thrush or mucositis.  Lymph Nodes: No cervical or supraclavicular lymphadenopathy  Lungs: clear to auscultation bilaterally, no rales, wheezes, or rhonci  Heart: regular rate and rhythm  Abdomen: obese, soft, non tender, positive bowel sounds  Musculoskeletal: No focal spinal tenderness, minimal ankle edema Neuro: non focal, well oriented, positive affect  Breasts: deferred  LAB RESULTS:  CMP     Component Value Date/Time   NA 139  09/12/2015 0842   K 4.0 09/12/2015 0842   CO2 21* 09/12/2015 0842   GLUCOSE 142* 09/12/2015 0842   BUN 10.6 09/12/2015 0842   CREATININE 0.8 09/12/2015 0842   CALCIUM 9.6 09/12/2015 0842   PROT 6.7 09/12/2015 0842   ALBUMIN 3.4* 09/12/2015 0842   AST 16 09/12/2015 0842   ALT 16 09/12/2015 0842   ALKPHOS 52 09/12/2015 0842   BILITOT 0.45 09/12/2015 0842    INo results found for: SPEP, UPEP  Lab Results  Component Value Date   WBC 7.3 09/12/2015   NEUTROABS 6.3 09/12/2015   HGB 11.9 09/12/2015   HCT 34.8 09/12/2015   MCV 92.8 09/12/2015   PLT 205 09/12/2015      Chemistry      Component Value Date/Time   NA 139 09/12/2015 0842   K 4.0 09/12/2015 0842   CO2 21* 09/12/2015 0842   BUN 10.6 09/12/2015 0842   CREATININE 0.8 09/12/2015 0842      Component Value Date/Time   CALCIUM 9.6 09/12/2015 0842   ALKPHOS 52 09/12/2015 0842   AST 16 09/12/2015 0842   ALT 16 09/12/2015 0842   BILITOT 0.45 09/12/2015 0842       No results found for: LABCA2  No components found for: LABCA125  No results for input(s): INR in the last 168 hours.  Urinalysis No results found for: COLORURINE, APPEARANCEUR, LABSPEC, PHURINE, GLUCOSEU, HGBUR, BILIRUBINUR, KETONESUR, PROTEINUR, UROBILINOGEN, NITRITE, LEUKOCYTESUR  STUDIES: No results found.  ASSESSMENT: 45 y.o. [place] woman status post left breast biopsy 06/15/2015 for a clinical T1 cN0, stage IA invasive ductal carcinoma, grade 3, estrogen and progesterone receptor negative, HER-2 amplified, with an MIB-1 of 40%  (1) neoadjuvant chemotherapy to consist of carboplatin, docetaxel, pertuzumab, and trastuzumab with onpro support every 3 weeks 6  (2) breast conserving surgery with sentinel lymph node sampling to follow  (3) adjuvant radiation to follow surgery  (4) trastuzumab to be continued to complete a year  (5) genetics testing pending  PLAN: Kathleen is not at 100% percent today, but she has no acute symptoms to keep her  from being treated today. The labs were reviewed in detail and were stable. She will proceed with cycle 4 of carboplatin, docetaxel, pertuzumab, and trastuzumab.   Her temperature is normal today as well as her WBC. She has no signs or symptoms of a bacterial infection at this time. She knows to alert [place] of any temperatures at least 100.15F, or lower if there are  accompanying symptoms. (She insists that her normal temperature is lower than average, and so she may never reach 100.69F like the usual patient)  I suggested she wear compression stockings daily, especially while standing at work.   She plans to start back zofran for her nausea this week, despite the fact that it causes headaches. She will continue imodium for her diarrhea. She plans to increase her oral intake, and is looking to take in 53m of protein daily.   KR[place]ill return in 1 week for labs and a nadir visit. She understands and agrees with this plan. She knows the goal of treatment in her case is cure. She has been encouraged to call with any issues that might arise before her next visit here.    HLaurie Panda NP   09/12/2015 9:28 AM

## 2015-09-13 ENCOUNTER — Encounter: Payer: Self-pay | Admitting: Nurse Practitioner

## 2015-09-13 NOTE — Progress Notes (Signed)
Returned phone call to patient with a request to see me on yesterday. I called patient to inform that I was out of the office on yesterday and she said Dawn did come to inform her. Patient wanted to set up time to come and bring applications and documentation to submit. Patient will see me on Tuesday 2/14 after her appointment with Nira Conn and bring paperwork with her.

## 2015-09-14 ENCOUNTER — Other Ambulatory Visit: Payer: Self-pay | Admitting: Nurse Practitioner

## 2015-09-19 ENCOUNTER — Ambulatory Visit (HOSPITAL_BASED_OUTPATIENT_CLINIC_OR_DEPARTMENT_OTHER): Payer: Medicaid Other | Admitting: Nurse Practitioner

## 2015-09-19 ENCOUNTER — Other Ambulatory Visit (HOSPITAL_BASED_OUTPATIENT_CLINIC_OR_DEPARTMENT_OTHER): Payer: Medicaid Other

## 2015-09-19 ENCOUNTER — Encounter: Payer: Self-pay | Admitting: Nurse Practitioner

## 2015-09-19 VITALS — BP 102/65 | HR 107 | Temp 98.9°F | Resp 19 | Wt 209.4 lb

## 2015-09-19 DIAGNOSIS — C50919 Malignant neoplasm of unspecified site of unspecified female breast: Secondary | ICD-10-CM

## 2015-09-19 DIAGNOSIS — C50912 Malignant neoplasm of unspecified site of left female breast: Secondary | ICD-10-CM

## 2015-09-19 DIAGNOSIS — R197 Diarrhea, unspecified: Secondary | ICD-10-CM

## 2015-09-19 DIAGNOSIS — C50511 Malignant neoplasm of lower-outer quadrant of right female breast: Secondary | ICD-10-CM

## 2015-09-19 DIAGNOSIS — T451X5A Adverse effect of antineoplastic and immunosuppressive drugs, initial encounter: Secondary | ICD-10-CM

## 2015-09-19 DIAGNOSIS — R112 Nausea with vomiting, unspecified: Secondary | ICD-10-CM

## 2015-09-19 DIAGNOSIS — E860 Dehydration: Secondary | ICD-10-CM

## 2015-09-19 LAB — COMPREHENSIVE METABOLIC PANEL
ALBUMIN: 3.7 g/dL (ref 3.5–5.0)
ALK PHOS: 75 U/L (ref 40–150)
ALT: 19 U/L (ref 0–55)
ANION GAP: 12 meq/L — AB (ref 3–11)
AST: 15 U/L (ref 5–34)
BILIRUBIN TOTAL: 0.42 mg/dL (ref 0.20–1.20)
BUN: 11.2 mg/dL (ref 7.0–26.0)
CO2: 26 mEq/L (ref 22–29)
Calcium: 9.6 mg/dL (ref 8.4–10.4)
Chloride: 99 mEq/L (ref 98–109)
Creatinine: 0.9 mg/dL (ref 0.6–1.1)
EGFR: 74 mL/min/{1.73_m2} — AB (ref >90)
GLUCOSE: 140 mg/dL (ref 70–140)
POTASSIUM: 3.7 meq/L (ref 3.5–5.1)
SODIUM: 137 meq/L (ref 136–145)
TOTAL PROTEIN: 6.9 g/dL (ref 6.4–8.3)

## 2015-09-19 LAB — CBC WITH DIFFERENTIAL/PLATELET
BASO%: 0.3 % (ref 0.0–2.0)
BASOS ABS: 0 10*3/uL (ref 0.0–0.1)
EOS ABS: 0 10*3/uL (ref 0.0–0.5)
EOS%: 0.2 % (ref 0.0–7.0)
HCT: 35.8 % (ref 34.8–46.6)
HGB: 12.1 g/dL (ref 11.6–15.9)
LYMPH%: 26.2 % (ref 14.0–49.7)
MCH: 31.4 pg (ref 25.1–34.0)
MCHC: 33.8 g/dL (ref 31.5–36.0)
MCV: 93 fL (ref 79.5–101.0)
MONO#: 1.3 10*3/uL — AB (ref 0.1–0.9)
MONO%: 11.5 % (ref 0.0–14.0)
NEUT#: 7.2 10*3/uL — ABNORMAL HIGH (ref 1.5–6.5)
NEUT%: 61.8 % (ref 38.4–76.8)
PLATELETS: 250 10*3/uL (ref 145–400)
RBC: 3.85 10*6/uL (ref 3.70–5.45)
RDW: 16.9 % — ABNORMAL HIGH (ref 11.2–14.5)
WBC: 11.6 10*3/uL — ABNORMAL HIGH (ref 3.9–10.3)
lymph#: 3 10*3/uL (ref 0.9–3.3)

## 2015-09-19 MED ORDER — HEPARIN SOD (PORK) LOCK FLUSH 100 UNIT/ML IV SOLN
500.0000 [IU] | Freq: Once | INTRAVENOUS | Status: DC | PRN
  Filled 2015-09-19: qty 5

## 2015-09-19 MED ORDER — HEPARIN SOD (PORK) LOCK FLUSH 100 UNIT/ML IV SOLN
250.0000 [IU] | Freq: Once | INTRAVENOUS | Status: DC | PRN
  Filled 2015-09-19: qty 5

## 2015-09-19 MED ORDER — PROCHLORPERAZINE EDISYLATE 5 MG/ML IJ SOLN
INTRAMUSCULAR | Status: AC
  Filled 2015-09-19: qty 2

## 2015-09-19 MED ORDER — ALTEPLASE 2 MG IJ SOLR
2.0000 mg | Freq: Once | INTRAMUSCULAR | Status: DC | PRN
  Filled 2015-09-19: qty 2

## 2015-09-19 MED ORDER — SODIUM CHLORIDE 0.9 % IJ SOLN
10.0000 mL | INTRAMUSCULAR | Status: DC | PRN
  Filled 2015-09-19: qty 10

## 2015-09-19 MED ORDER — PROCHLORPERAZINE EDISYLATE 5 MG/ML IJ SOLN
10.0000 mg | Freq: Four times a day (QID) | INTRAMUSCULAR | Status: DC | PRN
  Administered 2015-09-19: 10 mg via INTRAVENOUS

## 2015-09-19 MED ORDER — SODIUM CHLORIDE 0.9 % IV SOLN
1000.0000 mL | Freq: Once | INTRAVENOUS | Status: AC
  Administered 2015-09-19: 1000 mL via INTRAVENOUS

## 2015-09-19 NOTE — Progress Notes (Signed)
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 She brought it to medical attention when she presented for the free Pap smear screening clinic here 06/01/2015. Exam on that day showed a lump in the right breast at the 4:00 position under the areola. There was also a scar on the right outer breast. This scar was noted in 01/04/2008 when she had her baseline mammogram at [facility]. It had been stable for a year at that time and the patient had treated it with a "blood root paste" which caused scarring. 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 It is in a separate location from the new mass.  On 06/05/2015 Phyllis Wiggins underwent bilateral diagnostic mammography at [facility], with bilateral ultrasonography. This found the breast density to be category B. There was a 2.5 cm oval mass in the right breast at the 5:00 position which by ultrasound measured 2.1 cm. In the left breast there was an area of focal asymmetry which by ultrasound was consistent with fibroglandular tissue/benign.  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  Her subsequent history is as detailed below.  INTERVAL HISTORY: Phyllis Wiggins returns for follow up of her breast cancer, accompanied by her mother. Today is day 8, cycle 4 of carboplatin, docetaxel, trastuzumab, and pertuzumab, with neulasta onpro given on day 2 for granulocyte support.   REVIEW OF SYSTEMS:  Phyllis Wiggins has had her worst week so far. Starting day 5 of this past treatment she has ranged from diarrhea to constipation after imodium back to diarrhea after stool softeners. She has vomited at least daily since Friday, multiple time yesterday and Sunday. She has not been taking her oral antiemetics. She thought these needed to be taken with food, and she could not keep anything down as it was. She is not having anything but 2 protein shakes per day. She has lost over 10lb in the past week. She is not drinking fluids. She has been having headaches on the zofran. She is having a post nasal drip that also contributes to the nausea. She is not sleeping well. She is excessively fatigued, but denies weakness. A detailed review of systems is otherwise stable.  PAST MEDICAL HISTORY: Past Medical History  Diagnosis Date  . Breast cancer of lower-outer quadrant of right female breast ([place]) 06/20/2015  . Breast cancer ([place])   . Arthritis   . Family history of stomach cancer     PAST SURGICAL HISTORY: Past Surgical History  Procedure Laterality Date  . Wisdom tooth extraction    . Portacath placement Left 07/06/2015    Procedure: INSERTION PORT-A-CATH;  Surgeon: Autumn Messing III, MD;  Location: [place];  Service: General;  Laterality: Left;    FAMILY HISTORY Family History  Problem Relation Age of Onset  . Diabetes Mother   . Hypertension Mother   . Hyperlipidemia Mother   . Stomach cancer Maternal Grandfather 39  . Diabetes Paternal Grandmother   . Dementia Maternal Uncle   . Congestive Heart Failure Maternal  Grandmother    the patient's parents are both living, in their early 69s. The patient had no brother, one sister. The patient's mother works by [employer] administrator [employer] for private clients. The patient's sister is a Pharmacist, hospital in [place] there is no history of breast or ovarian cancer in the family. The patient's paternal grandfather died at the age of 35 from stomach cancer felt to be related to iron unusual toxic exposure   GYNECOLOGIC HISTORY:  No LMP recorded.  menarche age 62, she is [place] P0  she is still having periods but they're quite sporadic. They may occur 6 weeks apart or 3 weeks apart, may be heavy or only spotting. 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 She is divorced and at home lives with 6 dogs.     ADVANCED DIRECTIVES:  not in place    HEALTH MAINTENANCE: Social History  Substance Use Topics  . Smoking status: Former Smoker    Types: Cigarettes    Quit date: 07/04/2010  . Smokeless tobacco: Never Used  . Alcohol Use: No     Colonoscopy:  PAP: October 2016   Bone density:  Lipid panel:  Allergies  Allergen Reactions  . Cheese   . Dairy Aid [Lactase]     "sinus infections"  . Whey     Sinus issues  . 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 dexamethasone (DECADRON) 4 MG tablet Take 2 tablets (8 mg total) by mouth 2 (two) times daily. Start the day before Taxotere. Then again the day after chemo for 3 days. 30 tablet 0  . lidocaine-prilocaine (EMLA) cream Apply to affected area once 30 g 3  . acetaminophen (TYLENOL) 500 MG tablet Take 500 mg by mouth every 6 (six) hours as needed for mild pain. Reported on 09/19/2015    . doxycycline (VIBRA-TABS) 100 MG tablet Take 1 tablet (100 mg total) by mouth 2 (two) times  daily. (Patient not taking: Reported on 07/28/2015) 30 tablet 1  . ibuprofen (ADVIL,MOTRIN) 200 MG tablet Take 200 mg by mouth every 6 (six) hours as needed for mild pain. Reported on 09/19/2015    . LORazepam (ATIVAN) 0.5 MG tablet Take 1 tablet (0.5 mg total) by mouth at bedtime. (Patient not taking: Reported on 07/28/2015) 30 tablet 0  . ondansetron (ZOFRAN) 8 MG tablet Take 1 tablet (8 mg total) by mouth 2 (two) times daily. Start the day after chemo for 3 days. Then take as needed for nausea or vomiting. (Patient not taking: Reported on 08/29/2015) 30 tablet 1  . oxyCODONE-acetaminophen (ROXICET) 5-325 MG tablet Take 1-2 tablets by mouth every 4 (four) hours as needed. (Patient not taking: Reported on 07/28/2015) 50 tablet 0  . prochlorperazine (COMPAZINE) 10 MG tablet Take 1 tablet (10 mg total) by mouth every 6 (six) hours as needed (Nausea or vomiting). (Patient not taking: Reported on 09/19/2015) 30 tablet 1   No current facility-administered medications  for this visit.    OBJECTIVE: Middle-aged white woman in no acute distress  Filed Vitals:   09/19/15 0843  BP: 102/65  Pulse: 107  Temp: 98.9 F (37.2 C)  Resp: 19     Body mass index is 32.79 kg/(m^2).    ECOG FS:0 - Asymptomatic   Skin: warm, dry erythematous papules to chest, back, and face, unchanged Sclerae unicteric, pupils round and equal Oropharynx clear and moist-- no thrush or other lesions No cervical or supraclavicular adenopathy Lungs no rales or rhonchi Heart regular rate and rhythm Abd soft, nontender, positive bowel sounds MSK no focal spinal tenderness, no upper extremity lymphedema Neuro: nonfocal, well oriented, sickly affect Breasts: deferred  LAB RESULTS:  CMP     Component Value Date/Time   NA 137 09/19/2015 0830   K 3.7 09/19/2015 0830   CO2 26 09/19/2015 0830   GLUCOSE 140 09/19/2015 0830   BUN 11.2 09/19/2015 0830   CREATININE 0.9 09/19/2015 0830   CALCIUM 9.6 09/19/2015 0830   PROT 6.9  09/19/2015 0830   ALBUMIN 3.7 09/19/2015 0830   AST 15 09/19/2015 0830   ALT 19 09/19/2015 0830   ALKPHOS 75 09/19/2015 0830   BILITOT 0.42 09/19/2015 0830    INo results found for: SPEP, UPEP  Lab Results  Component Value Date   WBC 11.6* 09/19/2015   NEUTROABS 7.2* 09/19/2015   HGB 12.1 09/19/2015   HCT 35.8 09/19/2015   MCV 93.0 09/19/2015   PLT 250 09/19/2015      Chemistry      Component Value Date/Time   NA 137 09/19/2015 0830   K 3.7 09/19/2015 0830   CO2 26 09/19/2015 0830   BUN 11.2 09/19/2015 0830   CREATININE 0.9 09/19/2015 0830      Component Value Date/Time   CALCIUM 9.6 09/19/2015 0830   ALKPHOS 75 09/19/2015 0830   AST 15 09/19/2015 0830   ALT 19 09/19/2015 0830   BILITOT 0.42 09/19/2015 0830       No results found for: LABCA2  No components found for: LABCA125  No results for input(s): INR in the last 168 hours.  Urinalysis No results found for: COLORURINE, APPEARANCEUR, LABSPEC, PHURINE, GLUCOSEU, HGBUR, BILIRUBINUR, KETONESUR, PROTEINUR, UROBILINOGEN, NITRITE, LEUKOCYTESUR  STUDIES: No results found.  ASSESSMENT: 45 y.o. [place] woman status post left breast biopsy 06/15/2015 for a clinical T1 cN0, stage IA invasive ductal carcinoma, grade 3, estrogen and progesterone receptor negative, HER-2 amplified, with an MIB-1 of 40%  (1) neoadjuvant chemotherapy to consist of carboplatin, docetaxel, pertuzumab, and trastuzumab with onpro support every 3 weeks 6  (2) breast conserving surgery with sentinel lymph node sampling to follow  (3) adjuvant radiation to follow surgery  (4) trastuzumab to be continued to complete a year  (5) genetics testing pending  PLAN: Kenniya managed her symptoms poorly this weekend. She understands now that it is appropriate to call when becomes this dehydrated and has inretractible nausea. She is going to stay with [place] for IV fluids and IV compazine. When she gets home she will get back on her regimen of oral  antiemetics. She declined a prescription for phenergan suppositories. She did not realize she could take her nausea medicines without food on her stomach, which was half of the struggle. She has imodium at home and knows how to use this. She was reminded to "pull back" on this drug when she notices her stools firming up.   Lisanne will return in 2 weeks for cycle 5 of treatment.  She understands and agrees with this plan. She knows the goal of treatment in her case is cure. She has been encouraged to call with any issues that might arise before her next visit here.    Laurie Panda, NP   09/19/2015 9:24 AM

## 2015-09-22 ENCOUNTER — Other Ambulatory Visit: Payer: Self-pay | Admitting: Nurse Practitioner

## 2015-10-02 ENCOUNTER — Other Ambulatory Visit: Payer: Self-pay | Admitting: Nurse Practitioner

## 2015-10-03 ENCOUNTER — Ambulatory Visit (HOSPITAL_BASED_OUTPATIENT_CLINIC_OR_DEPARTMENT_OTHER): Payer: Medicaid Other

## 2015-10-03 ENCOUNTER — Encounter: Payer: Self-pay | Admitting: Nurse Practitioner

## 2015-10-03 ENCOUNTER — Ambulatory Visit (HOSPITAL_BASED_OUTPATIENT_CLINIC_OR_DEPARTMENT_OTHER): Payer: Medicaid Other | Admitting: Nurse Practitioner

## 2015-10-03 ENCOUNTER — Other Ambulatory Visit: Payer: Self-pay | Admitting: Oncology

## 2015-10-03 ENCOUNTER — Other Ambulatory Visit (HOSPITAL_BASED_OUTPATIENT_CLINIC_OR_DEPARTMENT_OTHER): Payer: Medicaid Other

## 2015-10-03 ENCOUNTER — Encounter: Payer: Self-pay | Admitting: *Deleted

## 2015-10-03 VITALS — BP 116/65 | HR 73 | Temp 98.0°F | Resp 18 | Wt 215.6 lb

## 2015-10-03 DIAGNOSIS — D6481 Anemia due to antineoplastic chemotherapy: Secondary | ICD-10-CM

## 2015-10-03 DIAGNOSIS — R112 Nausea with vomiting, unspecified: Secondary | ICD-10-CM

## 2015-10-03 DIAGNOSIS — C50511 Malignant neoplasm of lower-outer quadrant of right female breast: Secondary | ICD-10-CM

## 2015-10-03 DIAGNOSIS — J329 Chronic sinusitis, unspecified: Secondary | ICD-10-CM | POA: Diagnosis not present

## 2015-10-03 DIAGNOSIS — G622 Polyneuropathy due to other toxic agents: Secondary | ICD-10-CM | POA: Diagnosis not present

## 2015-10-03 DIAGNOSIS — T451X5A Adverse effect of antineoplastic and immunosuppressive drugs, initial encounter: Secondary | ICD-10-CM

## 2015-10-03 DIAGNOSIS — J069 Acute upper respiratory infection, unspecified: Secondary | ICD-10-CM

## 2015-10-03 DIAGNOSIS — G620 Drug-induced polyneuropathy: Secondary | ICD-10-CM | POA: Insufficient documentation

## 2015-10-03 DIAGNOSIS — Z5112 Encounter for antineoplastic immunotherapy: Secondary | ICD-10-CM

## 2015-10-03 DIAGNOSIS — Z5111 Encounter for antineoplastic chemotherapy: Secondary | ICD-10-CM

## 2015-10-03 DIAGNOSIS — D696 Thrombocytopenia, unspecified: Secondary | ICD-10-CM

## 2015-10-03 DIAGNOSIS — C50919 Malignant neoplasm of unspecified site of unspecified female breast: Secondary | ICD-10-CM

## 2015-10-03 DIAGNOSIS — R0982 Postnasal drip: Secondary | ICD-10-CM

## 2015-10-03 LAB — CBC WITH DIFFERENTIAL/PLATELET
BASO%: 0.1 % (ref 0.0–2.0)
BASOS ABS: 0 10*3/uL (ref 0.0–0.1)
EOS ABS: 0 10*3/uL (ref 0.0–0.5)
EOS%: 0 % (ref 0.0–7.0)
HCT: 31.5 % — ABNORMAL LOW (ref 34.8–46.6)
HEMOGLOBIN: 10.4 g/dL — AB (ref 11.6–15.9)
LYMPH#: 0.9 10*3/uL (ref 0.9–3.3)
LYMPH%: 21.3 % (ref 14.0–49.7)
MCH: 32.3 pg (ref 25.1–34.0)
MCHC: 33.2 g/dL (ref 31.5–36.0)
MCV: 97.3 fL (ref 79.5–101.0)
MONO#: 0.1 10*3/uL (ref 0.1–0.9)
MONO%: 3.1 % (ref 0.0–14.0)
NEUT%: 75.5 % (ref 38.4–76.8)
NEUTROS ABS: 3.2 10*3/uL (ref 1.5–6.5)
Platelets: 81 10*3/uL — ABNORMAL LOW (ref 145–400)
RBC: 3.23 10*6/uL — ABNORMAL LOW (ref 3.70–5.45)
RDW: 17.7 % — ABNORMAL HIGH (ref 11.2–14.5)
WBC: 4.2 10*3/uL (ref 3.9–10.3)

## 2015-10-03 LAB — COMPREHENSIVE METABOLIC PANEL
ALBUMIN: 3.7 g/dL (ref 3.5–5.0)
ALT: 13 U/L (ref 0–55)
AST: 10 U/L (ref 5–34)
Alkaline Phosphatase: 44 U/L (ref 40–150)
Anion Gap: 11 mEq/L (ref 3–11)
BUN: 8.6 mg/dL (ref 7.0–26.0)
CHLORIDE: 105 meq/L (ref 98–109)
CO2: 22 meq/L (ref 22–29)
Calcium: 9.3 mg/dL (ref 8.4–10.4)
Creatinine: 0.8 mg/dL (ref 0.6–1.1)
EGFR: 86 mL/min/{1.73_m2} — ABNORMAL LOW (ref >90)
GLUCOSE: 177 mg/dL — AB (ref 70–140)
Potassium: 3.9 mEq/L (ref 3.5–5.1)
SODIUM: 139 meq/L (ref 136–145)
TOTAL PROTEIN: 6.3 g/dL — AB (ref 6.4–8.3)
Total Bilirubin: 0.56 mg/dL (ref 0.20–1.20)

## 2015-10-03 MED ORDER — ACETAMINOPHEN 325 MG PO TABS
650.0000 mg | ORAL_TABLET | Freq: Once | ORAL | Status: AC
  Administered 2015-10-03: 650 mg via ORAL

## 2015-10-03 MED ORDER — SODIUM CHLORIDE 0.9 % IV SOLN
420.0000 mg | Freq: Once | INTRAVENOUS | Status: AC
  Administered 2015-10-03: 420 mg via INTRAVENOUS
  Filled 2015-10-03: qty 14

## 2015-10-03 MED ORDER — DIPHENHYDRAMINE HCL 25 MG PO CAPS
25.0000 mg | ORAL_CAPSULE | Freq: Once | ORAL | Status: AC
Start: 2015-10-03 — End: 2015-10-03
  Administered 2015-10-03: 25 mg via ORAL

## 2015-10-03 MED ORDER — DIPHENHYDRAMINE HCL 25 MG PO CAPS
ORAL_CAPSULE | ORAL | Status: AC
  Filled 2015-10-03: qty 1

## 2015-10-03 MED ORDER — TRASTUZUMAB CHEMO INJECTION 440 MG
6.0000 mg/kg | Freq: Once | INTRAVENOUS | Status: AC
  Administered 2015-10-03: 609 mg via INTRAVENOUS
  Filled 2015-10-03: qty 29

## 2015-10-03 MED ORDER — DEXTROSE 5 % IV SOLN
56.2500 mg/m2 | Freq: Once | INTRAVENOUS | Status: AC
  Administered 2015-10-03: 120 mg via INTRAVENOUS
  Filled 2015-10-03: qty 12

## 2015-10-03 MED ORDER — ACETAMINOPHEN 325 MG PO TABS
ORAL_TABLET | ORAL | Status: AC
  Filled 2015-10-03: qty 2

## 2015-10-03 MED ORDER — SODIUM CHLORIDE 0.9 % IJ SOLN
10.0000 mL | INTRAMUSCULAR | Status: DC | PRN
  Administered 2015-10-03: 10 mL
  Filled 2015-10-03: qty 10

## 2015-10-03 MED ORDER — SODIUM CHLORIDE 0.9 % IV SOLN
Freq: Once | INTRAVENOUS | Status: AC
  Administered 2015-10-03: 10:00:00 via INTRAVENOUS

## 2015-10-03 MED ORDER — SODIUM CHLORIDE 0.9 % IV SOLN
Freq: Once | INTRAVENOUS | Status: AC
  Administered 2015-10-03: 13:00:00 via INTRAVENOUS
  Filled 2015-10-03: qty 1

## 2015-10-03 MED ORDER — HEPARIN SOD (PORK) LOCK FLUSH 100 UNIT/ML IV SOLN
500.0000 [IU] | Freq: Once | INTRAVENOUS | Status: AC | PRN
  Administered 2015-10-03: 500 [IU]
  Filled 2015-10-03: qty 5

## 2015-10-03 MED ORDER — PALONOSETRON HCL INJECTION 0.25 MG/5ML
0.2500 mg | Freq: Once | INTRAVENOUS | Status: AC
  Administered 2015-10-03: 0.25 mg via INTRAVENOUS

## 2015-10-03 MED ORDER — SODIUM CHLORIDE 0.9 % IV SOLN
562.5000 mg | Freq: Once | INTRAVENOUS | Status: AC
  Administered 2015-10-03: 560 mg via INTRAVENOUS
  Filled 2015-10-03: qty 56

## 2015-10-03 MED ORDER — PEGFILGRASTIM 6 MG/0.6ML ~~LOC~~ PSKT
6.0000 mg | PREFILLED_SYRINGE | Freq: Once | SUBCUTANEOUS | Status: AC
  Administered 2015-10-03: 6 mg via SUBCUTANEOUS
  Filled 2015-10-03: qty 0.6

## 2015-10-03 MED ORDER — PALONOSETRON HCL INJECTION 0.25 MG/5ML
INTRAVENOUS | Status: AC
  Filled 2015-10-03: qty 5

## 2015-10-03 MED FILL — ONDANSETRON HCL 8 MG TABLET: 8 | 15 days supply | Qty: 30 | Fill #1

## 2015-10-03 NOTE — Progress Notes (Signed)
OK to treat with today's PLT value per Heather's office note from today.

## 2015-10-03 NOTE — Progress Notes (Signed)
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 She brought it to medical attention when she presented for the free Pap smear screening clinic here 06/01/2015. Exam on that day showed a lump in the right breast at the 4:00 position under the areola. There was also a scar on the right outer breast. This scar was noted in 01/04/2008 when she had her baseline mammogram at [facility]. It had been stable for a year at that time and the patient had treated it with a "blood root paste" which caused scarring. 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 It is in a separate location from the new mass.  On 06/05/2015 Phyllis Wiggins underwent bilateral diagnostic mammography at [facility], with bilateral ultrasonography. This found the breast density to be category B. There was a 2.5 cm oval mass in the right breast at the 5:00 position which by ultrasound measured 2.1 cm. In the left breast there was an area of focal asymmetry which by ultrasound was consistent with fibroglandular tissue/benign.  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  Her subsequent history is as detailed below.  INTERVAL HISTORY: Phyllis Wiggins returns for follow up of her breast cancer, accompanied by her sister. Today is day 1, cycle 5 of carboplatin, docetaxel, trastuzumab, and pertuzumab, with neulasta onpro given on day 2 for granulocyte support.   REVIEW OF SYSTEMS:  Phyllis Wiggins improved tremendously after the liter of IV fluids was given 2 weeks ago. She got back on track with her nausea meds and this issue has resolved now. Her stools are still loose, but imodium once or twice a day manages this well. Her appetite is back up and she is drinking more. She has gained back 6lb since her last visit. She denies mouth sores or rashes. She forgot to mention a change in sensation to her fingertips and numbness to the bottom of her bilateral feet that started 2 weeks ago. This lasted about 2 days then resolved. She is battling a post nasal drip and sinus headaches. She is using tylenol cold and sinus for this PRN. She denies fever, chills, or congestion. Her energy level is fair. A detailed review of systems is otherwise stable.  PAST MEDICAL HISTORY: Past Medical History  Diagnosis Date  . Breast cancer of lower-outer quadrant of right female breast ([place]) 06/20/2015  . Breast cancer ([place])   . Arthritis   . Family history of stomach cancer     PAST SURGICAL HISTORY: Past Surgical History  Procedure Laterality Date  . Wisdom tooth extraction    . Portacath placement Left 07/06/2015    Procedure: INSERTION PORT-A-CATH;  Surgeon: Autumn Messing III, MD;  Location: [facility];  Service: General;  Laterality: Left;    FAMILY HISTORY Family History  Problem Relation Age of Onset  . Diabetes Mother   . Hypertension Mother   . Hyperlipidemia Mother   . Stomach cancer Maternal Grandfather 33  . Diabetes Paternal Grandmother   . Dementia Maternal Uncle   . Congestive Heart Failure  Maternal Grandmother    the patient's parents are both living, in their early 97s. The patient had no brother, one sister. The patient's mother works by [employer] for private clients. The patient's sister is a Pharmacist, hospital in [place] there is no history of breast or ovarian cancer in the family. The patient's paternal grandfather died at the age of 33 from stomach cancer felt to be related to iron unusual toxic exposure   GYNECOLOGIC HISTORY:  No LMP recorded.  menarche age 44, she is [place] P0  she is still having periods but they're quite sporadic. They may occur 6 weeks apart or 3 weeks apart, may be heavy or only spotting. 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 She is divorced and at home lives with 6 dogs.     ADVANCED DIRECTIVES:  not in place    HEALTH MAINTENANCE: Social History  Substance Use Topics  . Smoking status: Former Smoker    Types: Cigarettes    Quit date: 07/04/2010  . Smokeless tobacco: Never Used  . Alcohol Use: No     Colonoscopy:  PAP: October 2016   Bone density:  Lipid panel:  Allergies  Allergen Reactions  . Cheese   . Dairy Aid [Lactase]     "sinus infections"  . Whey     Sinus issues  . 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 acetaminophen (TYLENOL) 500 MG tablet Take 500 mg by mouth every 6 (six) hours as needed for mild pain. Reported on 10/03/2015    . dexamethasone (DECADRON) 4 MG tablet Take 2 tablets (8 mg total) by mouth 2 (two) times daily. Start the day before Taxotere. Then again the day after chemo for 3 days. 30 tablet 0  . lidocaine-prilocaine (EMLA) cream Apply to affected area once 30 g 3  . ondansetron (ZOFRAN) 8 MG tablet Take 1 tablet (8 mg total) by mouth 2 (two) times  daily. Start the day after chemo for 3 days. Then take as needed for nausea or vomiting. 30 tablet 1  . doxycycline (VIBRA-TABS) 100 MG tablet Take 1 tablet (100 mg total) by mouth 2 (two) times daily. (Patient not taking: Reported on 07/28/2015) 30 tablet 1  . ibuprofen (ADVIL,MOTRIN) 200 MG tablet Take 200 mg by mouth every 6 (six) hours as needed for mild pain. Reported on 10/03/2015    . LORazepam (ATIVAN) 0.5 MG tablet Take 1 tablet (0.5 mg total) by mouth at bedtime. (Patient not taking: Reported on 07/28/2015) 30 tablet 0  . oxyCODONE-acetaminophen (ROXICET) 5-325 MG tablet Take 1-2 tablets by mouth every 4 (four) hours as needed. (Patient not taking: Reported on 07/28/2015) 50 tablet 0  . prochlorperazine (COMPAZINE) 10 MG tablet Take 1 tablet (10 mg total) by mouth every 6 (six) hours as needed (Nausea or vomiting). (Patient not taking: Reported on 09/19/2015) 30 tablet 1   No current facility-administered medications for this visit.  OBJECTIVE: Middle-aged white woman in no acute distress  Filed Vitals:   10/03/15 0912  BP: 116/65  Pulse: 73  Temp: 98 F (36.7 C)  Resp: 18     Body mass index is 33.76 kg/(m^2).    ECOG FS:0 - Asymptomatic   Skin: warm, dry  HEENT: sclerae anicteric, conjunctivae pink, oropharynx clear. No thrush or mucositis.  Lymph Nodes: No cervical or supraclavicular lymphadenopathy  Lungs: clear to auscultation bilaterally, no rales, wheezes, or rhonci  Heart: regular rate and rhythm  Abdomen: round, soft, non tender, positive bowel sounds  Musculoskeletal: No focal spinal tenderness, no peripheral edema  Neuro: non focal, well oriented, positive affect  Breasts: deferred  LAB RESULTS:  CMP     Component Value Date/Time   NA 137 09/19/2015 0830   K 3.7 09/19/2015 0830   CO2 26 09/19/2015 0830   GLUCOSE 140 09/19/2015 0830   BUN 11.2 09/19/2015 0830   CREATININE 0.9 09/19/2015 0830   CALCIUM 9.6 09/19/2015 0830   PROT 6.9 09/19/2015 0830    ALBUMIN 3.7 09/19/2015 0830   AST 15 09/19/2015 0830   ALT 19 09/19/2015 0830   ALKPHOS 75 09/19/2015 0830   BILITOT 0.42 09/19/2015 0830    INo results found for: SPEP, UPEP  Lab Results  Component Value Date   WBC 4.2 10/03/2015   NEUTROABS 3.2 10/03/2015   HGB 10.4* 10/03/2015   HCT 31.5* 10/03/2015   MCV 97.3 10/03/2015   PLT 81* 10/03/2015      Chemistry      Component Value Date/Time   NA 137 09/19/2015 0830   K 3.7 09/19/2015 0830   CO2 26 09/19/2015 0830   BUN 11.2 09/19/2015 0830   CREATININE 0.9 09/19/2015 0830      Component Value Date/Time   CALCIUM 9.6 09/19/2015 0830   ALKPHOS 75 09/19/2015 0830   AST 15 09/19/2015 0830   ALT 19 09/19/2015 0830   BILITOT 0.42 09/19/2015 0830       No results found for: LABCA2  No components found for: LABCA125  No results for input(s): INR in the last 168 hours.  Urinalysis No results found for: COLORURINE, APPEARANCEUR, LABSPEC, PHURINE, GLUCOSEU, HGBUR, BILIRUBINUR, KETONESUR, PROTEINUR, UROBILINOGEN, NITRITE, LEUKOCYTESUR  STUDIES: No results found.  ASSESSMENT: 45 y.o. [place] woman status post left breast biopsy 06/15/2015 for a clinical T1 cN0, stage IA invasive ductal carcinoma, grade 3, estrogen and progesterone receptor negative, HER-2 amplified, with an MIB-1 of 40%  (1) neoadjuvant chemotherapy to consist of carboplatin, docetaxel, pertuzumab, and trastuzumab with onpro support every 3 weeks 6  (2) breast conserving surgery with sentinel lymph node sampling to follow  (3) adjuvant radiation to follow surgery  (4) trastuzumab to be continued to complete a year  (5) genetics testing pending  PLAN: Claudett is almost back to her baseline today. The labs were reviewed in detail. Her hgb has dropped by several points to 10.4. Her platelet count is also low at 81. I consulted with Dr. Jana Hakim. Given these recent changes and her poor performance with the last cycle, we are dropping the docetaxel and  carboplatin by 25% each. We are also adding aloxi to her premed list. She understands that this is similar to zofran, so she should not use any of this drug at home until 48 hours from today. We will continue to monitor her peripheral neuropathy symptoms. There are non present on today's exam.   Scotlyn will continue treating her sinus symptoms with OTC meds. She  will alert [place] of any fevers or symptoms of dehydration.   Chakira will return in 1 week for cycle 5 of treatment. She understands and agrees with this plan. She knows the goal of treatment in her case is cure. She has been encouraged to call with any issues that might arise before her next visit here.    Laurie Panda, NP   10/03/2015 9:38 AM

## 2015-10-03 NOTE — Patient Instructions (Signed)
[facility] Discharge Instructions for Patients Receiving Chemotherapy  Today you received the following chemotherapy agents Taxol, Carbolplatin, Herceptin and Perjeta.   To help prevent nausea and vomiting after your treatment, we encourage you to take your nausea medication DO NOT TAKE ZOFRAN FOR 3 DAYS TAKE COMPAZINE INSTEAD UNTIL THAT TIME. ZOFRAN IS THE SAME AS ALOXI.    If you develop nausea and vomiting that is not controlled by your nausea medication, call the clinic.   BELOW ARE SYMPTOMS THAT SHOULD BE REPORTED IMMEDIATELY:  *FEVER GREATER THAN 100.5 F  *CHILLS WITH OR WITHOUT FEVER  NAUSEA AND VOMITING THAT IS NOT CONTROLLED WITH YOUR NAUSEA MEDICATION  *UNUSUAL SHORTNESS OF BREATH  *UNUSUAL BRUISING OR BLEEDING  TENDERNESS IN MOUTH AND THROAT WITH OR WITHOUT PRESENCE OF ULCERS  *URINARY PROBLEMS  *BOWEL PROBLEMS  UNUSUAL RASH Items with * indicate a potential emergency and should be followed up as soon as possible.  Feel free to call the clinic you have any questions or concerns. The clinic phone number is [phone].  Please show the [facility] at check-in to the Emergency Department and triage nurse.

## 2015-10-05 ENCOUNTER — Other Ambulatory Visit (HOSPITAL_COMMUNITY): Payer: Medicaid Other

## 2015-10-10 ENCOUNTER — Other Ambulatory Visit (HOSPITAL_BASED_OUTPATIENT_CLINIC_OR_DEPARTMENT_OTHER): Payer: Medicaid Other

## 2015-10-10 ENCOUNTER — Other Ambulatory Visit: Payer: Self-pay | Admitting: Nurse Practitioner

## 2015-10-10 ENCOUNTER — Ambulatory Visit (HOSPITAL_BASED_OUTPATIENT_CLINIC_OR_DEPARTMENT_OTHER): Payer: Medicaid Other | Admitting: Nurse Practitioner

## 2015-10-10 ENCOUNTER — Encounter: Payer: Self-pay | Admitting: *Deleted

## 2015-10-10 ENCOUNTER — Encounter: Payer: Self-pay | Admitting: Nurse Practitioner

## 2015-10-10 VITALS — BP 108/72 | HR 85 | Temp 98.1°F | Resp 18 | Ht 67.0 in | Wt 212.6 lb

## 2015-10-10 DIAGNOSIS — C50511 Malignant neoplasm of lower-outer quadrant of right female breast: Secondary | ICD-10-CM | POA: Diagnosis present

## 2015-10-10 DIAGNOSIS — C50919 Malignant neoplasm of unspecified site of unspecified female breast: Secondary | ICD-10-CM

## 2015-10-10 LAB — COMPREHENSIVE METABOLIC PANEL
ALBUMIN: 3.5 g/dL (ref 3.5–5.0)
ALK PHOS: 57 U/L (ref 40–150)
ALT: 17 U/L (ref 0–55)
AST: 11 U/L (ref 5–34)
Anion Gap: 12 mEq/L — ABNORMAL HIGH (ref 3–11)
BUN: 10.5 mg/dL (ref 7.0–26.0)
CALCIUM: 9.2 mg/dL (ref 8.4–10.4)
CHLORIDE: 102 meq/L (ref 98–109)
CO2: 24 mEq/L (ref 22–29)
CREATININE: 0.8 mg/dL (ref 0.6–1.1)
EGFR: 86 mL/min/{1.73_m2} — ABNORMAL LOW (ref >90)
Glucose: 155 mg/dl — ABNORMAL HIGH (ref 70–140)
POTASSIUM: 3.6 meq/L (ref 3.5–5.1)
Sodium: 138 mEq/L (ref 136–145)
Total Bilirubin: 0.54 mg/dL (ref 0.20–1.20)
Total Protein: 6.6 g/dL (ref 6.4–8.3)

## 2015-10-10 LAB — CBC WITH DIFFERENTIAL/PLATELET
BASO%: 0.2 % (ref 0.0–2.0)
Basophils Absolute: 0 10*3/uL (ref 0.0–0.1)
EOS%: 0.3 % (ref 0.0–7.0)
Eosinophils Absolute: 0 10*3/uL (ref 0.0–0.5)
HEMATOCRIT: 33.3 % — AB (ref 34.8–46.6)
HEMOGLOBIN: 11 g/dL — AB (ref 11.6–15.9)
LYMPH#: 2.4 10*3/uL (ref 0.9–3.3)
LYMPH%: 38.2 % (ref 14.0–49.7)
MCH: 32.5 pg (ref 25.1–34.0)
MCHC: 33.1 g/dL (ref 31.5–36.0)
MCV: 98.2 fL (ref 79.5–101.0)
MONO#: 0.6 10*3/uL (ref 0.1–0.9)
MONO%: 10.1 % (ref 0.0–14.0)
NEUT#: 3.2 10*3/uL (ref 1.5–6.5)
NEUT%: 51.2 % (ref 38.4–76.8)
PLATELETS: 132 10*3/uL — AB (ref 145–400)
RBC: 3.39 10*6/uL — ABNORMAL LOW (ref 3.70–5.45)
RDW: 16.1 % — ABNORMAL HIGH (ref 11.2–14.5)
WBC: 6.3 10*3/uL (ref 3.9–10.3)

## 2015-10-10 NOTE — Progress Notes (Signed)
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 She brought it to medical attention when she presented for the free Pap smear screening clinic here 06/01/2015. Exam on that day showed a lump in the right breast at the 4:00 position under the areola. There was also a scar on the right outer breast. This scar was noted in 01/04/2008 when she had her baseline mammogram at [facility]. It had been stable for a year at that time and the patient had treated it with a "blood root paste" which caused scarring. 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 It is in a separate location from the new mass.  On 06/05/2015 Phyllis Wiggins underwent bilateral diagnostic mammography at [facility], with bilateral ultrasonography. This found the breast density to be category B. There was a 2.5 cm oval mass in the right breast at the 5:00 position which by ultrasound measured 2.1 cm. In the left breast there was an area of focal asymmetry which by ultrasound was consistent with fibroglandular tissue/benign.  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  Her subsequent history is as detailed below.  INTERVAL HISTORY: Phyllis Wiggins returns for follow up of her breast cancer, accompanied by her mother. Today is day 8, cycle 46 of carboplatin, docetaxel, trastuzumab, and pertuzumab, with neulasta onpro given on day 2 for granulocyte support. The dose of the carboplatin and docetaxel were reduced by 25% each and aloxi was added to her premed list for longer lasting nausea control. Despite this she still had a poor time managing her symptoms and at this point she is ready to give up on chemotherapy altogether.   REVIEW OF SYSTEMS:  Phyllis Wiggins denies fevers or chills. Her nausea was still out of control. By Sunday she had vomited twice, but she was only using half tablets of compazine up until then because it was making her "loopy." She avoided use of zofran despite knowing this was available to use after 48 hours of the administration of aloxi. She is not eating well but is staying better hydrated. She had diarrhea daily from day 3 onward, but only took imodium daily because she was afraid of constipation. She is uncomfortable and jittery because of the steroids. She is teary during our visit today. She feels "defeated." She does not sleep well. Her energy level is low. A detailed review of systems is otherwise stable.  PAST MEDICAL HISTORY: Past Medical History  Diagnosis Date  . Breast cancer of lower-outer quadrant of right female breast ([place]) 06/20/2015  . Breast cancer ([place])   . Arthritis   . Family history of stomach cancer     PAST SURGICAL HISTORY: Past Surgical History  Procedure  Laterality Date  . Wisdom tooth extraction    . Portacath placement Left 46/08/2014    Procedure: INSERTION PORT-A-CATH;  Surgeon: Autumn Messing III, MD;  Location: [facility] OR;  Service: General;  Laterality: Left;    FAMILY HISTORY Family History  Problem Relation Age of Onset  . Diabetes  Mother   . Hypertension Mother   . Hyperlipidemia Mother   . Stomach cancer Maternal Grandfather 42  . Diabetes Paternal Grandmother   . Dementia Maternal Uncle   . Congestive Heart Failure Maternal Grandmother    the patient's parents are both living, in their early 79s. The patient had no brother, one sister. The patient's mother works by [employer] for private clients. The patient's sister is a Pharmacist, hospital in [place] there is no history of breast or ovarian cancer in the family. The patient's paternal grandfather died at the age of 53 from stomach cancer felt to be related to iron unusual toxic exposure   GYNECOLOGIC HISTORY:  No LMP recorded.  menarche age 45, she is [place] P0  she is still having periods but they're quite sporadic. They may occur 6 weeks apart or 3 weeks apart, may be heavy or only spotting. 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 She is divorced and at home lives with 6 dogs.     ADVANCED DIRECTIVES:  not in place    HEALTH MAINTENANCE: Social History  Substance Use Topics  . Smoking status: Former Smoker    Types: Cigarettes    Quit date: 07/04/2010  . Smokeless tobacco: Never Used  . Alcohol Use: No     Colonoscopy:  PAP: October 46   Bone density:  Lipid panel:  Allergies  Allergen Reactions  . Cheese   . Dairy Aid [Lactase]     "sinus infections"  . Whey     Sinus issues  . 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 prochlorperazine (COMPAZINE) 10 MG tablet Take 1 tablet (10 mg total) by mouth every 6 (six) hours as needed (Nausea or vomiting). 30 tablet 1  . acetaminophen (TYLENOL) 500 MG tablet Take 500 mg by mouth every 6 (six) hours as needed for mild pain. Reported on 10/10/2015    .  dexamethasone (DECADRON) 4 MG tablet Take 2 tablets (8 mg total) by mouth 2 (two) times daily. Start the day before Taxotere. Then again the day after chemo for 3 days. (Patient not taking: Reported on 10/10/2015) 30 tablet 0  . doxycycline (VIBRA-TABS) 100 MG tablet Take 1 tablet (100 mg total) by mouth 2 (two) times daily. (Patient not taking: Reported on 07/28/2015) 30 tablet 1  . ibuprofen (ADVIL,MOTRIN) 200 MG tablet Take 200 mg by mouth every 6 (six) hours as needed for mild pain. Reported on 10/10/2015    . lidocaine-prilocaine (EMLA) cream Apply to affected area once (Patient not taking: Reported on 10/10/2015) 30 g 3  . LORazepam (ATIVAN) 0.5 MG tablet Take 1 tablet (0.5 mg total) by mouth at bedtime. (Patient not taking: Reported on 07/28/2015) 30 tablet 0  . ondansetron (ZOFRAN) 8 MG tablet Take 1 tablet (8 mg total) by mouth 2 (two) times daily. Start the day after chemo for 3 days. Then take as needed for nausea or vomiting. (Patient  not taking: Reported on 10/10/2015) 30 tablet 1  . oxyCODONE-acetaminophen (ROXICET) 5-325 MG tablet Take 1-2 tablets by mouth every 4 (four) hours as needed. (Patient not taking: Reported on 07/28/2015) 50 tablet 0   No current facility-administered medications for this visit.    OBJECTIVE: Middle-aged white woman in no acute distress  Filed Vitals:   10/10/15 0841  BP: 108/72  Pulse: 85  Temp: 98.1 F (36.7 C)  Resp: 18     Body mass index is 33.29 kg/(m^2).    ECOG FS:0 - Asymptomatic   Skin: warm, dry  HEENT: sclerae anicteric, conjunctivae pink, oropharynx clear. No thrush or mucositis.  Lymph Nodes: No cervical or supraclavicular lymphadenopathy  Lungs: clear to auscultation bilaterally, no rales, wheezes, or rhonci  Heart: regular rate and rhythm  Abdomen: round, soft, non tender, positive bowel sounds  Musculoskeletal: No focal spinal tenderness, no peripheral edema  Neuro: non focal, well oriented, positive affect  Breasts: deferred  LAB  RESULTS:  CMP     Component Value Date/Time   NA 138 10/10/2015 0829   K 3.6 10/10/2015 0829   CO2 24 10/10/2015 0829   GLUCOSE 155* 10/10/2015 0829   BUN 10.5 10/10/2015 0829   CREATININE 0.8 10/10/2015 0829   CALCIUM 9.2 10/10/2015 0829   PROT 6.6 10/10/2015 0829   ALBUMIN 3.5 10/10/2015 0829   AST 11 10/10/2015 0829   ALT 17 10/10/2015 0829   ALKPHOS 57 10/10/2015 0829   BILITOT 0.54 10/10/2015 0829    INo results found for: SPEP, UPEP  Lab Results  Component Value Date   WBC 6.3 10/10/2015   NEUTROABS 3.2 10/10/2015   HGB 11.0* 10/10/2015   HCT 33.3* 10/10/2015   MCV 98.2 10/10/2015   PLT 132* 10/10/2015      Chemistry      Component Value Date/Time   NA 138 10/10/2015 0829   K 3.6 10/10/2015 0829   CO2 24 10/10/2015 0829   BUN 10.5 10/10/2015 0829   CREATININE 0.8 10/10/2015 0829      Component Value Date/Time   CALCIUM 9.2 10/10/2015 0829   ALKPHOS 57 10/10/2015 0829   AST 11 10/10/2015 0829   ALT 17 10/10/2015 0829   BILITOT 0.54 10/10/2015 0829       No results found for: LABCA2  No components found for: LABCA125  No results for input(s): INR in the last 168 hours.  Urinalysis No results found for: COLORURINE, APPEARANCEUR, LABSPEC, PHURINE, GLUCOSEU, HGBUR, BILIRUBINUR, KETONESUR, PROTEINUR, UROBILINOGEN, NITRITE, LEUKOCYTESUR  STUDIES: No results found.  ASSESSMENT: 45 y.o. Phyllis Wiggins woman status post left breast biopsy 06/15/2015 for a clinical T1 cN0, stage IA invasive ductal carcinoma, grade 3, estrogen and progesterone receptor negative, HER-2 amplified, with an MIB-1 of 40%  (1) neoadjuvant chemotherapy to consist of carboplatin, docetaxel, pertuzumab, and trastuzumab with onpro support every 3 weeks 6  (2) breast conserving surgery with sentinel lymph node sampling to follow  (3) adjuvant radiation to follow surgery  (4) trastuzumab to be continued to complete a year  (5) genetics testing pending  PLAN: Phyllis Wiggins is ready to  quit chemotherapy altogether at this point, despite the dose reduction made to cycle 5 along with the addition of aloxi. Her rationale is that the expense to her physically during the week of treatment is not going to be worth whatever progress will be made with a 6th dose, and she knows she will be covered by trastuzumab through the end of the year. She already has a surgical  consultation visit scheduled with Dr. [name] on 3/15 to discuss taking out a larger lump if the current tumor has not shrunken enough. Dr. Jana Hakim was present during the visit and would prefer we alter the treatment again with cycle 6, possibly substituting in a new drug altogether. She was willing to weigh this option, but she could not commit to the idea at this time.   Phyllis Wiggins will obtain a breast MRI prior to her next visit here. This will help [place] make further treatment decisions and may be encouraging to her overall.  She will return in 2 weeks for some modified version of cycle 6 of treatment. She understands and agrees with this plan. She knows the goal of treatment in her case is cure. She has been encouraged to call with any issues that might arise before her next visit here.    Laurie Panda, NP   10/10/2015 1:18 PM    ADDENDUM: I can understand Phyllis Wiggins's frustration and the significant problems chemo is causing her. She is close to completing her treatment however. It is not possible for me to say how much risk reduction she would achieve by compleeting her last treatment, but i think a breast MRI now will be helpful.  I would encourage her to complete her last dose, but if she absolutely refuses we can offer her Abraxane alone-- assuming no neuropathy, this should be well tolerated.  I personally saw this patient and performed a substantive portion of this encounter with the listed APP documented above.   Chauncey Cruel, MD Medical Oncology and Hematology [facility] [address], [place] [address] Tel. [phone]    Fax. [phone]

## 2015-10-11 ENCOUNTER — Other Ambulatory Visit: Payer: Self-pay | Admitting: Nurse Practitioner

## 2015-10-12 ENCOUNTER — Other Ambulatory Visit: Payer: Self-pay

## 2015-10-12 ENCOUNTER — Inpatient Hospital Stay (HOSPITAL_COMMUNITY): Admission: RE | Admit: 2015-10-12 | Payer: Medicaid Other | Source: Ambulatory Visit

## 2015-10-12 NOTE — Progress Notes (Signed)
Returned pt call re: late arrival ECHO.  Let pt know ECHO rescheduled to 3/10 at 9 am.  Pt voiced understanding.

## 2015-10-13 ENCOUNTER — Ambulatory Visit (HOSPITAL_COMMUNITY)
Admission: RE | Admit: 2015-10-13 | Discharge: 2015-10-13 | Disposition: A | Discharge status: Home / Self Care | Payer: Medicaid Other | Source: Ambulatory Visit | Attending: Nurse Practitioner | Admitting: Nurse Practitioner

## 2015-10-13 DIAGNOSIS — E669 Obesity, unspecified: Secondary | ICD-10-CM | POA: Insufficient documentation

## 2015-10-13 DIAGNOSIS — C50511 Malignant neoplasm of lower-outer quadrant of right female breast: Secondary | ICD-10-CM | POA: Insufficient documentation

## 2015-10-13 DIAGNOSIS — Z6833 Body mass index (BMI) 33.0-33.9, adult: Secondary | ICD-10-CM | POA: Diagnosis not present

## 2015-10-13 LAB — ECHOCARDIOGRAM LIMITED
A4CEF: 57 %
AORTIC ROOT 2D: 33 mm
CHL CUP LA SIZE INDEX: 1.54 mm/m2
CHL CUP LA VOL 2D INDEX: 22.1 mL/m2
CHL CUP STROKE VOLUME: 48 mL
EERAT: 6.8
EWDT: 211 ms
FS: 37 % (ref 28–44)
IV/PV OW: 1.04
LA ID, A-P, ES: 32 mm
LA VOL 2D: 45.9 mL
LAVOL: 42.5 mL
LAVOLIN: 20.4 mL/m2
LEFT ATRIUM END SYS DIAM: 32 mm
LV SIMPSON'S DISK: 55
LV TDI E'LATERAL: 12.3 cm/s
LV dias vol index: 41 mL/m2
LV sys vol index: 19 mL/m2
LV sys vol: 86 mL — AB
LVDIAVOL: 86 mL (ref 46–106)
LVIDD: 55.3 mm — AB (ref 3.5–6.0)
LVIDS: 34.6 mm — AB (ref 2.1–4.0)
LVOT area: 3.8 cm2
LVOTD: 22 mm
MV Dec: 211 ms
MV Peak grad: 3 mmHg
MV pk A vel: 70.6 cm/s
MV pk E vel: 83.7 cm/s
PW: 7.27 mm — AB (ref 0.6–1.1)
PWSYS: 7.27 mm
SV INDEX: 22.8 mL/m2
TDI e' medial: 12 cm/s

## 2015-10-13 NOTE — Progress Notes (Signed)
Echocardiogram 2D Echocardiogram limited has been performed.  Phyllis Wiggins 10/13/2015, 9:26 AM

## 2015-10-17 ENCOUNTER — Telehealth: Payer: Self-pay | Admitting: *Deleted

## 2015-10-17 ENCOUNTER — Other Ambulatory Visit: Payer: Self-pay | Admitting: Nurse Practitioner

## 2015-10-17 NOTE — Telephone Encounter (Signed)
"  I just received a call fro GI that my MRI for tomorrow has been declined.  I had to reschedule my surgeon appointment with Dr. [name] to Friday at 0850 am."  [employer] notified and will re-submit for medical reconsideration.  "How long will this take.  I do not have very long to wait.  It takes 24 hours for MRI results.  I need to call Dr. [name]."  This nurse will include surgeon with this communication.   [name] asked fro a return call from [facility] Or the nurse about this.  Return number [phone].

## 2015-10-18 ENCOUNTER — Other Ambulatory Visit: Payer: Medicaid Other

## 2015-10-18 ENCOUNTER — Ambulatory Visit
Admission: RE | Admit: 2015-10-18 | Discharge: 2015-10-18 | Disposition: A | Discharge status: Home / Self Care | Payer: Medicaid Other | Source: Ambulatory Visit | Attending: Nurse Practitioner | Admitting: Nurse Practitioner

## 2015-10-18 ENCOUNTER — Inpatient Hospital Stay: Admission: RE | Admit: 2015-10-18 | Payer: Medicaid Other | Source: Ambulatory Visit

## 2015-10-18 DIAGNOSIS — C50511 Malignant neoplasm of lower-outer quadrant of right female breast: Secondary | ICD-10-CM

## 2015-10-18 MED ORDER — GADOBENATE DIMEGLUMINE 529 MG/ML IV SOLN
19.0000 mL | Freq: Once | INTRAVENOUS | Status: AC | PRN
  Administered 2015-10-18: 19 mL via INTRAVENOUS

## 2015-10-19 NOTE — Telephone Encounter (Signed)
MRI performed 10-18-2015.

## 2015-10-20 ENCOUNTER — Other Ambulatory Visit: Payer: Self-pay | Admitting: General Surgery

## 2015-10-20 ENCOUNTER — Telehealth: Payer: Self-pay | Admitting: *Deleted

## 2015-10-20 DIAGNOSIS — C50311 Malignant neoplasm of lower-inner quadrant of right female breast: Secondary | ICD-10-CM

## 2015-10-20 NOTE — Telephone Encounter (Signed)
Corrected appt date/time per 3/8 pof. Pt aware

## 2015-10-20 NOTE — Telephone Encounter (Signed)
Per staff voice message I have moved appts from 3/21 to 3/22

## 2015-10-20 NOTE — Telephone Encounter (Signed)
"  I received a reminder call for appointment 10-24-2015.  I spoke with Nira Conn last week and was told my appointment is moved to 10-25-2015."  Call transferred to scheduler ext [phone].

## 2015-10-24 ENCOUNTER — Other Ambulatory Visit: Payer: Medicaid Other

## 2015-10-24 ENCOUNTER — Ambulatory Visit: Payer: Medicaid Other

## 2015-10-24 ENCOUNTER — Ambulatory Visit: Payer: Medicaid Other | Admitting: Nurse Practitioner

## 2015-10-25 ENCOUNTER — Other Ambulatory Visit: Payer: Self-pay | Admitting: Oncology

## 2015-10-25 ENCOUNTER — Telehealth: Payer: Self-pay | Admitting: Nurse Practitioner

## 2015-10-25 ENCOUNTER — Ambulatory Visit (HOSPITAL_BASED_OUTPATIENT_CLINIC_OR_DEPARTMENT_OTHER): Payer: Medicaid Other | Admitting: Nurse Practitioner

## 2015-10-25 ENCOUNTER — Ambulatory Visit (HOSPITAL_BASED_OUTPATIENT_CLINIC_OR_DEPARTMENT_OTHER): Payer: Medicaid Other

## 2015-10-25 ENCOUNTER — Other Ambulatory Visit (HOSPITAL_BASED_OUTPATIENT_CLINIC_OR_DEPARTMENT_OTHER): Payer: Medicaid Other

## 2015-10-25 ENCOUNTER — Encounter: Payer: Self-pay | Admitting: Nurse Practitioner

## 2015-10-25 VITALS — BP 112/71 | HR 101 | Temp 97.9°F | Resp 19 | Wt 214.4 lb

## 2015-10-25 DIAGNOSIS — Z5112 Encounter for antineoplastic immunotherapy: Secondary | ICD-10-CM | POA: Diagnosis present

## 2015-10-25 DIAGNOSIS — C50511 Malignant neoplasm of lower-outer quadrant of right female breast: Secondary | ICD-10-CM

## 2015-10-25 DIAGNOSIS — R21 Rash and other nonspecific skin eruption: Secondary | ICD-10-CM | POA: Diagnosis not present

## 2015-10-25 DIAGNOSIS — R197 Diarrhea, unspecified: Secondary | ICD-10-CM | POA: Diagnosis not present

## 2015-10-25 DIAGNOSIS — C50919 Malignant neoplasm of unspecified site of unspecified female breast: Secondary | ICD-10-CM

## 2015-10-25 LAB — COMPREHENSIVE METABOLIC PANEL
ALBUMIN: 3.3 g/dL — AB (ref 3.5–5.0)
ALK PHOS: 41 U/L (ref 40–150)
ALT: 18 U/L (ref 0–55)
AST: 17 U/L (ref 5–34)
Anion Gap: 8 mEq/L (ref 3–11)
BUN: 7.6 mg/dL (ref 7.0–26.0)
CALCIUM: 9.3 mg/dL (ref 8.4–10.4)
CHLORIDE: 106 meq/L (ref 98–109)
CO2: 26 mEq/L (ref 22–29)
CREATININE: 0.8 mg/dL (ref 0.6–1.1)
EGFR: >90 mL/min/{1.73_m2} (ref >90)
GLUCOSE: 149 mg/dL — AB (ref 70–140)
POTASSIUM: 3.9 meq/L (ref 3.5–5.1)
SODIUM: 140 meq/L (ref 136–145)
Total Bilirubin: 0.39 mg/dL (ref 0.20–1.20)
Total Protein: 6.3 g/dL — ABNORMAL LOW (ref 6.4–8.3)

## 2015-10-25 LAB — CBC WITH DIFFERENTIAL/PLATELET
BASO%: 0.3 % (ref 0.0–2.0)
Basophils Absolute: 0 10*3/uL (ref 0.0–0.1)
EOS%: 0.1 % (ref 0.0–7.0)
Eosinophils Absolute: 0 10*3/uL (ref 0.0–0.5)
HEMATOCRIT: 33.5 % — AB (ref 34.8–46.6)
HGB: 11 g/dL — ABNORMAL LOW (ref 11.6–15.9)
LYMPH%: 33 % (ref 14.0–49.7)
MCH: 33 pg (ref 25.1–34.0)
MCHC: 32.9 g/dL (ref 31.5–36.0)
MCV: 100.4 fL (ref 79.5–101.0)
MONO#: 0.4 10*3/uL (ref 0.1–0.9)
MONO%: 7.5 % (ref 0.0–14.0)
NEUT%: 59.1 % (ref 38.4–76.8)
NEUTROS ABS: 3.2 10*3/uL (ref 1.5–6.5)
PLATELETS: 238 10*3/uL (ref 145–400)
RBC: 3.33 10*6/uL — ABNORMAL LOW (ref 3.70–5.45)
RDW: 16.1 % — ABNORMAL HIGH (ref 11.2–14.5)
WBC: 5.4 10*3/uL (ref 3.9–10.3)
lymph#: 1.8 10*3/uL (ref 0.9–3.3)

## 2015-10-25 MED ORDER — TRASTUZUMAB CHEMO INJECTION 440 MG
6.0000 mg/kg | Freq: Once | INTRAVENOUS | Status: AC
  Administered 2015-10-25: 609 mg via INTRAVENOUS
  Filled 2015-10-25: qty 29

## 2015-10-25 MED ORDER — SODIUM CHLORIDE 0.9 % IJ SOLN
10.0000 mL | INTRAMUSCULAR | Status: DC | PRN
  Administered 2015-10-25: 10 mL
  Filled 2015-10-25: qty 10

## 2015-10-25 MED ORDER — HEPARIN SOD (PORK) LOCK FLUSH 100 UNIT/ML IV SOLN
500.0000 [IU] | Freq: Once | INTRAVENOUS | Status: AC | PRN
  Administered 2015-10-25: 500 [IU]
  Filled 2015-10-25: qty 5

## 2015-10-25 MED ORDER — DIPHENHYDRAMINE HCL 25 MG PO CAPS
ORAL_CAPSULE | ORAL | Status: AC
  Filled 2015-10-25: qty 1

## 2015-10-25 MED ORDER — ACETAMINOPHEN 325 MG PO TABS
650.0000 mg | ORAL_TABLET | Freq: Once | ORAL | Status: AC
  Administered 2015-10-25: 650 mg via ORAL

## 2015-10-25 MED ORDER — SODIUM CHLORIDE 0.9 % IV SOLN
Freq: Once | INTRAVENOUS | Status: AC
  Administered 2015-10-25: 10:00:00 via INTRAVENOUS

## 2015-10-25 MED ORDER — ACETAMINOPHEN 325 MG PO TABS
ORAL_TABLET | ORAL | Status: AC
  Filled 2015-10-25: qty 2

## 2015-10-25 MED ORDER — DIPHENHYDRAMINE HCL 25 MG PO CAPS
25.0000 mg | ORAL_CAPSULE | Freq: Once | ORAL | Status: AC
  Administered 2015-10-25: 25 mg via ORAL

## 2015-10-25 MED ORDER — SODIUM CHLORIDE 0.9 % IV SOLN
420.0000 mg | Freq: Once | INTRAVENOUS | Status: AC
  Administered 2015-10-25: 420 mg via INTRAVENOUS
  Filled 2015-10-25: qty 14

## 2015-10-25 NOTE — Patient Instructions (Signed)
[facility] Discharge Instructions for Patients Receiving Chemotherapy  Today you received the following chemotherapy agents Herceptin/Perjeta  To help prevent nausea and vomiting after your treatment, we encourage you to take your nausea medication as directed by your MD.  If you develop nausea and vomiting that is not controlled by your nausea medication, call the clinic.   BELOW ARE SYMPTOMS THAT SHOULD BE REPORTED IMMEDIATELY:  *FEVER GREATER THAN 100.5 F  *CHILLS WITH OR WITHOUT FEVER  NAUSEA AND VOMITING THAT IS NOT CONTROLLED WITH YOUR NAUSEA MEDICATION  *UNUSUAL SHORTNESS OF BREATH  *UNUSUAL BRUISING OR BLEEDING  TENDERNESS IN MOUTH AND THROAT WITH OR WITHOUT PRESENCE OF ULCERS  *URINARY PROBLEMS  *BOWEL PROBLEMS  UNUSUAL RASH Items with * indicate a potential emergency and should be followed up as soon as possible.  Feel free to call the clinic you have any questions or concerns. The clinic phone number is [phone].  Please show the [facility] at check-in to the Emergency Department and triage nurse.    Trastuzumab injection for infusion What is this medicine? TRASTUZUMAB (tras TOO zoo mab) is a monoclonal antibody. It is used to treat breast cancer and stomach cancer. This medicine may be used for other purposes; ask your health care provider or pharmacist if you have questions. What should I tell my health care provider before I take this medicine? They need to know if you have any of these conditions: -heart disease -heart failure -infection (especially a virus infection such as chickenpox, cold sores, or herpes) -lung or breathing disease, like asthma -recent or ongoing radiation therapy -an unusual or allergic reaction to trastuzumab, benzyl alcohol, or other medications, foods, dyes, or preservatives -pregnant or trying to get pregnant -breast-feeding How should I use this medicine? This drug is given as an infusion into a  vein. It is administered in a hospital or clinic by a specially trained health care professional. Talk to your pediatrician regarding the use of this medicine in children. This medicine is not approved for use in children. Overdosage: If you think you have taken too much of this medicine contact a poison control center or emergency room at once. NOTE: This medicine is only for you. Do not share this medicine with others. What if I miss a dose? It is important not to miss a dose. Call your doctor or health care professional if you are unable to keep an appointment. What may interact with this medicine? -doxorubicin -warfarin This list may not describe all possible interactions. Give your health care provider a list of all the medicines, herbs, non-prescription drugs, or dietary supplements you use. Also tell them if you smoke, drink alcohol, or use illegal drugs. Some items may interact with your medicine. What should I watch for while using this medicine? Visit your doctor for checks on your progress. Report any side effects. Continue your course of treatment even though you feel ill unless your doctor tells you to stop. Call your doctor or health care professional for advice if you get a fever, chills or sore throat, or other symptoms of a cold or flu. Do not treat yourself. Try to avoid being around people who are sick. You may experience fever, chills and shaking during your first infusion. These effects are usually mild and can be treated with other medicines. Report any side effects during the infusion to your health care professional. Fever and chills usually do not happen with later infusions. Do not become pregnant while taking  this medicine or for 7 months after stopping it. Women should inform their doctor if they wish to become pregnant or think they might be pregnant. Women of child-bearing potential will need to have a negative pregnancy test before starting this medicine. There is a  potential for serious side effects to an unborn child. Talk to your health care professional or pharmacist for more information. Do not breast-feed an infant while taking this medicine or for 7 months after stopping it. Women must use effective birth control with this medicine. What side effects may I notice from receiving this medicine? Side effects that you should report to your doctor or other health care professional as soon as possible: -breathing difficulties -chest pain or palpitations -cough -dizziness or fainting -fever or chills, sore throat -skin rash, itching or hives -swelling of the legs or ankles -unusually weak or tired Side effects that usually do not require medical attention (report to your doctor or other health care professional if they continue or are bothersome): -loss of appetite -headache -muscle aches -nausea This list may not describe all possible side effects. Call your doctor for medical advice about side effects. You may report side effects to FDA at 1-800-FDA-1088. Where should I keep my medicine? This drug is given in a hospital or clinic and will not be stored at home. NOTE: This sheet is a summary. It may not cover all possible information. If you have questions about this medicine, talk to your doctor, pharmacist, or health care provider.    2016, [employer] Standard. (2014-10-28 11:49:32)   Pertuzumab injection What is this medicine? PERTUZUMAB (per TOOZ ue mab) is a monoclonal antibody. It is used to treat breast cancer. This medicine may be used for other purposes; ask your health care provider or pharmacist if you have questions. What should I tell my health care provider before I take this medicine? They need to know if you have any of these conditions: -heart disease -heart failure -high blood pressure -history of irregular heart beat -recent or ongoing radiation therapy -an unusual or allergic reaction to pertuzumab, other medicines,  foods, dyes, or preservatives -pregnant or trying to get pregnant -breast-feeding How should I use this medicine? This medicine is for infusion into a vein. It is given by a health care professional in a hospital or clinic setting. Talk to your pediatrician regarding the use of this medicine in children. Special care may be needed. Overdosage: If you think you have taken too much of this medicine contact a poison control center or emergency room at once. NOTE: This medicine is only for you. Do not share this medicine with others. What if I miss a dose? It is important not to miss your dose. Call your doctor or health care professional if you are unable to keep an appointment. What may interact with this medicine? Interactions are not expected. Give your health care provider a list of all the medicines, herbs, non-prescription drugs, or dietary supplements you use. Also tell them if you smoke, drink alcohol, or use illegal drugs. Some items may interact with your medicine. This list may not describe all possible interactions. Give your health care provider a list of all the medicines, herbs, non-prescription drugs, or dietary supplements you use. Also tell them if you smoke, drink alcohol, or use illegal drugs. Some items may interact with your medicine. What should I watch for while using this medicine? Your condition will be monitored carefully while you are receiving this medicine. Report any side effects.  Continue your course of treatment even though you feel ill unless your doctor tells you to stop. Do not become pregnant while taking this medicine or for 7 months after stopping it. Women should inform their doctor if they wish to become pregnant or think they might be pregnant. Women of child-bearing potential will need to have a negative pregnancy test before starting this medicine. There is a potential for serious side effects to an unborn child. Talk to your health care professional or  pharmacist for more information. Do not breast-feed an infant while taking this medicine or for 7 months after stopping it. Women must use effective birth control with this medicine. Call your doctor or health care professional for advice if you get a fever, chills or sore throat, or other symptoms of a cold or flu. Do not treat yourself. Try to avoid being around people who are sick. You may experience fever, chills, and headache during the infusion. Report any side effects during the infusion to your health care professional. What side effects may I notice from receiving this medicine? Side effects that you should report to your doctor or health care professional as soon as possible: -breathing problems -chest pain or palpitations -dizziness -feeling faint or lightheaded -fever or chills -skin rash, itching or hives -sore throat -swelling of the face, lips, or tongue -swelling of the legs or ankles -unusually weak or tired Side effects that usually do not require medical attention (Report these to your doctor or health care professional if they continue or are bothersome.): -diarrhea -hair loss -nausea, vomiting -tiredness This list may not describe all possible side effects. Call your doctor for medical advice about side effects. You may report side effects to FDA at 1-800-FDA-1088. Where should I keep my medicine? This drug is given in a hospital or clinic and will not be stored at home. NOTE: This sheet is a summary. It may not cover all possible information. If you have questions about this medicine, talk to your doctor, pharmacist, or health care provider.    2016, [employer]/Gold Standard. (2014-10-28 16:07:57)

## 2015-10-25 NOTE — Progress Notes (Signed)
Pt was seen by Gentry Fitz, NP today. She will not get chemo today, but will get Herceptin and Perjeta only.

## 2015-10-25 NOTE — Progress Notes (Signed)
[place]  [phone] Fax:([phone]   ID: Phyllis Wiggins DOB: 06/01/1971  MR#: [id number]  QBH#:[id number]  Patient Care Team: Mora Bellman, MD as PCP - General (Obstetrics and Gynecology) Autumn Messing III, MD as Consulting Physician (General Surgery) Chauncey Cruel, MD as Consulting Physician (Oncology) [name], MD as Consulting Physician (Radiation Oncology) Sylvan Cheese, NP as Nurse Practitioner (Hematology and Oncology) Autumn Messing III, MD as Consulting Physician (General Surgery) PCP: Mora Bellman, MD GYN: OTHER MD:   CHIEF COMPLAINT: HER-2 positive, 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 She brought it to medical attention when she presented for the free Pap smear screening clinic here 06/01/2015. Exam on that day showed a lump in the right breast at the 4:00 position under the areola. There was also a scar on the right outer breast. This scar was noted in 01/04/2008 when she had her baseline mammogram at [facility]. It had been stable for a year at that time and the patient had treated it with a "blood root paste" which caused scarring. 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 It is in a separate location from the new mass.  On 06/05/2015 Phyllis Wiggins underwent bilateral diagnostic mammography at [facility], with bilateral ultrasonography. This found the breast density to be category B. There was a 2.5 cm oval mass in the right breast at the 5:00 position which by ultrasound measured 2.1 cm. In the left breast there was an area of focal asymmetry which by ultrasound was consistent with fibroglandular tissue/benign.  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  Her subsequent history is as detailed below.  INTERVAL HISTORY: Phyllis Wiggins returns for follow up of her breast cancer, accompanied by her mother and sister. Today is day 1, cycle 6 of carboplatin, docetaxel, trastuzumab, and pertuzumab, with neulasta onpro given on day 2 for granulocyte support. The patient had a breast MRI prior to the completion of this regimen, because she is looking to eliminate the 6th cycle due to poor tolerance.    REVIEW OF SYSTEMS:  Phyllis Wiggins is in good spirits today. She denies fevers or chills. Her nausea is under control. She continues to have loose stools, but is only taking imodium once daily. She is eating lots of fibrous vegetables and salads. She is staying well hydrated. She denies mouth sores. She has a rash to her chest and back that is pruritic, but she is not treating it. She is not sleeping well but does not want to use lorazepam. A detailed review of systems is otherwise stable.  PAST MEDICAL HISTORY: Past Medical History  Diagnosis Date  . Breast cancer of lower-outer quadrant of right female breast ([place]) 06/20/2015  . Breast cancer ([place])   . Arthritis   . Family history of stomach cancer     PAST SURGICAL HISTORY: Past Surgical History  Procedure Laterality Date  . Wisdom tooth extraction    . Portacath placement Left 07/06/2015    Procedure: INSERTION PORT-A-CATH;  Surgeon: Autumn Messing III, MD;  Location: [place];  Service: General;  Laterality: Left;    FAMILY HISTORY Family History  Problem Relation Age  of Onset  . Diabetes Mother   . Hypertension Mother   . Hyperlipidemia Mother   . Stomach cancer Maternal Grandfather 73  . Diabetes Paternal Grandmother   . Dementia Maternal Uncle   . Congestive Heart Failure Maternal Grandmother    the patient's parents are both living, in their early  36s. The patient had no brother, one sister. The patient's mother works by [employer] for private clients. The patient's sister is a Pharmacist, hospital in [place] there is no history of breast or ovarian cancer in the family. The patient's paternal grandfather died at the age of 36 from stomach cancer felt to be related to iron unusual toxic exposure   GYNECOLOGIC HISTORY:  No LMP recorded.  menarche age 43, she is Phyllis Wiggins P0  she is still having periods but they're quite sporadic. They may occur 6 weeks apart or 3 weeks apart, may be heavy or only spotting. 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 She is divorced and at home lives with 6 dogs.     ADVANCED DIRECTIVES:  not in place    HEALTH MAINTENANCE: Social History  Substance Use Topics  . Smoking status: Former Smoker    Types: Cigarettes    Quit date: 07/04/2010  . Smokeless tobacco: Never Used  . Alcohol Use: No     Colonoscopy:  PAP: October 2016   Bone density:  Lipid panel:  Allergies  Allergen Reactions  . Cheese   . Dairy Aid [Lactase]     "sinus infections"  . Whey     Sinus issues  . 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 lidocaine-prilocaine (EMLA) cream Apply to affected area once 30 g 3  . acetaminophen (TYLENOL) 500 MG tablet Take 500 mg by mouth every 6 (six) hours as needed for mild pain. Reported on 10/25/2015    . dexamethasone (DECADRON) 4 MG tablet Take 2 tablets (8 mg total) by mouth 2 (two) times daily. Start the day before Taxotere. Then again the day after chemo for 3 days. (Patient not taking: Reported on 10/10/2015) 30 tablet 0  . doxycycline (VIBRA-TABS) 100 MG tablet Take 1 tablet (100 mg total) by mouth 2 (two) times daily. (Patient not taking:  Reported on 07/28/2015) 30 tablet 1  . ibuprofen (ADVIL,MOTRIN) 200 MG tablet Take 200 mg by mouth every 6 (six) hours as needed for mild pain. Reported on 10/25/2015    . ondansetron (ZOFRAN) 8 MG tablet Take 1 tablet (8 mg total) by mouth 2 (two) times daily. Start the day after chemo for 3 days. Then take as needed for nausea or vomiting. (Patient not taking: Reported on 10/10/2015) 30 tablet 1  . oxyCODONE-acetaminophen (ROXICET) 5-325 MG tablet Take 1-2 tablets by mouth every 4 (four) hours as needed. (Patient not taking: Reported on 07/28/2015) 50 tablet 0  . prochlorperazine (COMPAZINE) 10 MG tablet Take 1 tablet (10 mg total) by mouth every 6 (six) hours as needed (Nausea or vomiting). (Patient not taking: Reported on 10/25/2015) 30 tablet 1   No current facility-administered medications for this visit.    OBJECTIVE: Middle-aged white woman in no acute distress  Filed Vitals:   10/25/15 0909  BP: 112/71  Pulse: 101  Temp: 97.9 F (36.6 C)  Resp: 19     Body mass index is 33.57 kg/(m^2).    ECOG FS:0 - Asymptomatic   Skin: warm, dry, scattered erythematous papules to upper chest, and upper and lower back HEENT: sclerae anicteric, conjunctivae pink, oropharynx clear. No thrush or mucositis.  Lymph Nodes: No cervical or supraclavicular lymphadenopathy  Lungs: clear to auscultation bilaterally, no rales, wheezes, or rhonci  Heart: regular rate and rhythm  Abdomen: round, soft, non tender, positive bowel sounds  Musculoskeletal: No focal spinal tenderness, no peripheral edema  Neuro: non focal, well oriented, positive affect  Breasts: deferred  LAB RESULTS:  CMP     Component Value Date/Time   NA 140 10/25/2015 0855   K 3.9 10/25/2015 0855   CO2 26 10/25/2015 0855   GLUCOSE 149* 10/25/2015 0855   BUN 7.6 10/25/2015 0855   CREATININE 0.8 10/25/2015 0855   CALCIUM 9.3 10/25/2015 0855   PROT 6.3* 10/25/2015 0855   ALBUMIN 3.3* 10/25/2015 0855   AST 17 10/25/2015 0855   ALT 18  10/25/2015 0855   ALKPHOS 41 10/25/2015 0855   BILITOT 0.39 10/25/2015 0855    INo results found for: SPEP, UPEP  Lab Results  Component Value Date   WBC 5.4 10/25/2015   NEUTROABS 3.2 10/25/2015   HGB 11.0* 10/25/2015   HCT 33.5* 10/25/2015   MCV 100.4 10/25/2015   PLT 238 10/25/2015      Chemistry      Component Value Date/Time   NA 140 10/25/2015 0855   K 3.9 10/25/2015 0855   CO2 26 10/25/2015 0855   BUN 7.6 10/25/2015 0855   CREATININE 0.8 10/25/2015 0855      Component Value Date/Time   CALCIUM 9.3 10/25/2015 0855   ALKPHOS 41 10/25/2015 0855   AST 17 10/25/2015 0855   ALT 18 10/25/2015 0855   BILITOT 0.39 10/25/2015 0855       No results found for: LABCA2  No components found for: LABCA125  No results for input(s): INR in the last 168 hours.  Urinalysis No results found for: COLORURINE, APPEARANCEUR, LABSPEC, PHURINE, GLUCOSEU, HGBUR, BILIRUBINUR, KETONESUR, PROTEINUR, UROBILINOGEN, NITRITE, LEUKOCYTESUR  STUDIES: Mr Breast Bilateral W Wo Contrast  10/18/2015  CLINICAL DATA:  45 year old female with recent diagnosis of invasive right breast cancer in the lower-inner quadrant following ultrasound-guided biopsy on 06/15/2015 at [place]. An MRI on 06/21/2015 demonstrated a second enhancing mass adjacent to a larger fibroadenoma in the superior right breast. This was subsequently biopsied under ultrasound guidance demonstrating a benign fibroadenoma. The patient has since undergone 5 of 6 cycles of neoadjuvant chemotherapy. LABS:  Not applicable. EXAM: BILATERAL BREAST MRI WITH AND WITHOUT CONTRAST TECHNIQUE: Multiplanar, multisequence MR images of both breasts were obtained prior to and following the intravenous administration of 19 ml of MultiHance. THREE-DIMENSIONAL MR IMAGE RENDERING ON INDEPENDENT WORKSTATION: Three-dimensional MR images were rendered by post-processing of the original MR data on an independent workstation. The three-dimensional MR images  were interpreted, and findings are reported in the following complete MRI report for this study. Three dimensional images were evaluated at the independent DynaCad workstation COMPARISON:  Previous exam(s). FINDINGS: Breast composition: b. Scattered fibroglandular tissue. Background parenchymal enhancement: Minimal. There is a near complete reduction in the background parenchymal enhancement since the prior exam. Right breast: Complete resolution of the enhancing mass in the lower-inner quadrant of the right breast. Susceptibility artifact is identified at this site, consistent with the biopsy marking clip. Susceptibility is also seen in the superior right breast adjacent to  the larger benign mass, likely corresponding to the more recently biopsied fibroadenoma. At the time of this dictation, the post biopsy mammogram from that biopsy is not available for correlation. Left breast: No mass or abnormal enhancement. Lymph nodes: No abnormal appearing lymph nodes. Ancillary findings: There are multiple bilateral enhancing foci within the skin in the bilateral breasts with persistent kinetics. These are bright on T2 weighted images. Examples are seen in the right breast upper outer quadrant (series 12, images 81 and 87) and in the lower inner quadrant (images 144 and 171), and in the left breast lower inner quadrant (image 154 and medially in image 117). IMPRESSION: 1. Complete resolution of the enhancing known malignancy in the lower-inner quadrant of the right breast. 2. Multiple indeterminate bilateral enhancing foci within the skin of the breasts. 3.  No MRI evidence of malignancy in the left breast. RECOMMENDATION: 1. Physical exam of the skin of the bilateral breasts is recommended to correlate with the small enhancing foci identified bilaterally. 2.  Continued treatment plan for the known right breast malignancy. BI-RADS CATEGORY  6: Known biopsy-proven malignancy. Electronically Signed   By: Ammie Ferrier  M.D.   On: 10/18/2015 12:02    ASSESSMENT: 45 y.o. [place] woman status post left breast biopsy 06/15/2015 for a clinical T1 cN0, stage IA invasive ductal carcinoma, grade 3, estrogen and progesterone receptor negative, HER-2 amplified, with an MIB-1 of 40%  (1) neoadjuvant chemotherapy to consist of carboplatin, docetaxel, pertuzumab, and trastuzumab with onpro support every 3 weeks 6 started 07/11/2015  (a) carboplatin and docetaxel stopped after 5 cycles due to patient intolerance  (2) breast conserving surgery with sentinel lymph node sampling to follow  (3) adjuvant radiation to follow surgery  (4) trastuzumab to be continued to complete a year  (5) genetics testing pending  PLAN: Phyllis Wiggins and I reviewed the results of her breast MRI. That showed a complete resolution of the known malignancy to the right breast. Given this finding, Phyllis Wiggins is even more resolute than before that she would like to stop the carboplatin and docetaxel. She is willing to finish out with her last pertuzumab dose today, and continue with the trastuzumab as prescribed every 3 weeks. Her most recent echocardiogram showed a well preserved ejection fraction.  I advised her that she is well underdosing herself with the imodium, but she prefers it this way because she is fearful of constipation. I also advised her to apply a cortisone cream to the rash on her chest and back, but she declined.   Phyllis Wiggins expects to have a lumpectomy in the next 2-3 weeks. She will follow up with Dr. Jana Hakim in 6 weeks. She understands and agrees with this plan. She knows the goal of treatment in her case is cure. She has been encouraged to call with any issues that might arise before her next visit here.   Laurie Panda, NP   10/25/2015 10:09 AM

## 2015-10-25 NOTE — Telephone Encounter (Signed)
appt made and avs will be printed in treatment room

## 2015-10-31 ENCOUNTER — Other Ambulatory Visit: Payer: Medicaid Other

## 2015-10-31 ENCOUNTER — Ambulatory Visit: Payer: Medicaid Other | Admitting: Nurse Practitioner

## 2015-11-01 ENCOUNTER — Encounter (HOSPITAL_BASED_OUTPATIENT_CLINIC_OR_DEPARTMENT_OTHER): Payer: Self-pay | Admitting: *Deleted

## 2015-11-08 NOTE — Anesthesia Preprocedure Evaluation (Addendum)
Anesthesia Evaluation  Patient identified by MRN, date of birth, ID band Patient awake    Reviewed: Allergy & Precautions, NPO status , Patient's Chart, lab work & pertinent test results  Airway Mallampati: II  TM Distance: >3 FB Neck ROM: Full    Dental no notable dental hx. (+) Dental Advisory Given   Pulmonary former smoker,    Pulmonary exam normal        Cardiovascular negative cardio ROS Normal cardiovascular exam     Neuro/Psych  Neuromuscular disease negative psych ROS   GI/Hepatic negative GI ROS, Neg liver ROS,   Endo/Other  Morbid obesity  Renal/GU negative Renal ROS     Musculoskeletal  (+) Arthritis ,   Abdominal   Peds  Hematology   Anesthesia Other Findings   Reproductive/Obstetrics                            Anesthesia Physical  Anesthesia Plan  ASA: II  Anesthesia Plan: General   Post-op Pain Management: GA combined w/ Regional for post-op pain   Induction: Intravenous  Airway Management Planned: LMA  Additional Equipment:   Intra-op Plan:   Post-operative Plan: Extubation in OR  Informed Consent: I have reviewed the patients History and Physical, chart, labs and discussed the procedure including the risks, benefits and alternatives for the proposed anesthesia with the patient or authorized representative who has indicated his/her understanding and acceptance.   Dental advisory given  Plan Discussed with: Anesthesiologist, CRNA and Surgeon  Anesthesia Plan Comments:        Anesthesia Quick Evaluation

## 2015-11-09 ENCOUNTER — Ambulatory Visit (HOSPITAL_BASED_OUTPATIENT_CLINIC_OR_DEPARTMENT_OTHER): Payer: Medicaid Other | Admitting: Anesthesiology

## 2015-11-09 ENCOUNTER — Encounter (HOSPITAL_BASED_OUTPATIENT_CLINIC_OR_DEPARTMENT_OTHER): Payer: Self-pay | Admitting: Anesthesiology

## 2015-11-09 ENCOUNTER — Encounter (HOSPITAL_BASED_OUTPATIENT_CLINIC_OR_DEPARTMENT_OTHER)
Admission: RE | Disposition: A | Discharge status: Home / Self Care | Payer: Self-pay | Source: Ambulatory Visit | Attending: General Surgery

## 2015-11-09 ENCOUNTER — Ambulatory Visit (HOSPITAL_BASED_OUTPATIENT_CLINIC_OR_DEPARTMENT_OTHER)
Admission: RE | Admit: 2015-11-09 | Discharge: 2015-11-09 | Disposition: A | Discharge status: Home / Self Care | Payer: Medicaid Other | Source: Ambulatory Visit | Attending: General Surgery | Admitting: General Surgery

## 2015-11-09 ENCOUNTER — Ambulatory Visit (HOSPITAL_COMMUNITY)
Admission: RE | Admit: 2015-11-09 | Discharge: 2015-11-09 | Disposition: A | Discharge status: Home / Self Care | Payer: Medicaid Other | Source: Ambulatory Visit | Attending: General Surgery | Admitting: General Surgery

## 2015-11-09 DIAGNOSIS — Z6833 Body mass index (BMI) 33.0-33.9, adult: Secondary | ICD-10-CM | POA: Diagnosis not present

## 2015-11-09 DIAGNOSIS — C50311 Malignant neoplasm of lower-inner quadrant of right female breast: Secondary | ICD-10-CM | POA: Insufficient documentation

## 2015-11-09 DIAGNOSIS — C50911 Malignant neoplasm of unspecified site of right female breast: Secondary | ICD-10-CM | POA: Diagnosis present

## 2015-11-09 DIAGNOSIS — Z87891 Personal history of nicotine dependence: Secondary | ICD-10-CM | POA: Insufficient documentation

## 2015-11-09 HISTORY — PX: BREAST LUMPECTOMY WITH RADIOACTIVE SEED AND SENTINEL LYMPH NODE BIOPSY: SHX6550

## 2015-11-09 SURGERY — BREAST LUMPECTOMY WITH RADIOACTIVE SEED AND SENTINEL LYMPH NODE BIOPSY
Anesthesia: General | Site: Breast | Laterality: Right

## 2015-11-09 MED ORDER — PROMETHAZINE HCL 25 MG/ML IJ SOLN
6.2500 mg | INTRAMUSCULAR | Status: DC | PRN
Start: 2015-11-09 — End: 2015-11-09

## 2015-11-09 MED ORDER — PROPOFOL 10 MG/ML IV BOLUS
INTRAVENOUS | Status: DC | PRN
  Administered 2015-11-09: 50 mg via INTRAVENOUS
  Administered 2015-11-09: 150 mg via INTRAVENOUS

## 2015-11-09 MED ORDER — FENTANYL CITRATE (PF) 100 MCG/2ML IJ SOLN
INTRAMUSCULAR | Status: DC | PRN
  Administered 2015-11-09: 25 ug via INTRAVENOUS
  Administered 2015-11-09: 100 ug via INTRAVENOUS
  Administered 2015-11-09: 25 ug via INTRAVENOUS
  Administered 2015-11-09: 50 ug via INTRAVENOUS

## 2015-11-09 MED ORDER — GLYCOPYRROLATE 0.2 MG/ML IJ SOLN: 0.2000 mg | Freq: Once | INTRAMUSCULAR | Status: DC | PRN

## 2015-11-09 MED ORDER — FENTANYL CITRATE (PF) 100 MCG/2ML IJ SOLN
50.0000 ug | INTRAMUSCULAR | Status: DC | PRN
  Administered 2015-11-09: 100 ug via INTRAVENOUS

## 2015-11-09 MED ORDER — SCOPOLAMINE 1 MG/3DAYS TD PT72: 1.0000 | MEDICATED_PATCH | Freq: Once | TRANSDERMAL | Status: DC | PRN

## 2015-11-09 MED ORDER — PROPOFOL 10 MG/ML IV BOLUS
INTRAVENOUS | Status: AC
  Filled 2015-11-09: qty 40

## 2015-11-09 MED ORDER — TECHNETIUM TC 99M SULFUR COLLOID FILTERED
1.0000 | Freq: Once | INTRAVENOUS | Status: AC | PRN
  Administered 2015-11-09: 1 via INTRADERMAL

## 2015-11-09 MED ORDER — BUPIVACAINE-EPINEPHRINE (PF) 0.5% -1:200000 IJ SOLN
INTRAMUSCULAR | Status: DC | PRN
  Administered 2015-11-09: 30 mL

## 2015-11-09 MED ORDER — FENTANYL CITRATE (PF) 100 MCG/2ML IJ SOLN
INTRAMUSCULAR | Status: AC
Start: 2015-11-09 — End: 2015-11-09
  Filled 2015-11-09: qty 2

## 2015-11-09 MED ORDER — CHLORHEXIDINE GLUCONATE 4 % EX LIQD: 1.0000 "application " | Freq: Once | CUTANEOUS | Status: DC

## 2015-11-09 MED ORDER — ONDANSETRON HCL 4 MG/2ML IJ SOLN
INTRAMUSCULAR | Status: DC | PRN
  Administered 2015-11-09: 4 mg via INTRAVENOUS

## 2015-11-09 MED ORDER — SCOPOLAMINE 1 MG/3DAYS TD PT72: 1.0000 | MEDICATED_PATCH | TRANSDERMAL | Status: DC

## 2015-11-09 MED ORDER — PHENYLEPHRINE HCL 10 MG/ML IJ SOLN
INTRAMUSCULAR | Status: DC | PRN
  Administered 2015-11-09 (×3): 40 ug via INTRAVENOUS

## 2015-11-09 MED ORDER — PHENYLEPHRINE 40 MCG/ML (10ML) SYRINGE FOR IV PUSH (FOR BLOOD PRESSURE SUPPORT)
PREFILLED_SYRINGE | INTRAVENOUS | Status: AC
  Filled 2015-11-09: qty 10

## 2015-11-09 MED ORDER — HYDROMORPHONE HCL 1 MG/ML IJ SOLN
INTRAMUSCULAR | Status: AC
  Filled 2015-11-09: qty 1

## 2015-11-09 MED ORDER — FENTANYL CITRATE (PF) 100 MCG/2ML IJ SOLN
INTRAMUSCULAR | Status: AC
  Filled 2015-11-09: qty 2

## 2015-11-09 MED ORDER — BUPIVACAINE HCL (PF) 0.25 % IJ SOLN
INTRAMUSCULAR | Status: AC
  Filled 2015-11-09: qty 30

## 2015-11-09 MED ORDER — MIDAZOLAM HCL 2 MG/2ML IJ SOLN
1.0000 mg | INTRAMUSCULAR | Status: DC | PRN
  Administered 2015-11-09: 2 mg via INTRAVENOUS

## 2015-11-09 MED ORDER — MIDAZOLAM HCL 5 MG/5ML IJ SOLN
INTRAMUSCULAR | Status: DC | PRN
  Administered 2015-11-09: 2 mg via INTRAVENOUS

## 2015-11-09 MED ORDER — METHYLENE BLUE 0.5 % INJ SOLN
INTRAVENOUS | Status: AC
  Filled 2015-11-09: qty 10

## 2015-11-09 MED ORDER — LIDOCAINE HCL (CARDIAC) 20 MG/ML IV SOLN
INTRAVENOUS | Status: DC | PRN
  Administered 2015-11-09: 50 mg via INTRAVENOUS

## 2015-11-09 MED ORDER — SODIUM CHLORIDE 0.9 % IJ SOLN
INTRAVENOUS | Status: DC | PRN
  Administered 2015-11-09: 4 mL via INTRAMUSCULAR

## 2015-11-09 MED ORDER — LIDOCAINE HCL (CARDIAC) 20 MG/ML IV SOLN
INTRAVENOUS | Status: AC
  Filled 2015-11-09: qty 5

## 2015-11-09 MED ORDER — OXYCODONE-ACETAMINOPHEN 5-325 MG PO TABS: 1.0000 | ORAL_TABLET | ORAL | Status: DC | PRN

## 2015-11-09 MED ORDER — ONDANSETRON HCL 4 MG/2ML IJ SOLN
INTRAMUSCULAR | Status: AC
  Filled 2015-11-09: qty 2

## 2015-11-09 MED ORDER — MIDAZOLAM HCL 2 MG/2ML IJ SOLN
INTRAMUSCULAR | Status: AC
  Filled 2015-11-09: qty 2

## 2015-11-09 MED ORDER — OXYCODONE-ACETAMINOPHEN 5-325 MG PO TABS
1.0000 | ORAL_TABLET | ORAL | Status: DC | PRN
  Administered 2015-11-09: 1 via ORAL

## 2015-11-09 MED ORDER — SODIUM CHLORIDE 0.9 % IJ SOLN
INTRAMUSCULAR | Status: AC
  Filled 2015-11-09: qty 10

## 2015-11-09 MED ORDER — LACTATED RINGERS IV SOLN
INTRAVENOUS | Status: DC
  Administered 2015-11-09: 10 mL/h via INTRAVENOUS
  Administered 2015-11-09: 10:00:00 via INTRAVENOUS

## 2015-11-09 MED ORDER — DEXAMETHASONE SODIUM PHOSPHATE 10 MG/ML IJ SOLN
INTRAMUSCULAR | Status: AC
Start: 2015-11-09 — End: 2015-11-09
  Filled 2015-11-09: qty 1

## 2015-11-09 MED ORDER — CEFAZOLIN SODIUM-DEXTROSE 2-4 GM/100ML-% IV SOLN
INTRAVENOUS | Status: AC
  Filled 2015-11-09: qty 100

## 2015-11-09 MED ORDER — BUPIVACAINE HCL (PF) 0.25 % IJ SOLN
INTRAMUSCULAR | Status: DC | PRN
  Administered 2015-11-09: 16.5 mL

## 2015-11-09 MED ORDER — OXYCODONE-ACETAMINOPHEN 5-325 MG PO TABS
ORAL_TABLET | ORAL | Status: AC
  Filled 2015-11-09: qty 1

## 2015-11-09 MED ORDER — CEFAZOLIN SODIUM 1 G IJ SOLR
2.0000 g | INTRAMUSCULAR | Status: AC
  Administered 2015-11-09: 2 g via INTRAVENOUS

## 2015-11-09 MED ORDER — DEXAMETHASONE SODIUM PHOSPHATE 4 MG/ML IJ SOLN
INTRAMUSCULAR | Status: DC | PRN
  Administered 2015-11-09: 10 mg via INTRAVENOUS

## 2015-11-09 MED ORDER — HYDROMORPHONE HCL 1 MG/ML IJ SOLN
0.2500 mg | INTRAMUSCULAR | Status: DC | PRN
  Administered 2015-11-09 (×3): 0.5 mg via INTRAVENOUS

## 2015-11-09 SURGICAL SUPPLY — 45 items
APPLIER CLIP 9.375 MED OPEN (MISCELLANEOUS) ×3
BLADE SURG 15 STRL LF DISP TIS (BLADE) ×1 IMPLANT
BLADE SURG 15 STRL SS (BLADE) ×2
CANISTER SUC SOCK COL 7IN (MISCELLANEOUS) IMPLANT
CANISTER SUCT 1200ML W/VALVE (MISCELLANEOUS) IMPLANT
CHLORAPREP W/TINT 26ML (MISCELLANEOUS) ×3 IMPLANT
CLIP APPLIE 9.375 MED OPEN (MISCELLANEOUS) ×1 IMPLANT
COVER BACK TABLE 60X90IN (DRAPES) ×3 IMPLANT
COVER MAYO STAND STRL (DRAPES) ×3 IMPLANT
COVER PROBE W GEL 5X96 (DRAPES) ×3 IMPLANT
DECANTER SPIKE VIAL GLASS SM (MISCELLANEOUS) ×3 IMPLANT
DEVICE DUBIN W/COMP PLATE 8390 (MISCELLANEOUS) ×3 IMPLANT
DRAPE LAPAROSCOPIC ABDOMINAL (DRAPES) ×3 IMPLANT
DRAPE UTILITY XL STRL (DRAPES) ×3 IMPLANT
ELECT COATED BLADE 2.86 ST (ELECTRODE) ×3 IMPLANT
ELECT REM PT RETURN 9FT ADLT (ELECTROSURGICAL) ×3
ELECTRODE REM PT RTRN 9FT ADLT (ELECTROSURGICAL) ×1 IMPLANT
GLOVE BIO SURGEON STRL SZ7.5 (GLOVE) ×3 IMPLANT
GLOVE BIOGEL PI IND STRL 7.0 (GLOVE) ×2 IMPLANT
GLOVE BIOGEL PI IND STRL 7.5 (GLOVE) ×1 IMPLANT
GLOVE BIOGEL PI INDICATOR 7.0 (GLOVE) ×4
GLOVE BIOGEL PI INDICATOR 7.5 (GLOVE) ×2
GLOVE ECLIPSE 6.5 STRL STRAW (GLOVE) ×3 IMPLANT
GLOVE SURG SS PI 7.0 STRL IVOR (GLOVE) ×3 IMPLANT
GOWN STRL REUS W/ TWL LRG LVL3 (GOWN DISPOSABLE) ×2 IMPLANT
GOWN STRL REUS W/TWL LRG LVL3 (GOWN DISPOSABLE) ×4
KIT MARKER MARGIN INK (KITS) ×3 IMPLANT
LIQUID BAND (GAUZE/BANDAGES/DRESSINGS) ×3 IMPLANT
NDL SAFETY ECLIPSE 18X1.5 (NEEDLE) ×1 IMPLANT
NEEDLE HYPO 18GX1.5 SHARP (NEEDLE) ×2
NEEDLE HYPO 25X1 1.5 SAFETY (NEEDLE) ×3 IMPLANT
NS IRRIG 1000ML POUR BTL (IV SOLUTION) IMPLANT
PACK BASIN DAY SURGERY FS (CUSTOM PROCEDURE TRAY) ×3 IMPLANT
PENCIL BUTTON HOLSTER BLD 10FT (ELECTRODE) ×3 IMPLANT
SLEEVE SCD COMPRESS KNEE MED (MISCELLANEOUS) ×3 IMPLANT
SPONGE LAP 18X18 X RAY DECT (DISPOSABLE) ×3 IMPLANT
SUT MON AB 4-0 PC3 18 (SUTURE) ×9 IMPLANT
SUT SILK 2 0 SH (SUTURE) ×3 IMPLANT
SUT VICRYL 3-0 CR8 SH (SUTURE) ×3 IMPLANT
SYR CONTROL 10ML LL (SYRINGE) ×9 IMPLANT
TOWEL OR 17X24 6PK STRL BLUE (TOWEL DISPOSABLE) ×3 IMPLANT
TOWEL OR NON WOVEN STRL DISP B (DISPOSABLE) ×3 IMPLANT
TUBE CONNECTING 20'X1/4 (TUBING)
TUBE CONNECTING 20X1/4 (TUBING) IMPLANT
YANKAUER SUCT BULB TIP NO VENT (SUCTIONS) IMPLANT

## 2015-11-09 NOTE — Anesthesia Procedure Notes (Addendum)
Anesthesia Regional Block:  Pectoralis block  Pre-Anesthetic Checklist: ,, timeout performed, Correct Patient, Correct Site, Correct Laterality, Correct Procedure, Correct Position, site marked, Risks and benefits discussed,  Surgical consent,  Pre-op evaluation,  At surgeon's request and post-op pain management  Laterality: Right  Prep: chloraprep       Needles:  Injection technique: Single-shot  Needle Type: Echogenic Stimulator Needle     Needle Length: 10cm 10 cm Needle Gauge: 21 and 21 G    Additional Needles:  Procedures: ultrasound guided (picture in chart) Pectoralis block Narrative:  Start time: 11/09/2015 7:56 AM End time: 11/09/2015 8:04 AM Injection made incrementally with aspirations every 5 mL.  Performed by: Personally    Procedure Name: LMA Insertion Date/Time: 11/09/2015 8:47 AM Performed by: Marrianne Mood Pre-anesthesia Checklist: Patient identified, Emergency Drugs available, Suction available and Patient being monitored Patient Re-evaluated:Patient Re-evaluated prior to inductionOxygen Delivery Method: Circle System Utilized Preoxygenation: Pre-oxygenation with 100% oxygen Intubation Type: IV induction Ventilation: Mask ventilation without difficulty LMA: LMA inserted LMA Size: 4.0 Number of attempts: 1 Airway Equipment and Method: Bite block Placement Confirmation: positive ETCO2 Tube secured with: Tape Dental Injury: Teeth and Oropharynx as per pre-operative assessment

## 2015-11-09 NOTE — Op Note (Signed)
11/09/2015  10:25 AM  PATIENT:  Phyllis Wiggins  45 y.o. 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  PROCEDURE:  Procedure(s): RIGHT BREAST LUMPECTOMY WITH RADIOACTIVE SEED AND SENTINEL LYMPH NODE BIOPSY AND EXCISION OF RIGHT BREAST FIBROADENOMA (Right) with injection blue dye  SURGEON:  Surgeon(s) and Role:    * Autumn Messing III, MD - Primary  PHYSICIAN ASSISTANT:   ASSISTANTS: none   ANESTHESIA:   general  EBL:  Total I/O In: 1400 [I.V.:1400] Out: -   BLOOD ADMINISTERED:none  DRAINS: none   LOCAL MEDICATIONS USED:  MARCAINE     SPECIMEN:  Source of Specimen:  right breast tissue with additional superior and medial margins, right breast fibroadenoma, and sentinel nodes X 3  DISPOSITION OF SPECIMEN:  PATHOLOGY  COUNTS:  YES  TOURNIQUET:  * No tourniquets in log *  DICTATION: .Dragon Dictation   After informed consent was obtained the patient was brought to the operating room and placed in the supine position on the operating room table. After adequate induction of general anesthesia the patient's right chest, breast, and axillary areas were prepped with ChloraPrep, allowed to dry, and draped in usual sterile manner. An appropriate timeout was performed. Earlier in the day the patient underwent injection of 1 mCi of technetium sulfur colloid in the subareolar position on the right. Previously an I-125 seed was placed in the lower inner quadrant of the right breast to mark an area of invasive breast cancer. She underwent neoadjuvant chemotherapy and had a complete radiographic response. At this point, 2 mL of methylene blue and 3 mL of injectable saline were injected in the subareolar position of the right breast and the breast was massaged for several minutes. The neoprobe was set to technetium in the right axilla was examined. A hot spot was identified. A small  transversely oriented incision was made with a 15 blade knife overlying the hot spot. The incision was carried through the skin and subcutaneous tissue sharply with electrocautery until the axilla was entered. A [place] retractor was deployed. Using the neoprobe to direct blunt hemostat dissection I was able to identify 3 lymph nodes. These lymph nodes were excised sharply with electrocautery and the lymphatics were controlled with clips. Ex vivo counts on sentinel node #1 was approximately 300. Ex vivo counts on sentinel node #2 were approximately 50. Sentinel node #3 was palpable with no radioactivity. There were no other hot or palpable or blue lymph nodes in the right axilla that could be identified. The wound was irrigated with saline and infiltrated with quarter percent Marcaine. The deep layer of the wound was then closed with interrupted 3-0 Vicryl stitches. The skin was then closed with a running 4-0 Monocryl subcuticular stitch. Attention was then turned to the right breast. The neoprobe was set to I-125. The area of radioactivity was readily identified in the lower inner right breast at the edge of the areola. A curvilinear incision was made along the inner and lower edge of the areola with the 15 blade knife. The incision was carried through the skin and subcutaneous tissue sharply with electrocautery. While checking the area of radioactivity frequently a circular portion of breast tissue was excised sharply around the radioactive seed. Once the specimen was removed it was oriented with the appropriate paint colors. A specimen radiograph was obtained that showed the clip in seed to be in the specimen but somewhat close to  the superior and medial margins. Additional superior and medial margins were taken sharply with the electrocautery and were marked with the appropriate paint color. The specimens were then sent to pathology for further evaluation in the seed was confirmed. In the upper outer right  breast the patient also had a fibroadenoma that was palpable. At this point through the lumpectomy cavity I could readily palpate the fibroadenoma. The dissection was then carried towards the fibroadenoma until it was encountered and the fibroadenoma was then excised sharply with the electrocautery. This was also sent to pathology for further evaluation. Hemostasis was achieved using the Bovie electrocautery. The wound was then irrigated with saline and infiltrated with quarter percent Marcaine. The deep layer of the wound was then closed with layers of interrupted 3-0 Vicryl stitches. The cavity was marked with clips. The skin was then closed with interrupted 4-0 Monocryl subcuticular stitches. Dermabond dressings were applied. The patient tolerated the procedure well. At the end of the case all needle sponge and instrument counts were correct. The patient was then awakened and taken to recovery in stable condition.  PLAN OF CARE: Discharge to home after PACU  PATIENT DISPOSITION:  PACU - hemodynamically stable.   Delay start of Pharmacological VTE agent (>24hrs) due to surgical blood loss or risk of bleeding: not applicable

## 2015-11-09 NOTE — Discharge Instructions (Signed)

## 2015-11-09 NOTE — H&P (Signed)
Phyllis Wiggins  Location: [facility] Patient #: [id number] DOB: 05-02-1971 Divorced / Language: [name] / Race: White Female   History of Present Illness The patient is a 45 year old female who presents for a follow-up for Breast cancer. The patient is a 45 year old white female who was initially diagnosed with a 2 cm cancer in the lower inner subareolar right breast. She has been receiving neoadjuvant chemotherapy. She has struggled to finished the treatment course. Her most recent MRI showed complete radiographic response. She is now ready to schedule her definitive surgery. Her last dose of chemotherapy was about 2 weeks ago.   Allergies Whey Protein *NUTRIENTS* No Known Drug Allergies03/17/2017 (Marked as Inactive) WINE  Medication History  Medications Reconciled Ibuprofen (600MG  Tablet, Oral) Active.    Review of Systems  General Present- Night Sweats. Not Present- Appetite Loss, Chills, Fatigue, Fever, Weight Gain and Weight Loss. Skin Present- Change in Wart/Mole. Not Present- Dryness, Hives, Jaundice, New Lesions, Non-Healing Wounds, Rash and Ulcer. HEENT Present- Hearing Loss and Wears glasses/contact lenses. Not Present- Earache, Hoarseness, Nose Bleed, Oral Ulcers, Ringing in the Ears, Seasonal Allergies, Sinus Pain, Sore Throat, Visual Disturbances and Yellow Eyes. Respiratory Present- Snoring. Not Present- Bloody sputum, Chronic Cough, Difficulty Breathing and Wheezing. Breast Present- Breast Mass and Breast Pain. Not Present- Nipple Discharge and Skin Changes. Cardiovascular Present- Leg Cramps. Not Present- Chest Pain, Difficulty Breathing Lying Down, Palpitations, Rapid Heart Rate, Shortness of Breath and Swelling of Extremities. Gastrointestinal Not Present- Abdominal Pain, Bloating, Bloody Stool, Change in Bowel Habits, Chronic diarrhea, Constipation, Difficulty Swallowing, Excessive gas, Gets full quickly at meals, Hemorrhoids, Indigestion,  Nausea, Rectal Pain and Vomiting. Female Genitourinary Not Present- Frequency, Nocturia, Painful Urination, Pelvic Pain and Urgency. Musculoskeletal Not Present- Back Pain, Joint Pain, Joint Stiffness, Muscle Pain, Muscle Weakness and Swelling of Extremities. Neurological Not Present- Decreased Memory, Fainting, Headaches, Numbness, Seizures, Tingling, Tremor, Trouble walking and Weakness. Endocrine Present- Heat Intolerance. Not Present- Cold Intolerance, Excessive Hunger, Hair Changes, Hot flashes and New Diabetes. Hematology Not Present- Easy Bruising, Excessive bleeding, Gland problems, HIV and Persistent Infections.  Vitals  Weight: 218.2 lb Height: 67in Body Surface Area: 2.1 m Body Mass Index: 34.17 kg/m  Temp.: 97.1F(Temporal)  Pulse: 103 (Regular)        Physical Exam General Mental Status-Alert. General Appearance-Consistent with stated age. Hydration-Well hydrated. Voice-Normal.  Head and Neck Head-normocephalic, atraumatic with no lesions or palpable masses. Trachea-midline. Thyroid Gland Characteristics - normal size and consistency.  Eye Eyeball - Bilateral-Extraocular movements intact. Sclera/Conjunctiva - Bilateral-No scleral icterus.  Chest and Lung Exam Chest and lung exam reveals -quiet, even and easy respiratory effort with no use of accessory muscles and on auscultation, normal breath sounds, no adventitious sounds and normal vocal resonance. Inspection Chest Wall - Normal. Back - normal.  Breast Note: The only palpable mass in the right breast is the fibroadenoma in the upper outer quadrant which measures about 2 cm. There is no other palpable mass in either breast. There is no palpable axillary, supraclavicular, or cervical lymphadenopathy.   Cardiovascular Cardiovascular examination reveals -normal heart sounds, regular rate and rhythm with no murmurs and normal pedal pulses  bilaterally.  Abdomen Inspection Inspection of the abdomen reveals - No Hernias. Skin - Scar - no surgical scars. Palpation/Percussion Palpation and Percussion of the abdomen reveal - Soft, Non Tender, No Rebound tenderness, No Rigidity (guarding) and No hepatosplenomegaly. Auscultation Auscultation of the abdomen reveals - Bowel sounds normal.  Neurologic Neurologic evaluation reveals -alert and oriented  x 3 with no impairment of recent or remote memory. Mental Status-Normal.  Musculoskeletal Normal Exam - Left-Upper Extremity Strength Normal and Lower Extremity Strength Normal. Normal Exam - Right-Upper Extremity Strength Normal and Lower Extremity Strength Normal.  Lymphatic Head & Neck  General Head & Neck Lymphatics: Bilateral - Description - Normal. Axillary  General Axillary Region: Bilateral - Description - Normal. Tenderness - Non Tender. Femoral & Inguinal  Generalized Femoral & Inguinal Lymphatics: Bilateral - Description - Normal. Tenderness - Non Tender.    Assessment & Plan  BREAST CANCER OF LOWER-INNER QUADRANT OF RIGHT FEMALE BREAST (C50.311) Impression: The patient had a large central right breast cancer that responded well to neoadjuvant chemotherapy. She has had a complete radiographic response. At this point she is ready to schedule her definitive surgery. She favors breast conservation. Her nodes have  been clinically been negative. I would plan for a right breast radioactive seed localized lumpectomy and sentinel node mapping. 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    Signed by Luella Cook, MD

## 2015-11-09 NOTE — Anesthesia Postprocedure Evaluation (Signed)
Anesthesia Post Note  Patient: Phyllis Wiggins  Procedure(s) Performed: Procedure(s) (LRB): RIGHT BREAST LUMPECTOMY WITH RADIOACTIVE SEED AND SENTINEL LYMPH NODE BIOPSY AND EXCISION OF RIGHT BREAST FIBROADENOMA (Right)  Patient location during evaluation: PACU Anesthesia Type: General and Regional Level of consciousness: awake and alert Pain management: pain level controlled Vital Signs Assessment: post-procedure vital signs reviewed and stable Respiratory status: spontaneous breathing, nonlabored ventilation, respiratory function stable and patient connected to nasal cannula oxygen Cardiovascular status: blood pressure returned to baseline and stable Postop Assessment: no signs of nausea or vomiting Anesthetic complications: no    Last Vitals:  Filed Vitals:   11/09/15 1045 11/09/15 1100  BP: 112/70 117/83  Pulse: 74 81  Temp:    Resp: 15 15    Last Pain:  Filed Vitals:   11/09/15 1109  PainSc: 4                  Saw Mendenhall,W. EDMOND

## 2015-11-09 NOTE — Transfer of Care (Signed)
Immediate Anesthesia Transfer of Care Note  Patient: Phyllis Wiggins  Procedure(s) Performed: Procedure(s): RIGHT BREAST LUMPECTOMY WITH RADIOACTIVE SEED AND SENTINEL LYMPH NODE BIOPSY AND EXCISION OF RIGHT BREAST FIBROADENOMA (Right)  Patient Location: PACU  Anesthesia Type:GA combined with regional for post-op pain  Level of Consciousness: awake and patient cooperative  Airway & Oxygen Therapy: Patient Spontanous Breathing and Patient connected to face mask oxygen  Post-op Assessment: Report given to RN and Post -op Vital signs reviewed and stable  Post vital signs: Reviewed and stable  Last Vitals:  Filed Vitals:   11/09/15 1030 11/09/15 1031  BP:  110/70  Pulse: 92 87  Temp:    Resp:  21    Complications: No apparent anesthesia complications

## 2015-11-09 NOTE — Progress Notes (Signed)
Assisted Dr. Singer with right, ultrasound guided, pectoralis block. Side rails up, monitors on throughout procedure. See vital signs in flow sheet. Tolerated Procedure well. °

## 2015-11-09 NOTE — Interval H&P Note (Signed)
History and Physical Interval Note:  11/09/2015 8:23 AM  Phyllis Wiggins  has presented today for surgery, with the diagnosis of RIGHT BREAST CANCER AND RIGHT BREAST FIBROADENOMA  The various methods of treatment have been discussed with the patient and family. After consideration of risks, benefits and other options for treatment, the patient has consented to  Procedure(s): RIGHT BREAST LUMPECTOMY WITH RADIOACTIVE SEED AND SENTINEL LYMPH NODE BIOPSY AND EXCISION OF RIGHT BREAST FIBROADENOMA (Right) as a surgical intervention .  The patient's history has been reviewed, patient examined, no change in status, stable for surgery.  I have reviewed the patient's chart and labs.  Questions were answered to the patient's satisfaction.     TOTH III,PAUL S

## 2015-11-10 ENCOUNTER — Encounter (HOSPITAL_BASED_OUTPATIENT_CLINIC_OR_DEPARTMENT_OTHER): Payer: Self-pay | Admitting: General Surgery

## 2015-11-13 ENCOUNTER — Other Ambulatory Visit: Payer: Self-pay | Admitting: Oncology

## 2015-11-14 ENCOUNTER — Encounter: Payer: Self-pay | Admitting: Radiation Oncology

## 2015-11-14 ENCOUNTER — Ambulatory Visit (HOSPITAL_BASED_OUTPATIENT_CLINIC_OR_DEPARTMENT_OTHER): Payer: Medicaid Other

## 2015-11-14 ENCOUNTER — Encounter: Payer: Self-pay | Admitting: *Deleted

## 2015-11-14 ENCOUNTER — Other Ambulatory Visit (HOSPITAL_BASED_OUTPATIENT_CLINIC_OR_DEPARTMENT_OTHER): Payer: Medicaid Other

## 2015-11-14 VITALS — BP 122/67 | HR 101 | Temp 97.8°F | Resp 18

## 2015-11-14 DIAGNOSIS — Z5112 Encounter for antineoplastic immunotherapy: Secondary | ICD-10-CM | POA: Diagnosis present

## 2015-11-14 DIAGNOSIS — C50511 Malignant neoplasm of lower-outer quadrant of right female breast: Secondary | ICD-10-CM | POA: Diagnosis not present

## 2015-11-14 DIAGNOSIS — C50919 Malignant neoplasm of unspecified site of unspecified female breast: Secondary | ICD-10-CM

## 2015-11-14 LAB — CBC WITH DIFFERENTIAL/PLATELET
BASO%: 0.4 % (ref 0.0–2.0)
BASOS ABS: 0 10*3/uL (ref 0.0–0.1)
EOS%: 2.3 % (ref 0.0–7.0)
Eosinophils Absolute: 0.2 10*3/uL (ref 0.0–0.5)
HEMATOCRIT: 37.7 % (ref 34.8–46.6)
HGB: 12.7 g/dL (ref 11.6–15.9)
LYMPH#: 2.4 10*3/uL (ref 0.9–3.3)
LYMPH%: 29.9 % (ref 14.0–49.7)
MCH: 33.1 pg (ref 25.1–34.0)
MCHC: 33.8 g/dL (ref 31.5–36.0)
MCV: 98.1 fL (ref 79.5–101.0)
MONO#: 0.4 10*3/uL (ref 0.1–0.9)
MONO%: 4.9 % (ref 0.0–14.0)
NEUT#: 4.9 10*3/uL (ref 1.5–6.5)
NEUT%: 62.5 % (ref 38.4–76.8)
Platelets: 261 10*3/uL (ref 145–400)
RBC: 3.84 10*6/uL (ref 3.70–5.45)
RDW: 13.1 % (ref 11.2–14.5)
WBC: 7.9 10*3/uL (ref 3.9–10.3)

## 2015-11-14 LAB — COMPREHENSIVE METABOLIC PANEL
ALT: 16 U/L (ref 0–55)
AST: 18 U/L (ref 5–34)
Albumin: 3.4 g/dL — ABNORMAL LOW (ref 3.5–5.0)
Alkaline Phosphatase: 49 U/L (ref 40–150)
Anion Gap: 12 mEq/L — ABNORMAL HIGH (ref 3–11)
BUN: 9.6 mg/dL (ref 7.0–26.0)
CALCIUM: 10 mg/dL (ref 8.4–10.4)
CHLORIDE: 105 meq/L (ref 98–109)
CO2: 22 meq/L (ref 22–29)
CREATININE: 0.9 mg/dL (ref 0.6–1.1)
EGFR: 78 mL/min/{1.73_m2} — ABNORMAL LOW (ref >90)
GLUCOSE: 197 mg/dL — AB (ref 70–140)
POTASSIUM: 4.5 meq/L (ref 3.5–5.1)
SODIUM: 139 meq/L (ref 136–145)
Total Bilirubin: 0.33 mg/dL (ref 0.20–1.20)
Total Protein: 7.1 g/dL (ref 6.4–8.3)

## 2015-11-14 MED ORDER — TRASTUZUMAB CHEMO INJECTION 440 MG
6.0000 mg/kg | Freq: Once | INTRAVENOUS | Status: AC
  Administered 2015-11-14: 567 mg via INTRAVENOUS
  Filled 2015-11-14: qty 27

## 2015-11-14 MED ORDER — DIPHENHYDRAMINE HCL 25 MG PO CAPS
25.0000 mg | ORAL_CAPSULE | Freq: Once | ORAL | Status: AC
  Administered 2015-11-14: 25 mg via ORAL

## 2015-11-14 MED ORDER — ACETAMINOPHEN 325 MG PO TABS: 650.0000 mg | ORAL_TABLET | Freq: Once | ORAL | Status: DC

## 2015-11-14 MED ORDER — HEPARIN SOD (PORK) LOCK FLUSH 100 UNIT/ML IV SOLN
500.0000 [IU] | Freq: Once | INTRAVENOUS | Status: AC | PRN
  Administered 2015-11-14: 500 [IU]
  Filled 2015-11-14: qty 5

## 2015-11-14 MED ORDER — DIPHENHYDRAMINE HCL 25 MG PO CAPS
ORAL_CAPSULE | ORAL | Status: AC
  Filled 2015-11-14: qty 1

## 2015-11-14 MED ORDER — SODIUM CHLORIDE 0.9 % IV SOLN
Freq: Once | INTRAVENOUS | Status: AC
  Administered 2015-11-14: 10:00:00 via INTRAVENOUS

## 2015-11-14 MED ORDER — SODIUM CHLORIDE 0.9% FLUSH
10.0000 mL | INTRAVENOUS | Status: DC | PRN
  Administered 2015-11-14: 10 mL
  Filled 2015-11-14: qty 10

## 2015-11-14 NOTE — Patient Instructions (Signed)
[facility] Discharge Instructions for Patients Receiving Chemotherapy  Today you received the following chemotherapy agents Herceptin/Perjeta  To help prevent nausea and vomiting after your treatment, we encourage you to take your nausea medication as directed by your MD.  If you develop nausea and vomiting that is not controlled by your nausea medication, call the clinic.   BELOW ARE SYMPTOMS THAT SHOULD BE REPORTED IMMEDIATELY:  *FEVER GREATER THAN 100.5 F  *CHILLS WITH OR WITHOUT FEVER  NAUSEA AND VOMITING THAT IS NOT CONTROLLED WITH YOUR NAUSEA MEDICATION  *UNUSUAL SHORTNESS OF BREATH  *UNUSUAL BRUISING OR BLEEDING  TENDERNESS IN MOUTH AND THROAT WITH OR WITHOUT PRESENCE OF ULCERS  *URINARY PROBLEMS  *BOWEL PROBLEMS  UNUSUAL RASH Items with * indicate a potential emergency and should be followed up as soon as possible.  Feel free to call the clinic you have any questions or concerns. The clinic phone number is [phone].  Please show the [facility] at check-in to the Emergency Department and triage nurse.    Trastuzumab injection for infusion What is this medicine? TRASTUZUMAB (tras TOO zoo mab) is a monoclonal antibody. It is used to treat breast cancer and stomach cancer. This medicine may be used for other purposes; ask your health care provider or pharmacist if you have questions. What should I tell my health care provider before I take this medicine? They need to know if you have any of these conditions: -heart disease -heart failure -infection (especially a virus infection such as chickenpox, cold sores, or herpes) -lung or breathing disease, like asthma -recent or ongoing radiation therapy -an unusual or allergic reaction to trastuzumab, benzyl alcohol, or other medications, foods, dyes, or preservatives -pregnant or trying to get pregnant -breast-feeding How should I use this medicine? This drug is given as an infusion into a  vein. It is administered in a hospital or clinic by a specially trained health care professional. Talk to your pediatrician regarding the use of this medicine in children. This medicine is not approved for use in children. Overdosage: If you think you have taken too much of this medicine contact a poison control center or emergency room at once. NOTE: This medicine is only for you. Do not share this medicine with others. What if I miss a dose? It is important not to miss a dose. Call your doctor or health care professional if you are unable to keep an appointment. What may interact with this medicine? -doxorubicin -warfarin This list may not describe all possible interactions. Give your health care provider a list of all the medicines, herbs, non-prescription drugs, or dietary supplements you use. Also tell them if you smoke, drink alcohol, or use illegal drugs. Some items may interact with your medicine. What should I watch for while using this medicine? Visit your doctor for checks on your progress. Report any side effects. Continue your course of treatment even though you feel ill unless your doctor tells you to stop. Call your doctor or health care professional for advice if you get a fever, chills or sore throat, or other symptoms of a cold or flu. Do not treat yourself. Try to avoid being around people who are sick. You may experience fever, chills and shaking during your first infusion. These effects are usually mild and can be treated with other medicines. Report any side effects during the infusion to your health care professional. Fever and chills usually do not happen with later infusions. Do not become pregnant while taking  this medicine or for 7 months after stopping it. Women should inform their doctor if they wish to become pregnant or think they might be pregnant. Women of child-bearing potential will need to have a negative pregnancy test before starting this medicine. There is a  potential for serious side effects to an unborn child. Talk to your health care professional or pharmacist for more information. Do not breast-feed an infant while taking this medicine or for 7 months after stopping it. Women must use effective birth control with this medicine. What side effects may I notice from receiving this medicine? Side effects that you should report to your doctor or other health care professional as soon as possible: -breathing difficulties -chest pain or palpitations -cough -dizziness or fainting -fever or chills, sore throat -skin rash, itching or hives -swelling of the legs or ankles -unusually weak or tired Side effects that usually do not require medical attention (report to your doctor or other health care professional if they continue or are bothersome): -loss of appetite -headache -muscle aches -nausea This list may not describe all possible side effects. Call your doctor for medical advice about side effects. You may report side effects to FDA at 1-800-FDA-1088. Where should I keep my medicine? This drug is given in a hospital or clinic and will not be stored at home. NOTE: This sheet is a summary. It may not cover all possible information. If you have questions about this medicine, talk to your doctor, pharmacist, or health care provider.    2016, [employer]vier/Gold Standard. (2014-10-28 11:49:32)

## 2015-11-24 NOTE — Progress Notes (Deleted)
Location of Breast Cancer:Breast cancer of lower-outer right female breast  Histology per Pathology Report:  Diagnosis  11-09-15 1. Lymph node, sentinel, biopsy, Right Axillary #1 - ONE BENIGN LYMPH NODE (0/1). 2. Lymph node, sentinel, biopsy, Right Axillary #2 - ONE BENIGN LYMPH NODE (0/1). 3. Lymph node, sentinel, biopsy, Right Axillary #3 - ONE BENIGN LYMPH NODE (0/1). 4. Breast, lumpectomy, Right Breast Tissue w/seed - FIBROSIS AND MILD CHRONIC INFLAMMATION. - NO RESIDUAL TUMOR IDENTIFIED. - BIOPSY SITE REACTION. - FIBROCYSTIC CHANGES WITH CALCIFICATIONS. - MARGINS CLEAR. 5. Breast, excision, Superior Margin Right Breast - FIBROCYSTIC CHANGES. - NO EVIDENCE OF MALIGNANCY. - MARGINS CLEAR. 6. Breast, excision, Right Breast Medial Margin - FIBROCYSTIC CHANGES WITH FOCAL USUAL DUCTAL HYPERPLASIA. - NO EVIDENCE OF MALIGNANCY. - MARGINS CLEAR. 7. Breast, biopsy, Right Breast Fibroadenoma - FIBROADENOMA. - NO EVIDENCE OF MALIGNANCY. Receptor Status: ER(0%), PR (0%), Her2-neu (+), Ki-(40%) Did patient present with symptoms (if so, please note symptoms) or was this found on screening mammography?: Screening exam for pap smear exam showed lump in right breast.  Past/Anticipated interventions by surgeon, if any:11-09-15 Biopsy right breast,  Past/Anticipated interventions by medical oncology, if any:MRI,  trastuzumab to be continued to complete a year, adjuvant radiation to follow surgery Lymphedema issues, if any:  Pain issues, if any:    SAFETY ISSUES:  Prior radiation?:  Pacemaker/ICD? :  Possible current pregnancy?:  Is the patient on methotrexate?:  Current Complaints / other details:  Menarche, G,P, BC   Georgena Spurling, RN 11/24/2015,3:57 PM

## 2015-11-29 NOTE — Progress Notes (Signed)
Location of Breast Cancer:Breast cancer of lower-outer right female breast  Histology per Pathology Report:  Diagnosis 11-09-15  1. Lymph node, sentinel, biopsy, Right Axillary #1  - ONE BENIGN LYMPH NODE (0/1).  2. Lymph node, sentinel, biopsy, Right Axillary #2  - ONE BENIGN LYMPH NODE (0/1).  3. Lymph node, sentinel, biopsy, Right Axillary #3  - ONE BENIGN LYMPH NODE (0/1).  4. Breast, lumpectomy, Right Breast Tissue w/seed  - FIBROSIS AND MILD CHRONIC INFLAMMATION.  - NO RESIDUAL TUMOR IDENTIFIED.  - BIOPSY SITE REACTION.  - FIBROCYSTIC CHANGES WITH CALCIFICATIONS.  - MARGINS CLEAR.  5. Breast, excision, Superior Margin Right Breast  - FIBROCYSTIC CHANGES.  - NO EVIDENCE OF MALIGNANCY.  - MARGINS CLEAR.  6. Breast, excision, Right Breast Medial Margin  - FIBROCYSTIC CHANGES WITH FOCAL USUAL DUCTAL HYPERPLASIA.  - NO EVIDENCE OF MALIGNANCY.  - MARGINS CLEAR.  7. Breast, biopsy, Right Breast Fibroadenoma  - FIBROADENOMA.  - NO EVIDENCE OF MALIGNANCY.  Receptor Status: ER(0%), PR (0%), Her2-neu (+), Ki-(40%)  Did patient present with symptoms (if so, please note symptoms) or was this found on screening mammography?: Ms. Viruet found  lump in right breast in June 2016. Past/Anticipated interventions by surgeon, if any:11-09-15 Biopsy right breast,  Past/Anticipated interventions by medical oncology, if any:MRI, trastuzumab to be continued to complete a year, adjuvant radiation to follow surgery  Lymphedema issues, if any: No Pain issues, if any: 2/10 not taking pain medication,using essential oils. SAFETY ISSUES:  Prior radiation?: No Pacemaker/ICD? : No Possible current pregnancy?: No Is the patient on methotrexate?:No Current Complaints / other details: Stopped Decadron 2 weeks ago after surgery.  No soreness of mouth. Here to discuss radiation therapy Menarche 8, G1,P0, BC 11 years, Menopause 08-2015 HRT No  BP 112/69 mmHg  Pulse 109  Temp(Src) 98.7 F (37.1 C) (Oral)  Resp  18  Ht 5' 7" (1.702 m)  Wt 216 lb (97.977 kg)  BMI 33.82 kg/m2  SpO2 98%  LMP 08/12/2015 Georgena Spurling, RN  11/24/2015,3:57 PM

## 2015-11-30 ENCOUNTER — Ambulatory Visit
Admission: RE | Admit: 2015-11-30 | Discharge: 2015-11-30 | Disposition: A | Discharge status: Home / Self Care | Payer: Medicaid Other | Source: Ambulatory Visit | Attending: Radiation Oncology | Admitting: Radiation Oncology

## 2015-11-30 ENCOUNTER — Encounter: Payer: Self-pay | Admitting: Physical Therapy

## 2015-11-30 ENCOUNTER — Encounter: Payer: Self-pay | Admitting: Radiation Oncology

## 2015-11-30 ENCOUNTER — Ambulatory Visit: Payer: Medicaid Other | Attending: General Surgery | Admitting: Physical Therapy

## 2015-11-30 VITALS — BP 112/69 | HR 109 | Temp 98.7°F | Resp 18 | Ht 67.0 in | Wt 216.0 lb

## 2015-11-30 DIAGNOSIS — Z483 Aftercare following surgery for neoplasm: Secondary | ICD-10-CM | POA: Insufficient documentation

## 2015-11-30 DIAGNOSIS — C50511 Malignant neoplasm of lower-outer quadrant of right female breast: Secondary | ICD-10-CM

## 2015-11-30 DIAGNOSIS — Z510 Encounter for antineoplastic radiation therapy: Secondary | ICD-10-CM | POA: Diagnosis present

## 2015-11-30 NOTE — Therapy (Signed)
[place], [place], [address] Phone: [phone]   Fax:  [phone]  Physical Therapy Evaluation  Patient Details  Name: Phyllis Wiggins MRN: [id number] Date of Birth: Aug 09, 1970 Referring Provider: [name]  Encounter Date: 11/30/2015      PT End of Session - 11/30/15 1704    Visit Number 1   Number of Visits 1   Authorization Type Medicaid   PT Start Time [id number]   PT Stop Time 1514   PT Time Calculation (min) 41 min   Activity Tolerance Patient tolerated treatment well   Behavior During Therapy [facility] for tasks assessed/performed      Past Medical History  Diagnosis Date  . Breast cancer of lower-outer quadrant of right female breast ([place]) 06/20/2015  . Breast cancer ([place])   . Arthritis   . Family history of stomach cancer     Past Surgical History  Procedure Laterality Date  . Wisdom tooth extraction    . Portacath placement Left 07/06/2015    Procedure: INSERTION PORT-A-CATH;  Surgeon: Autumn Messing III, MD;  Location: [facility];  Service: General;  Laterality: Left;  . Breast lumpectomy with radioactive seed and sentinel lymph node biopsy Right 11/09/2015    Procedure: RIGHT BREAST LUMPECTOMY WITH RADIOACTIVE SEED AND SENTINEL LYMPH NODE BIOPSY AND EXCISION OF RIGHT BREAST FIBROADENOMA;  Surgeon: Autumn Messing III, MD;  Location: [facility];  Service: General;  Laterality: Right;    There were no vitals filed for this visit.       Subjective Assessment - 11/30/15 1440    Subjective I have this band in my right armpit. It is very sensitive especially if you touch it. I am afraid if I do not do anything about it my range of motion will be affected.    Pertinent History The patient is a 45 year old female who presents for breast cancer follow up. She was initially diagnosed with a 2 cm cancer in the lower inner subareolar right breast. She has been receiving neoadjuvant chemotherapy of  carboplatin, docetaxel, pertuzumab, and trastuzumab. She struggled to finish the treatment course and discontinued carboplatin/docetaxel after 5 cycles. On 10/18/15 breast MRI, there was complete resolution of the enhancing known malignancy in the lower-inner quadrant of the right breast. On 11/09/15, she underwent right breast lumpectomy with Dr.Toth. Final pathology indicated that no residual tumor was identified, 0/3 sentinel lymph nodes positive, margins are clear with fibrocystic changes. The patient has been recovering well from surgery. She stopped Decadron 2 weeks ago after surgery.She complains of some residual surgical discomfort due to removal fibrocystic adenoma. Pt to begin radiation in two weeks.    Patient Stated Goals break up the scar tissue and maintain ROM   Currently in Pain? No/denies            [facility] PT Assessment - 11/30/15 0001    Assessment   Medical Diagnosis right breast cancer   Referring Provider [name]   Onset Date/Surgical Date 11/09/15   Hand Dominance Right   Prior Therapy years ago for bilateral carpal tunnel   Precautions   Precautions Other (comment)  at risk for developing lymphedema   Restrictions   Weight Bearing Restrictions No   Balance Screen   Has the patient fallen in the past 6 months Yes   How many times? 1  states it happened when on chemo at first because the drugs    Has the patient had a decrease in activity level  because of a fear of falling?  No   Is the patient reluctant to leave their home because of a fear of falling?  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   Leisure walking about 20 min 3-4 x/wk, just started last wk was too sick during chemo   Cognition   Overall Cognitive Status Within Functional Limits for tasks assessed   AROM   Overall AROM  Within functional limits for tasks performed   Strength   Overall Strength Within functional limits for tasks performed  5/5 throughout UE           LYMPHEDEMA/ONCOLOGY QUESTIONNAIRE - 11/30/15 1447    Type   Cancer Type Right breast cancer   Surgeries   Lumpectomy Date 11/09/15   Sentinel Lymph Node Biopsy Date 11/09/15   Number Lymph Nodes Removed 3   Treatment   Active Chemotherapy Treatment No   Past Chemotherapy Treatment Yes   Date 11/04/15   Active Radiation Treatment No  to begin in 2 weeks   Past Radiation Treatment No   Current Hormone Treatment No   Past Hormone Therapy No   What other symptoms do you have   Are you Having Heaviness or Tightness Yes  in right breast, feels it may be hormonal fluctuations   Are you having Pain No  occasional pain at the site of the fibroadenoma   Are you having pitting edema No   Is it Hard or Difficult finding clothes that fit No   Do you have infections No   Is there Decreased scar mobility No   Right Upper Extremity Lymphedema   15 cm Proximal to Olecranon Process 34.7 cm   Olecranon Process 27 cm   15 cm Proximal to Ulnar Styloid Process 27.2 cm   Just Proximal to Ulnar Styloid Process 15.9 cm   Across Hand at [employer] 19 cm   At [place] of 2nd Digit 5.7 cm   Left Upper Extremity Lymphedema   15 cm Proximal to Olecranon Process 35.2 cm   Olecranon Process 27.7 cm   15 cm Proximal to Ulnar Styloid Process 27 cm   Just Proximal to Ulnar Styloid Process 16 cm   Across Hand at [employer] 18.5 cm   At [place] of 2nd Digit 5.8 cm           Quick Dash - 11/30/15 0001    Open a tight or new jar Mild difficulty    Do heavy household chores (wash walls, wash floors) Moderate difficulty   Carry a shopping bag or briefcase Mild difficulty   Wash your back Moderate difficulty   Use a knife to cut food Mild difficulty   Recreational activities in which you take some force or impact through your arm, shoulder, or hand (golf, hammering, tennis) Severe difficulty   During the past week, to what extent has your arm, shoulder or hand problem interfered with your normal social activities with  family, friends, neighbors, or groups? Slightly   During the past week, to what extent has your arm, shoulder or hand problem limited your work or other regular daily activities Modererately   Arm, shoulder, or hand pain. Moderate   Tingling (pins and needles) in your arm, shoulder, or hand None   Difficulty Sleeping Moderate difficulty   DASH Score 38.64 %                     PT Education - 11/30/15 1722    Education provided Yes   Education Details do end range of motion exercises to stretch right axilla, lymphedema risk reduction practices   Person(s) Educated Patient   Methods Explanation;Handout   Comprehension Verbalized understanding                    Plan - 11/30/15 1705    Clinical Impression Statement Pt with history of right lumpectomy and sentinel lymph node biopsy on 11/09/15. Since then patient has noticed a band of cording in right axilla. Pt has full range of motion of RUE and her strength is 5/5 throughout. The cording is not causing any decrease in function of her right UE. Upon palpation it feels the cord is mainly concentrated in the right axilla and has a very bumpy texture. It could not be traced down the arm. The cord most likely has already "popped," which is why patient is not having any decrease in function. She does feel like she remembers pushing up with her arm early after surgery and feeling a pop in her R axilla. Pt was educated on lymphedema risk reduction practices and  also importance of moving arm to end range of motion to keep from getting tight especially during radiation. This will be a one time visit.    PT Frequency One time visit   PT Treatment/Interventions ADLs/Self Care Home Management   PT Home Exercise Plan moving arm to end range of motion   Consulted and Agree with Plan of Care Patient      Patient will benefit from skilled therapeutic intervention in order to improve the following deficits and impairments:  Decreased knowledge of precautions  Visit Diagnosis: Aftercare following surgery for neoplasm - Plan: PT plan of care cert/re-cert     Problem List Patient Active Problem List   Diagnosis Date Noted  . 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 Chemotherapy induced nausea and vomiting 08/29/2015  . Diarrhea 08/29/2015  . Genetic testing 08/17/2015  . Family history of stomach cancer   . Breast cancer of lower-outer quadrant of right female breast ([place]) 06/20/2015    Alexia Freestone 11/30/2015, 5:25 PM  [place], [place], [address] Phone: [phone]   Fax:  [phone]  Name: Phyllis Wiggins MRN: [id number] Date of Birth: 02-20-1971   Allyson Sabal, PT 11/30/2015 5:25 PM

## 2015-11-30 NOTE — Progress Notes (Signed)
   Department of Radiation Oncology  Phone:  [phone] Fax:        [phone]   Name: Phyllis Wiggins MRN: [id number]  DOB: [birth year]  Date: 11/30/2015  Follow Up Visit Note  Diagnosis: Breast cancer of lower-outer quadrant of right female breast [facility])   Staging form: Breast, AJCC 7th Edition     Clinical: Stage IA (T1c, N0, M0) - Signed by Chauncey Cruel, MD on 06/28/2015  Interval History:  The patient is a 45 year old female who presents for breast cancer follow up. She was initially diagnosed with a 2 cm cancer in the lower inner subareolar right breast. She has been receiving neoadjuvant chemotherapy of carboplatin, docetaxel, pertuzumab, and trastuzumab. She struggled to finish the treatment course and discontinued carboplatin/docetaxel after 5 cycles. On 10/18/15 breast MRI, there was complete resolution of the enhancing known malignancy in the lower-inner quadrant of the right breast.    On 11/09/15, she underwent right breast lumpectomy with Dr.Toth. Final pathology indicated that no residual tumor was identified, 0/3 sentinel lymph nodes positive, margins are clear with fibrocystic changes.   The patient has been recovering well from surgery. She stopped Decadron 2 weeks ago after surgery.She complains of some residual surgical discomfort due to removal fibrocystic adenoma.  She presents today to discuss adjuvant radiation treatment in the management of her disease.  Physical Exam:  Filed Vitals:   11/30/15 0953  BP: 112/69  Pulse: 109  Temp: 98.7 F (37.1 C)  TempSrc: Oral  Resp: 18  Height: 5\' 7"  (1.702 m)  Weight: 216 lb (97.977 kg)  SpO2: 98%  No palpable cervical, supraclavicular or axillary lymphoadenopathy. The heart has a regular rate and rhythm. The lungs are clear to auscultation.  Incision around areola is healing well. Sentinel LN in right axilla is healing well. No signs of infection.  IMPRESSION: Ase is a 45 y.o. female who is diagnosed with  right breast cancer with a complete pathologic response. The patient is appropriate to proceed with adjuvant radiation treatment.   PLAN:  We discussed the role of radiation and decreasing local failures in patients who undergo lumpectomy. We discussed the retrospective data showing an increase in failure rates in patients who have a pathologic complete response and did not undergo radiation. For this reason I have recommended radiation to the whole breast followed by boost to the tumor bed. We discussed the process of simulation the placement tattoos. We discussed possible side effects during treatment including but not limited to skin irritation darkness and fatigue. We discussed long-term effects of treatment which are extremely unlikely but possible including damage to the lungs and ribs. We discussed the low likelihood of secondary malignancies. The patient has signed informed consent.  CT simulation scheduled for May 4th 9 am.   I anticipate a 6 week course of radiation treatment.   ------------------------------------------------  [name], MD  This document serves as a record of services personally performed by [name], MD. It was created on her behalf by Derek Mound, a trained medical scribe. The creation of this record is based on the scribe's personal observations and the provider's statements to them. This document has been checked and approved by the attending provider.

## 2015-12-01 NOTE — Addendum Note (Signed)
Encounter addended by: Malena Edman, RN on: 12/01/2015 11:15 AM<BR>     Documentation filed: Charges VN

## 2015-12-06 ENCOUNTER — Other Ambulatory Visit (HOSPITAL_BASED_OUTPATIENT_CLINIC_OR_DEPARTMENT_OTHER): Payer: Medicaid Other

## 2015-12-06 ENCOUNTER — Ambulatory Visit (HOSPITAL_BASED_OUTPATIENT_CLINIC_OR_DEPARTMENT_OTHER): Payer: Medicaid Other | Admitting: Oncology

## 2015-12-06 ENCOUNTER — Telehealth: Payer: Self-pay | Admitting: Oncology

## 2015-12-06 ENCOUNTER — Ambulatory Visit (HOSPITAL_BASED_OUTPATIENT_CLINIC_OR_DEPARTMENT_OTHER): Payer: Medicaid Other

## 2015-12-06 VITALS — BP 109/68 | HR 96 | Temp 98.2°F | Resp 18 | Ht 67.0 in | Wt 216.9 lb

## 2015-12-06 DIAGNOSIS — C50919 Malignant neoplasm of unspecified site of unspecified female breast: Secondary | ICD-10-CM

## 2015-12-06 DIAGNOSIS — C50511 Malignant neoplasm of lower-outer quadrant of right female breast: Secondary | ICD-10-CM

## 2015-12-06 DIAGNOSIS — Z171 Estrogen receptor negative status [ER-]: Secondary | ICD-10-CM

## 2015-12-06 DIAGNOSIS — Z5112 Encounter for antineoplastic immunotherapy: Secondary | ICD-10-CM

## 2015-12-06 DIAGNOSIS — I899 Noninfective disorder of lymphatic vessels and lymph nodes, unspecified: Secondary | ICD-10-CM

## 2015-12-06 LAB — COMPREHENSIVE METABOLIC PANEL
ALT: 18 U/L (ref 0–55)
ANION GAP: 11 meq/L (ref 3–11)
AST: 14 U/L (ref 5–34)
Albumin: 3.7 g/dL (ref 3.5–5.0)
Alkaline Phosphatase: 57 U/L (ref 40–150)
BUN: 13 mg/dL (ref 7.0–26.0)
CHLORIDE: 106 meq/L (ref 98–109)
CO2: 25 meq/L (ref 22–29)
Calcium: 9.9 mg/dL (ref 8.4–10.4)
Creatinine: 0.9 mg/dL (ref 0.6–1.1)
EGFR: 75 mL/min/{1.73_m2} — AB (ref >90)
Glucose: 134 mg/dl (ref 70–140)
Potassium: 4.1 mEq/L (ref 3.5–5.1)
Sodium: 142 mEq/L (ref 136–145)
Total Bilirubin: 0.43 mg/dL (ref 0.20–1.20)
Total Protein: 7.1 g/dL (ref 6.4–8.3)

## 2015-12-06 LAB — CBC WITH DIFFERENTIAL/PLATELET
BASO%: 0.2 % (ref 0.0–2.0)
Basophils Absolute: 0 10*3/uL (ref 0.0–0.1)
EOS%: 2.8 % (ref 0.0–7.0)
Eosinophils Absolute: 0.2 10*3/uL (ref 0.0–0.5)
HCT: 37.5 % (ref 34.8–46.6)
HGB: 12.7 g/dL (ref 11.6–15.9)
LYMPH%: 37.4 % (ref 14.0–49.7)
MCH: 32 pg (ref 25.1–34.0)
MCHC: 33.9 g/dL (ref 31.5–36.0)
MCV: 94.5 fL (ref 79.5–101.0)
MONO#: 0.3 10*3/uL (ref 0.1–0.9)
MONO%: 4.1 % (ref 0.0–14.0)
NEUT#: 3.5 10*3/uL (ref 1.5–6.5)
NEUT%: 55.5 % (ref 38.4–76.8)
PLATELETS: 221 10*3/uL (ref 145–400)
RBC: 3.97 10*6/uL (ref 3.70–5.45)
RDW: 11.8 % (ref 11.2–14.5)
WBC: 6.4 10*3/uL (ref 3.9–10.3)
lymph#: 2.4 10*3/uL (ref 0.9–3.3)

## 2015-12-06 MED ORDER — ACETAMINOPHEN 325 MG PO TABS
ORAL_TABLET | ORAL | Status: AC
  Filled 2015-12-06: qty 2

## 2015-12-06 MED ORDER — SODIUM CHLORIDE 0.9% FLUSH
10.0000 mL | INTRAVENOUS | Status: DC | PRN
  Administered 2015-12-06: 10 mL
  Filled 2015-12-06: qty 10

## 2015-12-06 MED ORDER — SODIUM CHLORIDE 0.9 % IV SOLN
Freq: Once | INTRAVENOUS | Status: AC
  Administered 2015-12-06: 10:00:00 via INTRAVENOUS

## 2015-12-06 MED ORDER — DIPHENHYDRAMINE HCL 25 MG PO CAPS
25.0000 mg | ORAL_CAPSULE | Freq: Once | ORAL | Status: AC
  Administered 2015-12-06: 25 mg via ORAL

## 2015-12-06 MED ORDER — TRASTUZUMAB CHEMO INJECTION 440 MG
6.0000 mg/kg | Freq: Once | INTRAVENOUS | Status: AC
  Administered 2015-12-06: 567 mg via INTRAVENOUS
  Filled 2015-12-06: qty 27

## 2015-12-06 MED ORDER — HEPARIN SOD (PORK) LOCK FLUSH 100 UNIT/ML IV SOLN
500.0000 [IU] | Freq: Once | INTRAVENOUS | Status: AC | PRN
  Administered 2015-12-06: 500 [IU]
  Filled 2015-12-06: qty 5

## 2015-12-06 MED ORDER — DIPHENHYDRAMINE HCL 25 MG PO CAPS
ORAL_CAPSULE | ORAL | Status: AC
  Filled 2015-12-06: qty 1

## 2015-12-06 MED ORDER — ACETAMINOPHEN 325 MG PO TABS
650.0000 mg | ORAL_TABLET | Freq: Once | ORAL | Status: AC
  Administered 2015-12-06: 650 mg via ORAL

## 2015-12-06 NOTE — Patient Instructions (Signed)
Trastuzumab injection for infusion What is this medicine? TRASTUZUMAB (tras TOO zoo mab) is a monoclonal antibody. It is used to treat breast cancer and stomach cancer. This medicine may be used for other purposes; ask your health care provider or pharmacist if you have questions. What should I tell my health care provider before I take this medicine? They need to know if you have any of these conditions: -heart disease -heart failure -infection (especially a virus infection such as chickenpox, cold sores, or herpes) -lung or breathing disease, like asthma -recent or ongoing radiation therapy -an unusual or allergic reaction to trastuzumab, benzyl alcohol, or other medications, foods, dyes, or preservatives -pregnant or trying to get pregnant -breast-feeding How should I use this medicine? This drug is given as an infusion into a vein. It is administered in a hospital or clinic by a specially trained health care professional. Talk to your pediatrician regarding the use of this medicine in children. This medicine is not approved for use in children. Overdosage: If you think you have taken too much of this medicine contact a poison control center or emergency room at once. NOTE: This medicine is only for you. Do not share this medicine with others. What if I miss a dose? It is important not to miss a dose. Call your doctor or health care professional if you are unable to keep an appointment. What may interact with this medicine? -doxorubicin -warfarin This list may not describe all possible interactions. Give your health care provider a list of all the medicines, herbs, non-prescription drugs, or dietary supplements you use. Also tell them if you smoke, drink alcohol, or use illegal drugs. Some items may interact with your medicine. What should I watch for while using this medicine? Visit your doctor for checks on your progress. Report any side effects. Continue your course of treatment even  though you feel ill unless your doctor tells you to stop. Call your doctor or health care professional for advice if you get a fever, chills or sore throat, or other symptoms of a cold or flu. Do not treat yourself. Try to avoid being around people who are sick. You may experience fever, chills and shaking during your first infusion. These effects are usually mild and can be treated with other medicines. Report any side effects during the infusion to your health care professional. Fever and chills usually do not happen with later infusions. Do not become pregnant while taking this medicine or for 7 months after stopping it. Women should inform their doctor if they wish to become pregnant or think they might be pregnant. Women of child-bearing potential will need to have a negative pregnancy test before starting this medicine. There is a potential for serious side effects to an unborn child. Talk to your health care professional or pharmacist for more information. Do not breast-feed an infant while taking this medicine or for 7 months after stopping it. Women must use effective birth control with this medicine. What side effects may I notice from receiving this medicine? Side effects that you should report to your doctor or other health care professional as soon as possible: -breathing difficulties -chest pain or palpitations -cough -dizziness or fainting -fever or chills, sore throat -skin rash, itching or hives -swelling of the legs or ankles -unusually weak or tired Side effects that usually do not require medical attention (report to your doctor or other health care professional if they continue or are bothersome): -loss of appetite -headache -muscle aches -nausea This   list may not describe all possible side effects. Call your doctor for medical advice about side effects. You may report side effects to FDA at 1-800-FDA-1088. Where should I keep my medicine? This drug is given in a hospital  or clinic and will not be stored at home. NOTE: This sheet is a summary. It may not cover all possible information. If you have questions about this medicine, talk to your doctor, pharmacist, or health care provider.    2016, [employer]. (2014-10-28 11:49:32)   

## 2015-12-06 NOTE — Telephone Encounter (Signed)
appt made and avs printed °

## 2015-12-06 NOTE — Progress Notes (Signed)
[place]  [phone] [phone]   ID: Phyllis Wiggins DOB: 11-Jun-1971  MR#: [id number]  AOZ#:[id number]  Patient Care Team: Mora Bellman, MD as PCP - General (Obstetrics and Gynecology) Autumn Messing III, MD as Consulting Physician (General Surgery) Chauncey Cruel, MD as Consulting Physician (Oncology) [name], MD as Consulting Physician (Radiation Oncology) Sylvan Cheese, NP as Nurse Practitioner (Hematology and Oncology) Autumn Messing III, MD as Consulting Physician (General Surgery) PCP: Mora Bellman, MD GYN: OTHER MD:   CHIEF COMPLAINT: HER-2 positive, 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 She brought it to medical attention when she presented for the free Pap smear screening clinic here 06/01/2015. Exam on that day showed a lump in the right breast at the 4:00 position under the areola. There was also a scar on the right outer breast. This scar was noted in 01/04/2008 when she had her baseline mammogram at [facility]. It had been stable for a year at that time and the patient had treated it with a "blood root paste" which caused scarring. 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 It is in a separate location from the new mass.  On 06/05/2015 [name] underwent bilateral diagnostic mammography at [facility], with bilateral ultrasonography. This found the breast density to be category B. There was a 2.5 cm oval mass in the right breast at the 5:00 position which by ultrasound measured 2.1 cm. In the left breast there was an area of focal asymmetry which by ultrasound was consistent with fibroglandular tissue/benign.  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  Her subsequent history is as detailed below.  INTERVAL HISTORY: Phyllis Wiggins returns for follow up of her estrogen and progesterone receptor negative breast cancer accompanied by a friend. Since her last visit here she stopped her chemotherapy and started the trastuzumab, which she continues to tolerate well. She also had her right lumpectomy and sentinel lymph node sampling, on 11/09/2015. The pathology from this procedure ([id number]) showed a complete pathologic response in the breast and all 3 sentinel lymph nodes sampled.   She continues on trastuzumab every 3 weeks, with a dose due today. Last echocardiogram was March 2017  REVIEW OF SYSTEMS:  Phyllis Wiggins did very well with her surgery, with very little pain, no bleeding, and no fever. She developed a little bit of swelling in the right arm and more importantly discomfort in the right axilla. She developed a "web syndrome", but has been able to "burst it" by stretching. She is not on pain medication at present. She has a little bit of a runny nose, some heartburn, and some stiffness in her joints. She is also having hot flashes, which had disappeared before the surgery but have returned with a vengeance. Aside from these issues a detailed review of systems today was stable  PAST MEDICAL HISTORY: Past Medical History  Diagnosis Date  . Breast cancer of lower-outer quadrant of right female breast (Phyllis Wiggins) 06/20/2015  . Breast cancer (Phyllis Wiggins)   . Arthritis   . Family history of stomach cancer     PAST SURGICAL HISTORY: Past  Surgical History  Procedure Laterality Date  . Wisdom tooth extraction    . Portacath placement Left 07/06/2015    Procedure: INSERTION PORT-A-CATH;  Surgeon: Autumn Messing III, MD;  Location: [facility];  Service: General;  Laterality: Left;  .  Breast lumpectomy with radioactive seed and sentinel lymph node biopsy Right 11/09/2015    Procedure: RIGHT BREAST LUMPECTOMY WITH RADIOACTIVE SEED AND SENTINEL LYMPH NODE BIOPSY AND EXCISION OF RIGHT BREAST FIBROADENOMA;  Surgeon: Autumn Messing III, MD;  Location: [facility];  Service: General;  Laterality: Right;    FAMILY HISTORY Family History  Problem Relation Age of Onset  . Diabetes Mother   . Hypertension Mother   . Hyperlipidemia Mother   . Stomach cancer Maternal Grandfather 83  . Diabetes Paternal Grandmother   . Dementia Maternal Uncle   . Congestive Heart Failure Maternal Grandmother    the patient's parents are both living, in their early 65s. The patient had no brother, one sister. The patient's mother works by Education administrator homes for private clients. The patient's sister is a Pharmacist, hospital in [place] there is no history of breast or ovarian cancer in the family. The patient's paternal grandfather died at the age of 43 from stomach cancer felt to be related to iron unusual toxic exposure   GYNECOLOGIC HISTORY:  No LMP recorded. Patient is not currently having periods (Reason: Chemotherapy).  menarche age 61, she is [place] P0  she is still having periods but they're quite sporadic. They may occur 6 weeks apart or 3 weeks apart, may be heavy or only spotting. She used oral contraceptives for more than 20 years with no complications   SOCIAL HISTORY:  [name] works multiple jobs as Haematologist, Designer, industrial/product, Radiation protection practitioner, and other part-time jobs. She is divorced and at home lives with 6 dogs.     ADVANCED DIRECTIVES:  not in place    HEALTH MAINTENANCE: Social History  Substance Use Topics  . Smoking status: Former Smoker    Types: Cigarettes    Quit date: 07/04/2010  . Smokeless tobacco: Never Used  . Alcohol Use: No     Colonoscopy:  PAP: October 2016   Bone density:  Lipid panel:  Allergies  Allergen Reactions  . Cheese   . Dairy Aid  [Lactase]     "sinus infections"  . Whey     Sinus issues  . 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 acetaminophen (TYLENOL) 500 MG tablet Take 500 mg by mouth every 6 (six) hours as needed for mild pain. Reported on 11/30/2015    . dexamethasone (DECADRON) 4 MG tablet Take 2 tablets (8 mg total) by mouth 2 (two) times daily. Start the day before Taxotere. Then again the day after chemo for 3 days. (Patient not taking: Reported on 10/10/2015) 30 tablet 0  . ibuprofen (ADVIL,MOTRIN) 200 MG tablet Take 200 mg by mouth every 6 (six) hours as needed for mild pain. Reported on 11/30/2015    . lidocaine-prilocaine (EMLA) cream Apply to affected area once (Patient not taking: Reported on 11/30/2015) 30 g 3  . ondansetron (ZOFRAN) 8 MG tablet Take 1 tablet (8 mg total) by mouth 2 (two) times daily. Start the day after chemo for 3 days. Then take as needed for nausea or vomiting. (Patient not taking: Reported on 10/10/2015) 30 tablet 1  . oxyCODONE-acetaminophen (ROXICET) 5-325 MG tablet Take  1-2 tablets by mouth every 4 (four) hours as needed. (Patient not taking: Reported on 07/28/2015) 50 tablet 0  . oxyCODONE-acetaminophen (ROXICET) 5-325 MG tablet Take 1-2 tablets by mouth every 4 (four) hours as needed. (Patient not taking: Reported on 11/30/2015) 30 tablet 0  . prochlorperazine (COMPAZINE) 10 MG tablet Take 1 tablet (10 mg total) by mouth every 6 (six) hours as needed (Nausea or vomiting). (Patient not taking: Reported on 10/25/2015) 30 tablet 1   No current facility-administered medications for this visit.    OBJECTIVE: Middle-aged white woman Who appears well Filed Vitals:   12/06/15 0858  BP: 109/68  Pulse: 96  Temp: 98.2 F (36.8 C)  Resp: 18     Body mass index is 33.96 kg/(m^2).    ECOG FS:0 - Asymptomatic   Sclerae unicteric, pupils round and equal Oropharynx clear and moist-- no thrush or other  lesions No cervical or supraclavicular adenopathy Lungs no rales or rhonchi Heart regular rate and rhythm Abd soft, nontender, positive bowel sounds MSK no focal spinal tenderness, grade 1 left upper extremity lymphedema Neuro: nonfocal, well oriented, appropriate affect Breasts: The right breast is status post recent lumpectomy. The cosmetic result is excellent. There is no evidence of local recurrence. The right axilla is benign. Left breast is unremarkable   LAB RESULTS:  CMP     Component Value Date/Time   NA 139 11/14/2015 0853   K 4.5 11/14/2015 0853   CO2 22 11/14/2015 0853   GLUCOSE 197* 11/14/2015 0853   BUN 9.6 11/14/2015 0853   CREATININE 0.9 11/14/2015 0853   CALCIUM 10.0 11/14/2015 0853   PROT 7.1 11/14/2015 0853   ALBUMIN 3.4* 11/14/2015 0853   AST 18 11/14/2015 0853   ALT 16 11/14/2015 0853   ALKPHOS 49 11/14/2015 0853   BILITOT 0.33 11/14/2015 0853    INo results found for: SPEP, UPEP  Lab Results  Component Value Date   WBC 6.4 12/06/2015   NEUTROABS 3.5 12/06/2015   HGB 12.7 12/06/2015   HCT 37.5 12/06/2015   MCV 94.5 12/06/2015   PLT 221 12/06/2015      Chemistry      Component Value Date/Time   NA 139 11/14/2015 0853   K 4.5 11/14/2015 0853   CO2 22 11/14/2015 0853   BUN 9.6 11/14/2015 0853   CREATININE 0.9 11/14/2015 0853      Component Value Date/Time   CALCIUM 10.0 11/14/2015 0853   ALKPHOS 49 11/14/2015 0853   AST 18 11/14/2015 0853   ALT 16 11/14/2015 0853   BILITOT 0.33 11/14/2015 0853       No results found for: LABCA2  No components found for: LABCA125  No results for input(s): INR in the last 168 hours.  Urinalysis No results found for: COLORURINE, APPEARANCEUR, LABSPEC, PHURINE, GLUCOSEU, HGBUR, BILIRUBINUR, KETONESUR, PROTEINUR, UROBILINOGEN, NITRITE, LEUKOCYTESUR  STUDIES: Nm Sentinel Node Inj-no Rpt (breast)  11/09/2015  CLINICAL DATA: right breast cancer Sulfur colloid was injected intradermally by the nuclear  medicine technologist for breast cancer sentinel node localization.    ASSESSMENT: 45 y.o. BRCA negative [place] woman status post left breast biopsy 06/15/2015 for a clinical T1 cN0, stage IA invasive ductal carcinoma, grade 3, estrogen and progesterone receptor negative, HER-2 amplified, with an MIB-1 of 40%  (1) neoadjuvant chemotherapy to consist of carboplatin, docetaxel, pertuzumab, and trastuzumab with onpro support every 3 weeks 6 started 07/11/2015, last dose T/P 10/25/2015  (a) carboplatin and docetaxel stopped after 5 cycles due to patient intolerance  (2)  Status post right lumpectomy and sentinel lymph node sampling for 01/23/2016 showing a complete pathologic response in the breast and 3 sentinel lymph nodes sampled, ypT0 ypN0  (3) adjuvant radiation to follow   (4) trastuzumab started 11/14/2015, to be continued to mid December 2017  (a) echocardiogram 10/13/2015 shows an ejection fraction of 55%  (5) genetics testing 08/16/2015 through the Breast/Ovarian gene panel offered by [facility] i found no deleterious mutations in ATM, BARD1, BRCA1, BRCA2, BRIP1, CDH1, CHEK2, EPCAM, FANCC, MLH1, MSH2, MSH6, NBN, PALB2, PMS2, PTEN, RAD51C, RAD51D, TP53, and XRCC2.   PLAN: Kresta did terrific with her surgery and achieved a complete pathologic response. This means her risk of this cancer recurring within the next 5 years is less than 10%.  I have referred her to Dr. Ronna Polio for cardio oncology follow-up. They will also arrange her echocardiogram in June.  She has developed the right axillary Webb syndrome, but is improving already by doing stretching exercises. I think she will benefit from an additional visit to physical therapy for lymphedema management. She will also need a compression sleeve.  We discussed management of hot flashes and specifically the use of gabapentin and venlafaxine. At this point she wants to wait and see if the hot flashes go away. They really had decreased  before her surgery. I think it's reasonable to wait a little before starting medication.  She is ready to start radiation which likely will begin next week. I have scheduled her to return to see me in late June after her next echocardiogram. She should be done with radiation by then and we can start long-term follow-up that  Chauncey Cruel, MD   12/06/2015 9:11 AM

## 2015-12-07 ENCOUNTER — Ambulatory Visit
Admission: RE | Admit: 2015-12-07 | Discharge: 2015-12-07 | Disposition: A | Discharge status: Home / Self Care | Payer: Medicaid Other | Source: Ambulatory Visit | Attending: Radiation Oncology | Admitting: Radiation Oncology

## 2015-12-07 DIAGNOSIS — C50511 Malignant neoplasm of lower-outer quadrant of right female breast: Secondary | ICD-10-CM

## 2015-12-07 DIAGNOSIS — Z510 Encounter for antineoplastic radiation therapy: Secondary | ICD-10-CM | POA: Diagnosis not present

## 2015-12-07 NOTE — Progress Notes (Signed)
Name: Phyllis Wiggins   MRN: [id number]  Date:  12/07/2015  DOB: 01-07-1971  Status:outpatient    DIAGNOSIS: Breast cancer.  CONSENT VERIFIED: yes   SET UP: Patient is setup supine   IMMOBILIZATION:  The following immobilization was used:Custom Moldable Pillow, breast board.   NARRATIVE: [name] was brought to the [facility].  Identity was confirmed.  All relevant records and images related to the planned course of therapy were reviewed.  Then, the patient was positioned in a stable reproducible clinical set-up for radiation therapy.  Wires were placed to delineate the clinical extent of breast tissue. A wire was placed on the scar as well.  CT images were obtained.  An isocenter was placed. Skin markings were placed.  The CT images were loaded into the planning software where the target and avoidance structures were contoured.  The radiation prescription was entered and confirmed. The patient was discharged in stable condition and tolerated simulation well.    TREATMENT PLANNING NOTE:  Treatment planning then occurred. I have requested : MLC's, isodose plan, basic dose calculation  I personally designed and supervised the construction of 3 medically necessary complex treatment devices for the protection of critical normal structures including the lungs and contralateral breast as well as the immobilization device which is necessary for set up certainty.   3D simulation occurred. I requested and analyzed a dose volume histogram of the heart, lungs and lumpectomy cavity.   This document serves as a record of services personally performed by [name], MD. It was created on her behalf by Darcus Austin, a trained medical scribe. The creation of this record is based on the scribe's personal observations and the provider's statements to them. This document has been checked and approved by the attending provider.

## 2015-12-07 NOTE — Progress Notes (Signed)
Radiation Oncology         [phone] ________________________________  Name: Phyllis Wiggins      MRN: [id number]          Date: 12/07/2015              DOB: 1971/01/22  Optical Surface Tracking Plan:  Since intensity modulated radiotherapy (IMRT) and 3D conformal radiation treatment methods are predicated on accurate and precise positioning for treatment, intrafraction motion monitoring is medically necessary to ensure accurate and safe treatment delivery.  The ability to quantify intrafraction motion without excessive ionizing radiation dose can only be performed with optical surface tracking. Accordingly, surface imaging offers the opportunity to obtain 3D measurements of patient position throughout IMRT and 3D treatments without excessive radiation exposure.  I am ordering optical surface tracking for this patient's upcoming course of radiotherapy. ________________________________ ------------------------------------------------  [name], MD  This document serves as a record of services personally performed by [name], MD. It was created on her behalf by Darcus Austin, a trained medical scribe. The creation of this record is based on the scribe's personal observations and the provider's statements to them. This document has been checked and approved by the attending provider.   Reference:   Ursula Alert, J, et al. Surface imaging-based analysis of intrafraction motion for breast radiotherapy patients.Journal of [place], n. 6, nov. 2014. ISSN [id number].   Available at: <http://www.jacmp.org/index.php/jacmp/article/view/4957>.

## 2015-12-11 ENCOUNTER — Telehealth (HOSPITAL_COMMUNITY): Payer: Self-pay | Admitting: Vascular Surgery

## 2015-12-11 NOTE — Telephone Encounter (Signed)
Left pt message make appt w/ echo

## 2015-12-14 ENCOUNTER — Ambulatory Visit
Admission: RE | Admit: 2015-12-14 | Discharge: 2015-12-14 | Disposition: A | Discharge status: Home / Self Care | Payer: Medicaid Other | Source: Ambulatory Visit | Attending: Radiation Oncology | Admitting: Radiation Oncology

## 2015-12-14 DIAGNOSIS — Z510 Encounter for antineoplastic radiation therapy: Secondary | ICD-10-CM | POA: Diagnosis not present

## 2015-12-18 ENCOUNTER — Ambulatory Visit
Admission: RE | Admit: 2015-12-18 | Discharge: 2015-12-18 | Disposition: A | Discharge status: Home / Self Care | Payer: Medicaid Other | Source: Ambulatory Visit | Attending: Radiation Oncology | Admitting: Radiation Oncology

## 2015-12-18 DIAGNOSIS — Z510 Encounter for antineoplastic radiation therapy: Secondary | ICD-10-CM | POA: Diagnosis not present

## 2015-12-19 ENCOUNTER — Ambulatory Visit
Admission: RE | Admit: 2015-12-19 | Discharge: 2015-12-19 | Disposition: A | Discharge status: Home / Self Care | Payer: Medicaid Other | Source: Ambulatory Visit | Attending: Radiation Oncology | Admitting: Radiation Oncology

## 2015-12-19 ENCOUNTER — Encounter: Payer: Self-pay | Admitting: Radiation Oncology

## 2015-12-19 VITALS — BP 118/79 | HR 103 | Temp 98.4°F | Resp 18 | Ht 67.0 in | Wt 213.0 lb

## 2015-12-19 DIAGNOSIS — Z510 Encounter for antineoplastic radiation therapy: Secondary | ICD-10-CM | POA: Diagnosis not present

## 2015-12-19 DIAGNOSIS — C50511 Malignant neoplasm of lower-outer quadrant of right female breast: Secondary | ICD-10-CM

## 2015-12-19 MED ORDER — RADIAPLEXRX EX GEL
Freq: Once | CUTANEOUS | Status: AC
  Administered 2015-12-19: 15:00:00 via TOPICAL

## 2015-12-19 NOTE — Progress Notes (Signed)
Weekly Management Note Current Dose: 3.6  Gy  Projected Dose: 45 Gy   Narrative:  The patient presents for routine under treatment assessment.  CBCT/MVCT images/Port film x-rays were reviewed.  The chart was checked.  [name] has received 2 fractions to her right breast. Skin to right breast is normal. Radiaplex gel given did not want the deodorant Education done today. Appetite is fine. Having some fatigue during the day at different times. Denies pain this afternoon. Concerned about financial toxicity and how that will affect her after treatment is over. She was worried about not hearing back about grants for funds and also her tax returns.   Physical Findings: Weight: 213 lb (96.616 kg).  No skin changes. Filed Vitals:   12/19/15 1459  BP: 118/79  Pulse: 103  Temp: 98.4 F (36.9 C)  Resp: 18    Impression:  The patient is tolerating radiation.  Plan:  Continue treatment as planned. I will ask social work to contact her. Nursing education was performed today.  ------------------------------------------------  [name], MD  This document serves as a record of services personally performed by [name], MD. It was created on her behalf by Derek Mound, a trained medical scribe. The creation of this record is based on the scribe's personal observations and the provider's statements to them. This document has been checked and approved by the attending provider.

## 2015-12-19 NOTE — Progress Notes (Addendum)
[name] has received 2 fractions to her right breast. Skin to right breast is normal.  Radiaplex gel given did not want the deodorant   Education done today.  Appetite is fine.  Having some fatigue during the day at different times.  Denies pain this afternoon. BP 118/79 mmHg  Pulse 103  Temp(Src) 98.4 F (36.9 C)  Resp 18  Ht 5\' 7"  (1.702 m)  Wt 213 lb (96.616 kg)  BMI 33.35 kg/m2  SpO2 100%

## 2015-12-19 NOTE — Addendum Note (Signed)
Encounter addended by: Malena Edman, RN on: 12/19/2015  5:16 PM<BR>     Documentation filed: Inpatient Patient Education

## 2015-12-20 ENCOUNTER — Encounter: Payer: Self-pay | Admitting: *Deleted

## 2015-12-20 ENCOUNTER — Ambulatory Visit
Admission: RE | Admit: 2015-12-20 | Discharge: 2015-12-20 | Disposition: A | Discharge status: Home / Self Care | Payer: Medicaid Other | Source: Ambulatory Visit | Attending: Radiation Oncology | Admitting: Radiation Oncology

## 2015-12-20 DIAGNOSIS — Z510 Encounter for antineoplastic radiation therapy: Secondary | ICD-10-CM | POA: Diagnosis not present

## 2015-12-20 NOTE — Progress Notes (Signed)
[facility] Work  Clinical Social Work was referred by Pension scheme manager for assessment of psychosocial needs.  Clinical Social Worker contacted patient at home to offer support and assess for needs.  Pt reports ongoing financial concerns. Pt was approved for the [employer] and has exhausted those resources currently. CSW reviewed possible resource options that could assist without a current tax return. CSW explained there are very limited options without that financial documentation as they all require copies of tax returns. Pt reports to currently be working as much as possible. Pt plans to contact [facility] for possible grant and CSW will provide pt with additional grant resource applications. CSW to follow and assist accordingly.     Clinical Social Work interventions: Resource education and referral  Loren Racer, [facility] Worker [place]  [place] Phone: [phone] Fax: [phone]

## 2015-12-21 ENCOUNTER — Ambulatory Visit
Admission: RE | Admit: 2015-12-21 | Discharge: 2015-12-21 | Disposition: A | Discharge status: Home / Self Care | Payer: Medicaid Other | Source: Ambulatory Visit | Attending: Radiation Oncology | Admitting: Radiation Oncology

## 2015-12-21 DIAGNOSIS — Z510 Encounter for antineoplastic radiation therapy: Secondary | ICD-10-CM | POA: Diagnosis not present

## 2015-12-22 ENCOUNTER — Inpatient Hospital Stay: Admission: RE | Admit: 2015-12-22 | Payer: Self-pay | Source: Ambulatory Visit | Admitting: Radiation Oncology

## 2015-12-22 ENCOUNTER — Ambulatory Visit
Admission: RE | Admit: 2015-12-22 | Discharge: 2015-12-22 | Disposition: A | Discharge status: Home / Self Care | Payer: Medicaid Other | Source: Ambulatory Visit | Attending: Radiation Oncology | Admitting: Radiation Oncology

## 2015-12-22 DIAGNOSIS — Z510 Encounter for antineoplastic radiation therapy: Secondary | ICD-10-CM | POA: Diagnosis not present

## 2015-12-25 ENCOUNTER — Ambulatory Visit
Admission: RE | Admit: 2015-12-25 | Discharge: 2015-12-25 | Disposition: A | Discharge status: Home / Self Care | Payer: Medicaid Other | Source: Ambulatory Visit | Attending: Radiation Oncology | Admitting: Radiation Oncology

## 2015-12-25 DIAGNOSIS — Z510 Encounter for antineoplastic radiation therapy: Secondary | ICD-10-CM | POA: Diagnosis not present

## 2015-12-26 ENCOUNTER — Ambulatory Visit
Admission: RE | Admit: 2015-12-26 | Discharge: 2015-12-26 | Disposition: A | Discharge status: Home / Self Care | Payer: Medicaid Other | Source: Ambulatory Visit | Attending: Radiation Oncology | Admitting: Radiation Oncology

## 2015-12-26 ENCOUNTER — Ambulatory Visit: Payer: Medicaid Other

## 2015-12-26 ENCOUNTER — Encounter: Payer: Self-pay | Admitting: Radiation Oncology

## 2015-12-26 ENCOUNTER — Telehealth: Payer: Self-pay | Admitting: *Deleted

## 2015-12-26 ENCOUNTER — Ambulatory Visit (HOSPITAL_BASED_OUTPATIENT_CLINIC_OR_DEPARTMENT_OTHER): Payer: Medicaid Other | Admitting: *Deleted

## 2015-12-26 VITALS — BP 115/73 | HR 80 | Temp 98.6°F | Resp 18

## 2015-12-26 VITALS — BP 104/80 | HR 97 | Temp 98.6°F | Ht 67.0 in | Wt 221.2 lb

## 2015-12-26 DIAGNOSIS — Z5112 Encounter for antineoplastic immunotherapy: Secondary | ICD-10-CM | POA: Diagnosis not present

## 2015-12-26 DIAGNOSIS — C50511 Malignant neoplasm of lower-outer quadrant of right female breast: Secondary | ICD-10-CM | POA: Diagnosis not present

## 2015-12-26 DIAGNOSIS — Z510 Encounter for antineoplastic radiation therapy: Secondary | ICD-10-CM | POA: Diagnosis not present

## 2015-12-26 LAB — COMPREHENSIVE METABOLIC PANEL
ALT: 18 U/L (ref 0–55)
AST: 16 U/L (ref 5–34)
Albumin: 3.6 g/dL (ref 3.5–5.0)
Alkaline Phosphatase: 57 U/L (ref 40–150)
Anion Gap: 10 mEq/L (ref 3–11)
BUN: 9.3 mg/dL (ref 7.0–26.0)
CALCIUM: 10.1 mg/dL (ref 8.4–10.4)
CHLORIDE: 106 meq/L (ref 98–109)
CO2: 26 mEq/L (ref 22–29)
Creatinine: 0.9 mg/dL (ref 0.6–1.1)
EGFR: 81 mL/min/{1.73_m2} — AB (ref >90)
Glucose: 133 mg/dl (ref 70–140)
POTASSIUM: 4 meq/L (ref 3.5–5.1)
SODIUM: 142 meq/L (ref 136–145)
Total Bilirubin: <0.3 mg/dL (ref 0.20–1.20)
Total Protein: 7.3 g/dL (ref 6.4–8.3)

## 2015-12-26 MED ORDER — DIPHENHYDRAMINE HCL 25 MG PO CAPS
ORAL_CAPSULE | ORAL | Status: AC
  Filled 2015-12-26: qty 1

## 2015-12-26 MED ORDER — ACETAMINOPHEN 325 MG PO TABS
ORAL_TABLET | ORAL | Status: AC
  Filled 2015-12-26: qty 2

## 2015-12-26 MED ORDER — TRASTUZUMAB CHEMO INJECTION 440 MG
6.0000 mg/kg | Freq: Once | INTRAVENOUS | Status: AC
  Administered 2015-12-26: 567 mg via INTRAVENOUS
  Filled 2015-12-26: qty 27

## 2015-12-26 MED ORDER — SODIUM CHLORIDE 0.9 % IV SOLN
Freq: Once | INTRAVENOUS | Status: AC
  Administered 2015-12-26: 10:00:00 via INTRAVENOUS

## 2015-12-26 MED ORDER — DIPHENHYDRAMINE HCL 25 MG PO CAPS
25.0000 mg | ORAL_CAPSULE | Freq: Once | ORAL | Status: AC
  Administered 2015-12-26: 25 mg via ORAL

## 2015-12-26 MED ORDER — SODIUM CHLORIDE 0.9% FLUSH
10.0000 mL | INTRAVENOUS | Status: DC | PRN
  Administered 2015-12-26: 10 mL
  Filled 2015-12-26: qty 10

## 2015-12-26 MED ORDER — HEPARIN SOD (PORK) LOCK FLUSH 100 UNIT/ML IV SOLN
500.0000 [IU] | Freq: Once | INTRAVENOUS | Status: AC | PRN
  Administered 2015-12-26: 500 [IU]
  Filled 2015-12-26: qty 5

## 2015-12-26 MED ORDER — ACETAMINOPHEN 325 MG PO TABS
650.0000 mg | ORAL_TABLET | Freq: Once | ORAL | Status: AC
  Administered 2015-12-26: 650 mg via ORAL

## 2015-12-26 NOTE — Progress Notes (Signed)
Phyllis Wiggins has received 7 fractions to her right breast.  Note mild erythema of th anterior breast and inframmary fold with intact skin.  States she has fatigue, but has not worsened since start of XRT.

## 2015-12-26 NOTE — Telephone Encounter (Signed)
Pt relate doing well during xrt and without complaints. Encourage pt to call with needs or questions. Received verbal understanding.

## 2015-12-26 NOTE — Patient Instructions (Signed)
Trastuzumab injection for infusion What is this medicine? TRASTUZUMAB (tras TOO zoo mab) is a monoclonal antibody. It is used to treat breast cancer and stomach cancer. This medicine may be used for other purposes; ask your health care provider or pharmacist if you have questions. What should I tell my health care provider before I take this medicine? They need to know if you have any of these conditions: -heart disease -heart failure -infection (especially a virus infection such as chickenpox, cold sores, or herpes) -lung or breathing disease, like asthma -recent or ongoing radiation therapy -an unusual or allergic reaction to trastuzumab, benzyl alcohol, or other medications, foods, dyes, or preservatives -pregnant or trying to get pregnant -breast-feeding How should I use this medicine? This drug is given as an infusion into a vein. It is administered in a hospital or clinic by a specially trained health care professional. Talk to your pediatrician regarding the use of this medicine in children. This medicine is not approved for use in children. Overdosage: If you think you have taken too much of this medicine contact a poison control center or emergency room at once. NOTE: This medicine is only for you. Do not share this medicine with others. What if I miss a dose? It is important not to miss a dose. Call your doctor or health care professional if you are unable to keep an appointment. What may interact with this medicine? -doxorubicin -warfarin This list may not describe all possible interactions. Give your health care provider a list of all the medicines, herbs, non-prescription drugs, or dietary supplements you use. Also tell them if you smoke, drink alcohol, or use illegal drugs. Some items may interact with your medicine. What should I watch for while using this medicine? Visit your doctor for checks on your progress. Report any side effects. Continue your course of treatment even  though you feel ill unless your doctor tells you to stop. Call your doctor or health care professional for advice if you get a fever, chills or sore throat, or other symptoms of a cold or flu. Do not treat yourself. Try to avoid being around people who are sick. You may experience fever, chills and shaking during your first infusion. These effects are usually mild and can be treated with other medicines. Report any side effects during the infusion to your health care professional. Fever and chills usually do not happen with later infusions. Do not become pregnant while taking this medicine or for 7 months after stopping it. Women should inform their doctor if they wish to become pregnant or think they might be pregnant. Women of child-bearing potential will need to have a negative pregnancy test before starting this medicine. There is a potential for serious side effects to an unborn child. Talk to your health care professional or pharmacist for more information. Do not breast-feed an infant while taking this medicine or for 7 months after stopping it. Women must use effective birth control with this medicine. What side effects may I notice from receiving this medicine? Side effects that you should report to your doctor or other health care professional as soon as possible: -breathing difficulties -chest pain or palpitations -cough -dizziness or fainting -fever or chills, sore throat -skin rash, itching or hives -swelling of the legs or ankles -unusually weak or tired Side effects that usually do not require medical attention (report to your doctor or other health care professional if they continue or are bothersome): -loss of appetite -headache -muscle aches -nausea This   list may not describe all possible side effects. Call your doctor for medical advice about side effects. You may report side effects to FDA at 1-800-FDA-1088. Where should I keep my medicine? This drug is given in a hospital  or clinic and will not be stored at home. NOTE: This sheet is a summary. It may not cover all possible information. If you have questions about this medicine, talk to your doctor, pharmacist, or health care provider.    2016, [employer]. (2014-10-28 11:49:32)   

## 2015-12-26 NOTE — Addendum Note (Signed)
Encounter addended by: [name], MD on: 12/26/2015  9:31 AM<BR>     Documentation filed: Notes Section, Problem List

## 2015-12-26 NOTE — Progress Notes (Addendum)
Weekly Management Note Current Dose: 12.6  Gy  Projected Dose: 45 Gy   Narrative:  The patient presents for routine under treatment assessment.  CBCT/MVCT images/Port film x-rays were reviewed.  The chart was checked.  Phyllis Wiggins has received 7 fractions to her right breast. Note mild erythema of th anterior breast and inframmary fold with intact skin. States she has fatigue, but has not worsened since start of XRT. Questions about supplements. Is a picky eater and dose not get her required nutritional needs through her diet per patient.   Physical Findings: Weight: 221 lb 3.2 oz (100.336 kg).  No skin changes. Filed Vitals:   12/26/15 0818  BP: 104/80  Pulse: 97  Temp: 98.6 F (37 C)    Impression:  The patient is tolerating radiation.  Plan:  Continue treatment as planned. Cautioned against lack of evidence regarding supplements. Cautioned especially against high dose anitoxidants based on lung and head and neck RCTs. Cautioned against use of deo with zinc due to skin reaction.   ------------------------------------------------  [name], MD    This document serves as a record of services personally performed by [name], MD. It was created on her behalf by  Lendon Collar, a trained medical scribe. The creation of this record is based on the scribe's personal observations and the provider's statements to them. This document has been checked and approved by the attending provider.

## 2015-12-27 ENCOUNTER — Ambulatory Visit
Admission: RE | Admit: 2015-12-27 | Discharge: 2015-12-27 | Disposition: A | Discharge status: Home / Self Care | Payer: Medicaid Other | Source: Ambulatory Visit | Attending: Radiation Oncology | Admitting: Radiation Oncology

## 2015-12-27 DIAGNOSIS — Z510 Encounter for antineoplastic radiation therapy: Secondary | ICD-10-CM | POA: Diagnosis not present

## 2015-12-28 ENCOUNTER — Ambulatory Visit
Admission: RE | Admit: 2015-12-28 | Discharge: 2015-12-28 | Disposition: A | Discharge status: Home / Self Care | Payer: Medicaid Other | Source: Ambulatory Visit | Attending: Radiation Oncology | Admitting: Radiation Oncology

## 2015-12-28 DIAGNOSIS — Z510 Encounter for antineoplastic radiation therapy: Secondary | ICD-10-CM | POA: Diagnosis not present

## 2015-12-29 ENCOUNTER — Ambulatory Visit
Admission: RE | Admit: 2015-12-29 | Discharge: 2015-12-29 | Disposition: A | Discharge status: Home / Self Care | Payer: Medicaid Other | Source: Ambulatory Visit | Attending: Radiation Oncology | Admitting: Radiation Oncology

## 2015-12-29 DIAGNOSIS — Z510 Encounter for antineoplastic radiation therapy: Secondary | ICD-10-CM | POA: Diagnosis not present

## 2016-01-02 ENCOUNTER — Ambulatory Visit
Admission: RE | Admit: 2016-01-02 | Discharge: 2016-01-02 | Disposition: A | Discharge status: Home / Self Care | Payer: Medicaid Other | Source: Ambulatory Visit | Attending: Radiation Oncology | Admitting: Radiation Oncology

## 2016-01-02 ENCOUNTER — Encounter: Payer: Self-pay | Admitting: Radiation Oncology

## 2016-01-02 VITALS — BP 107/85 | HR 104 | Temp 98.5°F | Resp 18 | Ht 67.0 in | Wt 221.8 lb

## 2016-01-02 DIAGNOSIS — Z510 Encounter for antineoplastic radiation therapy: Secondary | ICD-10-CM | POA: Diagnosis not present

## 2016-01-02 DIAGNOSIS — C50511 Malignant neoplasm of lower-outer quadrant of right female breast: Secondary | ICD-10-CM

## 2016-01-02 NOTE — Progress Notes (Signed)
Weekly Management Note Current Dose: 19.8  Gy  Projected Dose: 45 Gy   Narrative:  The patient presents for routine under treatment assessment.  CBCT/MVCT images/Port film x-rays were reviewed.  The chart was checked.  Ms. Varney has received 11 fractions to her right breast. The patient reports a good appetite and denies pain, but has fatigue due to her dogs.   Physical Findings: Weight: 221 lb 12.8 oz (100.608 kg).  No skin changes. Filed Vitals:   01/02/16 1323  BP: 107/85  Pulse: 104  Temp: 98.5 F (36.9 C)  Resp: 18    Impression:  The patient is tolerating radiation.  Plan:  Continue treatment as planned.  ------------------------------------------------  [name], MD    This document serves as a record of services personally performed by [name], MD. It was created on her behalf by  Lendon Collar, a trained medical scribe. The creation of this record is based on the scribe's personal observations and the provider's statements to them. This document has been checked and approved by the attending provider.

## 2016-01-02 NOTE — Progress Notes (Signed)
Phyllis Wiggins has received 11 fractions to her right breast.  Right breast with slight redness.  Using Radiaplex gel bid.  Appetite is good.  Having fatigue most of the day.  Denies pain. BP 107/85 mmHg  Pulse 104  Temp(Src) 98.5 F (36.9 C) (Oral)  Resp 18  Ht 5\' 7"  (1.702 m)  Wt 221 lb 12.8 oz (100.608 kg)  BMI 34.73 kg/m2  SpO2 98%

## 2016-01-03 ENCOUNTER — Ambulatory Visit
Admission: RE | Admit: 2016-01-03 | Discharge: 2016-01-03 | Disposition: A | Discharge status: Home / Self Care | Payer: Medicaid Other | Source: Ambulatory Visit | Attending: Radiation Oncology | Admitting: Radiation Oncology

## 2016-01-03 DIAGNOSIS — Z510 Encounter for antineoplastic radiation therapy: Secondary | ICD-10-CM | POA: Diagnosis not present

## 2016-01-04 ENCOUNTER — Telehealth: Payer: Self-pay | Admitting: Oncology

## 2016-01-04 ENCOUNTER — Ambulatory Visit
Admission: RE | Admit: 2016-01-04 | Discharge: 2016-01-04 | Disposition: A | Discharge status: Home / Self Care | Payer: Medicaid Other | Source: Ambulatory Visit | Attending: Radiation Oncology | Admitting: Radiation Oncology

## 2016-01-04 DIAGNOSIS — Z510 Encounter for antineoplastic radiation therapy: Secondary | ICD-10-CM | POA: Diagnosis not present

## 2016-01-04 NOTE — Telephone Encounter (Addendum)
Left a message for Phyllis Wiggins advising her not to use aloe with pain relief and to try Fruit of the Earth which is available at [facility].

## 2016-01-05 ENCOUNTER — Ambulatory Visit
Admission: RE | Admit: 2016-01-05 | Discharge: 2016-01-05 | Disposition: A | Discharge status: Home / Self Care | Payer: Medicaid Other | Source: Ambulatory Visit | Attending: Radiation Oncology | Admitting: Radiation Oncology

## 2016-01-05 ENCOUNTER — Encounter: Payer: Self-pay | Admitting: Radiation Oncology

## 2016-01-05 DIAGNOSIS — Z510 Encounter for antineoplastic radiation therapy: Secondary | ICD-10-CM | POA: Diagnosis not present

## 2016-01-08 ENCOUNTER — Ambulatory Visit
Admission: RE | Admit: 2016-01-08 | Discharge: 2016-01-08 | Disposition: A | Discharge status: Home / Self Care | Payer: Medicaid Other | Source: Ambulatory Visit | Attending: Radiation Oncology | Admitting: Radiation Oncology

## 2016-01-08 ENCOUNTER — Encounter: Payer: Self-pay | Admitting: Radiation Oncology

## 2016-01-08 VITALS — BP 118/72 | HR 68 | Temp 98.0°F | Resp 16 | Ht 67.0 in | Wt 224.2 lb

## 2016-01-08 DIAGNOSIS — Z510 Encounter for antineoplastic radiation therapy: Secondary | ICD-10-CM | POA: Diagnosis not present

## 2016-01-08 DIAGNOSIS — C50511 Malignant neoplasm of lower-outer quadrant of right female breast: Secondary | ICD-10-CM

## 2016-01-08 NOTE — Progress Notes (Signed)
Ms. Niwa has received 15 fractions to her right breast.  Right breast is red  and sensitive to touch today.  Using Radiaplex gel bid to tid.  Appetite is good.  Having fatigue most of the day.  Pain 6/10 to right breast not taking pain medication. BP 118/72 mmHg  Pulse 68  Temp(Src) 98 F (36.7 C) (Oral)  Resp 16  Ht 5\' 7"  (1.702 m)  Wt 224 lb 3.2 oz (101.696 kg)  BMI 35.11 kg/m2  SpO2 100%

## 2016-01-08 NOTE — Progress Notes (Signed)
Weekly Management Note Current Dose: 27 Gy  Projected Dose: 61 Gy    ICD-9-CM ICD-10-CM   1. Breast cancer of lower-outer quadrant of right female breast ([place]) 174.5 C50.511      Narrative:  The patient presents for routine under treatment assessment.  CBCT/MVCT images/Port film x-rays were reviewed.  The chart was checked.   Main complaint -  Finger nails are brittle due to systemic therapy.  Breast tender, doesn't want pain meds.  Physical Findings: Weight: 224 lb 3.2 oz (101.696 kg).  Erythema over right breast, skin intact. Filed Vitals:   01/08/16 1252  BP: 118/72  Pulse: 68  Temp: 98 F (36.7 C)  Resp: 16    Impression:  The patient is tolerating radiation.  Plan:  Continue treatment as planned. -----------------------------------  Eppie Gibson, MD

## 2016-01-09 ENCOUNTER — Ambulatory Visit
Admission: RE | Admit: 2016-01-09 | Discharge: 2016-01-09 | Disposition: A | Discharge status: Home / Self Care | Payer: Medicaid Other | Source: Ambulatory Visit | Attending: Radiation Oncology | Admitting: Radiation Oncology

## 2016-01-09 ENCOUNTER — Encounter: Payer: Self-pay | Admitting: Radiation Oncology

## 2016-01-09 DIAGNOSIS — Z510 Encounter for antineoplastic radiation therapy: Secondary | ICD-10-CM | POA: Diagnosis not present

## 2016-01-10 ENCOUNTER — Encounter: Payer: Self-pay | Admitting: Radiation Oncology

## 2016-01-10 ENCOUNTER — Ambulatory Visit
Admission: RE | Admit: 2016-01-10 | Discharge: 2016-01-10 | Disposition: A | Discharge status: Home / Self Care | Payer: Medicaid Other | Source: Ambulatory Visit | Attending: Radiation Oncology | Admitting: Radiation Oncology

## 2016-01-10 VITALS — BP 113/68 | HR 92 | Temp 98.4°F | Resp 16 | Ht 67.0 in | Wt 220.2 lb

## 2016-01-10 DIAGNOSIS — L539 Erythematous condition, unspecified: Secondary | ICD-10-CM | POA: Diagnosis not present

## 2016-01-10 DIAGNOSIS — Z510 Encounter for antineoplastic radiation therapy: Secondary | ICD-10-CM | POA: Diagnosis not present

## 2016-01-10 DIAGNOSIS — C50511 Malignant neoplasm of lower-outer quadrant of right female breast: Secondary | ICD-10-CM | POA: Diagnosis not present

## 2016-01-10 MED ORDER — RADIAPLEXRX EX GEL
Freq: Once | CUTANEOUS | Status: AC
  Administered 2016-01-10: 14:00:00 via TOPICAL

## 2016-01-10 NOTE — Progress Notes (Signed)
Weekly Management Note Current Dose: 30.6 Gy  Projected Dose: 61 Gy    ICD-9-CM ICD-10-CM   1. Breast cancer of lower-outer quadrant of right female breast (HCC) 174.5 C50.511 hyaluronate sodium (RADIAPLEXRX) gel     Narrative:  The patient presents for routine under treatment assessment.  CBCT/MVCT images/Port film x-rays were reviewed.  The chart was checked.   Doing well, no new complaints  Physical Findings: Weight: 220 lb 3.2 oz (99.882 kg).  Erythema over right breast, skin intact.  Filed Vitals:   01/10/16 1317  BP: 113/68  Pulse: 92  Temp: 98.4 F (36.9 C)  Resp: 16    Impression:  The patient is tolerating radiation.  Plan:  Continue treatment as planned. -----------------------------------  Eppie Gibson, MD

## 2016-01-10 NOTE — Progress Notes (Signed)
Ms. Schwandt has received 17 fractions to her right  Breast.  Skin to right breast with some redness touch sensitive.  Using Radiaplex gel bid.  Appetite is good.  Having fatigue most of the day.  Pain 3/10 to right breast not taking pain medication. Given a new tube of Radiaplex gel today. BP 113/68 mmHg  Pulse 92  Temp(Src) 98.4 F (36.9 C) (Oral)  Resp 16  Ht 5\' 7"  (1.702 m)  Wt 220 lb 3.2 oz (99.882 kg)  BMI 34.48 kg/m2  SpO2 98%

## 2016-01-11 ENCOUNTER — Ambulatory Visit
Admission: RE | Admit: 2016-01-11 | Discharge: 2016-01-11 | Disposition: A | Discharge status: Home / Self Care | Payer: Medicaid Other | Source: Ambulatory Visit | Attending: Radiation Oncology | Admitting: Radiation Oncology

## 2016-01-11 DIAGNOSIS — Z510 Encounter for antineoplastic radiation therapy: Secondary | ICD-10-CM | POA: Diagnosis not present

## 2016-01-12 ENCOUNTER — Ambulatory Visit
Admission: RE | Admit: 2016-01-12 | Discharge: 2016-01-12 | Disposition: A | Discharge status: Home / Self Care | Payer: Medicaid Other | Source: Ambulatory Visit | Attending: Radiation Oncology | Admitting: Radiation Oncology

## 2016-01-12 DIAGNOSIS — Z510 Encounter for antineoplastic radiation therapy: Secondary | ICD-10-CM | POA: Diagnosis not present

## 2016-01-15 ENCOUNTER — Ambulatory Visit
Admission: RE | Admit: 2016-01-15 | Discharge: 2016-01-15 | Disposition: A | Discharge status: Home / Self Care | Payer: Medicaid Other | Source: Ambulatory Visit | Attending: Radiation Oncology | Admitting: Radiation Oncology

## 2016-01-15 DIAGNOSIS — Z510 Encounter for antineoplastic radiation therapy: Secondary | ICD-10-CM | POA: Diagnosis not present

## 2016-01-16 ENCOUNTER — Ambulatory Visit (HOSPITAL_COMMUNITY)
Admission: RE | Admit: 2016-01-16 | Discharge: 2016-01-16 | Disposition: A | Discharge status: Home / Self Care | Payer: Medicaid Other | Source: Ambulatory Visit | Attending: Obstetrics and Gynecology | Admitting: Obstetrics and Gynecology

## 2016-01-16 ENCOUNTER — Ambulatory Visit (HOSPITAL_BASED_OUTPATIENT_CLINIC_OR_DEPARTMENT_OTHER)
Admission: RE | Admit: 2016-01-16 | Discharge: 2016-01-16 | Disposition: A | Discharge status: Home / Self Care | Payer: Medicaid Other | Source: Ambulatory Visit | Attending: Internal Medicine | Admitting: Internal Medicine

## 2016-01-16 ENCOUNTER — Ambulatory Visit
Admission: RE | Admit: 2016-01-16 | Discharge: 2016-01-16 | Disposition: A | Discharge status: Home / Self Care | Payer: Medicaid Other | Source: Ambulatory Visit | Attending: Radiation Oncology | Admitting: Radiation Oncology

## 2016-01-16 ENCOUNTER — Encounter (HOSPITAL_COMMUNITY): Payer: Self-pay | Admitting: Internal Medicine

## 2016-01-16 ENCOUNTER — Encounter: Payer: Self-pay | Admitting: Radiation Oncology

## 2016-01-16 VITALS — BP 119/84 | HR 82 | Temp 98.1°F | Resp 16 | Wt 222.6 lb

## 2016-01-16 VITALS — BP 122/72 | HR 72 | Wt 221.0 lb

## 2016-01-16 DIAGNOSIS — I340 Nonrheumatic mitral (valve) insufficiency: Secondary | ICD-10-CM | POA: Insufficient documentation

## 2016-01-16 DIAGNOSIS — C50511 Malignant neoplasm of lower-outer quadrant of right female breast: Secondary | ICD-10-CM

## 2016-01-16 DIAGNOSIS — Z510 Encounter for antineoplastic radiation therapy: Secondary | ICD-10-CM | POA: Diagnosis not present

## 2016-01-16 DIAGNOSIS — Z5111 Encounter for antineoplastic chemotherapy: Secondary | ICD-10-CM | POA: Diagnosis not present

## 2016-01-16 LAB — ECHOCARDIOGRAM COMPLETE
EERAT: 4.95
EWDT: 236 ms
FS: 30 % (ref 28–44)
IVS/LV PW RATIO, ED: 1.29
LA ID, A-P, ES: 34 mm
LA diam end sys: 34 mm
LA diam index: 1.6 cm/m2
LA vol A4C: 43.2 ml
LA vol index: 21.1 mL/m2
LA vol: 44.8 mL
LV E/e' medial: 4.95
LV E/e'average: 4.95
LV TDI E'LATERAL: 14.5
LV TDI E'MEDIAL: 7.29
LV e' LATERAL: 14.5 cm/s
LVOT area: 3.46 cm2
LVOTD: 21 mm
MV Dec: 236
MV pk A vel: 63.5 m/s
MVPG: 2 mmHg
MVPKEVEL: 71.8 m/s
PW: 7.58 mm — AB (ref 0.6–1.1)
TAPSE: 16.9 mm
WEIGHTICAEL: 3561.6 [oz_av]

## 2016-01-16 NOTE — Progress Notes (Signed)
CARDIO-ONCOLOGY CLINIC CONSULT NOTE  Referring Physician: Magrinat   HPI:  Phyllis Wiggins is a 45 y.o. [place] woman with breast cancer who is referred to cardio-oncology clinic by Dr. Jana Hakim.  She is status post left breast biopsy 06/15/2015 for a clinical T1 cN0, stage IA invasive ductal carcinoma, grade 3, estrogen and progesterone receptor negative, HER-2 amplified, with an MIB-1 of 40%  (1) neoadjuvant chemotherapy to consist of carboplatin, docetaxel, pertuzumab, and trastuzumab with onpro support every 3 weeks  5 started 07/11/2015, last dose T/P 10/25/2015  (a) carboplatin and docetaxel stopped after 5 cycles due to patient intolerance  (2) Status post right lumpectomy and sentinel lymph node sampling for 01/23/2016 showing a complete pathologic response in the breast and 3 sentinel lymph nodes sampled, ypT0 ypN0  (3) adjuvant radiation to follow   (4) trastuzumab started 11/14/2015, to be continued to mid December 2017  (a) echocardiogram 10/13/2015 shows an ejection fraction of 55%  (5) genetics testing 08/16/2015 through the Breast/Ovarian gene panel offered by [facility] i found no deleterious mutations  No h/o known cardiac disease. Tolerating Herceptin without problem. Only major side effects is brittle nails. No SOB or swelling. Gets tired more easily.   Echo 12/16: EF 55-60% RV normal  Lat s' 10.6 GLS -19.8 Echo 6/17: EF 50-55% RV normal Lat s' 10.95 GLS - 21.4 (no 4 chamber)  Review of Systems: [y] = yes, _0  = no   General: Weight gain _1 ; Weight loss _2 ; Anorexia _3 ; Fatigue _4 ; Fever _5 ; Chills _6 ; Weakness _7   Cardiac: Chest pain/pressure _8 ; Resting SOB _9 ; Exertional SOB _10 ; Orthopnea _11 ; Pedal Edema _12 ; Palpitations _13 ; Syncope _14 ; Presyncope _15 ; Paroxysmal nocturnal dyspnea_16   Pulmonary: Cough _17 ; Wheezing_18 ; Hemoptysis_19 ; Sputum _20 ; Snoring _21   GI: Vomiting_22 ; Dysphagia_23 ; Melena_24 ; Hematochezia _25 ; Heartburn_26 ; Abdominal pain _27 ;  Constipation _28 ; Diarrhea _29 ; BRBPR _30   GU: Hematuria_31 ; Dysuria _32 ; Nocturia_33   Vascular: Pain in legs with walking _34 ; Pain in feet with lying flat _35 ; Non-healing sores _36 ; Stroke _37 ; TIA _38 ; Slurred speech _39 ;  Neuro: Headaches_40 ; Vertigo_41 ; Seizures_42 ; Paresthesias_43 ;Blurred vision _44 ; Diplopia _45 ; Vision changes _46   Ortho/Skin: Arthritis _47 ; Joint pain _48 ; Muscle pain _49 ; Joint swelling _50 ; Back Pain _51 ; Rash _52   Psych: Depression_53 ; Anxiety_54   Heme: Bleeding problems _55 ; Clotting disorders _56 ; Anemia _57   Endocrine: Diabetes _58 ; Thyroid dysfunction_59    Past Medical History  Diagnosis Date  . Breast cancer of lower-outer quadrant of right female breast ([place]) 06/20/2015  . Breast cancer ([place])   . Arthritis   . Family history of stomach cancer     Current Outpatient Prescriptions  Medication Sig Dispense Refill  . hyaluronate sodium (RADIAPLEXRX) GEL Apply 1 application topically once.    . lidocaine-prilocaine (EMLA) cream Apply to affected area once 30 g 3  . ibuprofen (ADVIL,MOTRIN) 200 MG tablet Take 200 mg by mouth every 6 (six) hours as needed for mild pain. Reported on 01/16/2016    . meclizine (ANTIVERT) 25 MG tablet Take 25 mg by mouth as needed for dizziness. Reported on 01/16/2016    . oxyCODONE-acetaminophen (ROXICET) 5-325 MG tablet Take 1-2 tablets by mouth every 4 (four) hours as needed. (Patient  not taking: Reported on 12/19/2015) 50 tablet 0   No current facility-administered medications for this encounter.    Allergies  Allergen Reactions  . Cheese   . Dairy Aid [Lactase]     "sinus infections"  . Whey     Sinus issues  . Wine [Alcohol]     Severe sinus issues due to fermentation      Social History   Social History  . Marital Status: Divorced    Spouse Name: N/A  . Number of Children: 0  . Years of Education: N/A   Occupational History  . Not on file.   Social History Main Topics  . Smoking status: Former Smoker      Types: Cigarettes    Quit date: 07/04/2010  . Smokeless tobacco: Never Used  . Alcohol Use: No  . Drug Use: No  . Sexual Activity: Not Currently    Birth Control/ Protection: None   Other Topics Concern  . Not on file   Social History Narrative      Family History  Problem Relation Age of Onset  . Diabetes Mother   . Hypertension Mother   . Hyperlipidemia Mother   . Stomach cancer Maternal Grandfather 41  . Diabetes Paternal Grandmother   . Dementia Maternal Uncle   . Congestive Heart Failure Maternal Grandmother     Filed Vitals:   01/16/16 1040  BP: 122/72  Pulse: 72  Weight: 221 lb (100.245 kg)  SpO2: 100%    PHYSICAL EXAM: General:  Well appearing. No respiratory difficulty HEENT: normal Neck: supple. no JVD. Carotids 2+ bilat; no bruits. No lymphadenopathy or thryomegaly appreciated. Cor: PMI nondisplaced. Regular rate & rhythm. No rubs, gallops or murmurs. Lungs: clear Abdomen: obese soft, nontender, nondistended. No hepatosplenomegaly. No bruits or masses. Good bowel sounds. Extremities: no cyanosis, clubbing, rash, trace edema Neuro: alert & oriented x 3, cranial nerves grossly intact. moves all 4 extremities w/o difficulty. Affect pleasant.     ASSESSMENT & PLAN:  1. Breast CA (ER/PR -/HER 2-neu +)  Explained incidence of Herceptin cardiotoxicity and role of Cardio-oncology clinic at length. Echo images reviewed personally. EF today 50-55% (slightly decreased) other parameters stable. I suspect it is ok. Will continue herceptin. Repeat echo in 2 months (instead of 3) to ensure stability.   Bensimhon, Daniel,MD 12:36 PM

## 2016-01-16 NOTE — Progress Notes (Signed)
Weekly rad txs right breast 21/33 completed . Erythema only,skin intact, occasional zaps and pain in breast resolves quickly,  Using radiaplex bid, will get Herceptin infusion tomorrow, having hot flashes and night sweats 7:56 AM BP 119/84 mmHg  Pulse 82  Temp(Src) 98.1 F (36.7 C) (Oral)  Resp 16  Wt 222 lb 9.6 oz (100.971 kg)  Wt Readings from Last 3 Encounters:  01/16/16 222 lb 9.6 oz (100.971 kg)  01/10/16 220 lb 3.2 oz (99.882 kg)  01/08/16 224 lb 3.2 oz (101.696 kg)

## 2016-01-16 NOTE — Progress Notes (Signed)
  Echocardiogram 2D Echocardiogram has been performed.  Phyllis Wiggins M 01/16/2016, 10:29 AM

## 2016-01-16 NOTE — Progress Notes (Addendum)
Weekly Management Note Current Dose:  45 Gy  Projected Dose: 16 Gy   Narrative:  The patient presents for routine under treatment assessment.  CBCT/MVCT images/Port film x-rays were reviewed.  The chart was checked. Ordered aloe form [employer].   Physical Findings: Weight: 222 lb 9.6 oz (100.971 kg). Unchanged. No moist desquamation. Breast is pink.   Impression:  The patient is tolerating radiation.  Plan:  Continue treatment as planned.  Continue radiaplex.

## 2016-01-16 NOTE — Patient Instructions (Signed)
Follow up and Echo with Dr.Bensimhon in 2 months

## 2016-01-17 ENCOUNTER — Ambulatory Visit
Admission: RE | Admit: 2016-01-17 | Discharge: 2016-01-17 | Disposition: A | Discharge status: Home / Self Care | Payer: Medicaid Other | Source: Ambulatory Visit | Attending: Radiation Oncology | Admitting: Radiation Oncology

## 2016-01-17 ENCOUNTER — Ambulatory Visit (HOSPITAL_BASED_OUTPATIENT_CLINIC_OR_DEPARTMENT_OTHER): Payer: Medicaid Other

## 2016-01-17 ENCOUNTER — Other Ambulatory Visit (HOSPITAL_BASED_OUTPATIENT_CLINIC_OR_DEPARTMENT_OTHER): Payer: Medicaid Other

## 2016-01-17 VITALS — BP 131/78 | HR 82 | Temp 98.0°F | Resp 18

## 2016-01-17 DIAGNOSIS — C50511 Malignant neoplasm of lower-outer quadrant of right female breast: Secondary | ICD-10-CM

## 2016-01-17 DIAGNOSIS — Z510 Encounter for antineoplastic radiation therapy: Secondary | ICD-10-CM | POA: Diagnosis not present

## 2016-01-17 DIAGNOSIS — Z5112 Encounter for antineoplastic immunotherapy: Secondary | ICD-10-CM

## 2016-01-17 LAB — COMPREHENSIVE METABOLIC PANEL
ALT: 24 U/L (ref 0–55)
ANION GAP: 9 meq/L (ref 3–11)
AST: 19 U/L (ref 5–34)
Albumin: 3.6 g/dL (ref 3.5–5.0)
Alkaline Phosphatase: 64 U/L (ref 40–150)
BUN: 13.1 mg/dL (ref 7.0–26.0)
CALCIUM: 9.9 mg/dL (ref 8.4–10.4)
CHLORIDE: 105 meq/L (ref 98–109)
CO2: 26 mEq/L (ref 22–29)
Creatinine: 0.9 mg/dL (ref 0.6–1.1)
EGFR: 74 mL/min/{1.73_m2} — AB (ref >90)
Glucose: 130 mg/dl (ref 70–140)
POTASSIUM: 4.2 meq/L (ref 3.5–5.1)
SODIUM: 140 meq/L (ref 136–145)
Total Bilirubin: 0.32 mg/dL (ref 0.20–1.20)
Total Protein: 7.3 g/dL (ref 6.4–8.3)

## 2016-01-17 MED ORDER — SODIUM CHLORIDE 0.9 % IV SOLN
6.0000 mg/kg | Freq: Once | INTRAVENOUS | Status: AC
  Administered 2016-01-17: 567 mg via INTRAVENOUS
  Filled 2016-01-17: qty 27

## 2016-01-17 MED ORDER — ACETAMINOPHEN 325 MG PO TABS
650.0000 mg | ORAL_TABLET | Freq: Once | ORAL | Status: AC
  Administered 2016-01-17: 650 mg via ORAL

## 2016-01-17 MED ORDER — DIPHENHYDRAMINE HCL 25 MG PO CAPS
ORAL_CAPSULE | ORAL | Status: AC
  Filled 2016-01-17: qty 1

## 2016-01-17 MED ORDER — ACETAMINOPHEN 325 MG PO TABS
ORAL_TABLET | ORAL | Status: AC
  Filled 2016-01-17: qty 2

## 2016-01-17 MED ORDER — HEPARIN SOD (PORK) LOCK FLUSH 100 UNIT/ML IV SOLN
500.0000 [IU] | Freq: Once | INTRAVENOUS | Status: AC | PRN
  Administered 2016-01-17: 500 [IU]
  Filled 2016-01-17: qty 5

## 2016-01-17 MED ORDER — SODIUM CHLORIDE 0.9 % IV SOLN
Freq: Once | INTRAVENOUS | Status: AC
  Administered 2016-01-17: 10:00:00 via INTRAVENOUS

## 2016-01-17 MED ORDER — SODIUM CHLORIDE 0.9% FLUSH
10.0000 mL | INTRAVENOUS | Status: DC | PRN
  Administered 2016-01-17: 10 mL
  Filled 2016-01-17: qty 10

## 2016-01-17 MED ORDER — DIPHENHYDRAMINE HCL 25 MG PO CAPS
25.0000 mg | ORAL_CAPSULE | Freq: Once | ORAL | Status: AC
  Administered 2016-01-17: 25 mg via ORAL

## 2016-01-17 NOTE — Patient Instructions (Signed)
Trastuzumab injection for infusion What is this medicine? TRASTUZUMAB (tras TOO zoo mab) is a monoclonal antibody. It is used to treat breast cancer and stomach cancer. This medicine may be used for other purposes; ask your health care provider or pharmacist if you have questions. What should I tell my health care provider before I take this medicine? They need to know if you have any of these conditions: -heart disease -heart failure -infection (especially a virus infection such as chickenpox, cold sores, or herpes) -lung or breathing disease, like asthma -recent or ongoing radiation therapy -an unusual or allergic reaction to trastuzumab, benzyl alcohol, or other medications, foods, dyes, or preservatives -pregnant or trying to get pregnant -breast-feeding How should I use this medicine? This drug is given as an infusion into a vein. It is administered in a hospital or clinic by a specially trained health care professional. Talk to your pediatrician regarding the use of this medicine in children. This medicine is not approved for use in children. Overdosage: If you think you have taken too much of this medicine contact a poison control center or emergency room at once. NOTE: This medicine is only for you. Do not share this medicine with others. What if I miss a dose? It is important not to miss a dose. Call your doctor or health care professional if you are unable to keep an appointment. What may interact with this medicine? -doxorubicin -warfarin This list may not describe all possible interactions. Give your health care provider a list of all the medicines, herbs, non-prescription drugs, or dietary supplements you use. Also tell them if you smoke, drink alcohol, or use illegal drugs. Some items may interact with your medicine. What should I watch for while using this medicine? Visit your doctor for checks on your progress. Report any side effects. Continue your course of treatment even  though you feel ill unless your doctor tells you to stop. Call your doctor or health care professional for advice if you get a fever, chills or sore throat, or other symptoms of a cold or flu. Do not treat yourself. Try to avoid being around people who are sick. You may experience fever, chills and shaking during your first infusion. These effects are usually mild and can be treated with other medicines. Report any side effects during the infusion to your health care professional. Fever and chills usually do not happen with later infusions. Do not become pregnant while taking this medicine or for 7 months after stopping it. Women should inform their doctor if they wish to become pregnant or think they might be pregnant. Women of child-bearing potential will need to have a negative pregnancy test before starting this medicine. There is a potential for serious side effects to an unborn child. Talk to your health care professional or pharmacist for more information. Do not breast-feed an infant while taking this medicine or for 7 months after stopping it. Women must use effective birth control with this medicine. What side effects may I notice from receiving this medicine? Side effects that you should report to your doctor or other health care professional as soon as possible: -breathing difficulties -chest pain or palpitations -cough -dizziness or fainting -fever or chills, sore throat -skin rash, itching or hives -swelling of the legs or ankles -unusually weak or tired Side effects that usually do not require medical attention (report to your doctor or other health care professional if they continue or are bothersome): -loss of appetite -headache -muscle aches -nausea This   list may not describe all possible side effects. Call your doctor for medical advice about side effects. You may report side effects to FDA at 1-800-FDA-1088. Where should I keep my medicine? This drug is given in a hospital  or clinic and will not be stored at home. NOTE: This sheet is a summary. It may not cover all possible information. If you have questions about this medicine, talk to your doctor, pharmacist, or health care provider.    2016, [employer]. (2014-10-28 11:49:32)   

## 2016-01-18 ENCOUNTER — Ambulatory Visit
Admission: RE | Admit: 2016-01-18 | Discharge: 2016-01-18 | Disposition: A | Discharge status: Home / Self Care | Payer: Medicaid Other | Source: Ambulatory Visit | Attending: Radiation Oncology | Admitting: Radiation Oncology

## 2016-01-18 DIAGNOSIS — C50511 Malignant neoplasm of lower-outer quadrant of right female breast: Secondary | ICD-10-CM

## 2016-01-18 DIAGNOSIS — Z510 Encounter for antineoplastic radiation therapy: Secondary | ICD-10-CM | POA: Diagnosis not present

## 2016-01-18 MED ORDER — SONAFINE EX EMUL
1.0000 "application " | Freq: Two times a day (BID) | CUTANEOUS | Status: DC
  Administered 2016-01-18: 1 via TOPICAL

## 2016-01-19 ENCOUNTER — Ambulatory Visit
Admission: RE | Admit: 2016-01-19 | Discharge: 2016-01-19 | Disposition: A | Discharge status: Home / Self Care | Payer: Medicaid Other | Source: Ambulatory Visit | Attending: Radiation Oncology | Admitting: Radiation Oncology

## 2016-01-19 DIAGNOSIS — Z510 Encounter for antineoplastic radiation therapy: Secondary | ICD-10-CM | POA: Diagnosis not present

## 2016-01-22 ENCOUNTER — Ambulatory Visit
Admission: RE | Admit: 2016-01-22 | Discharge: 2016-01-22 | Disposition: A | Discharge status: Home / Self Care | Payer: Medicaid Other | Source: Ambulatory Visit | Attending: Radiation Oncology | Admitting: Radiation Oncology

## 2016-01-22 DIAGNOSIS — Z510 Encounter for antineoplastic radiation therapy: Secondary | ICD-10-CM | POA: Diagnosis not present

## 2016-01-22 DIAGNOSIS — C50511 Malignant neoplasm of lower-outer quadrant of right female breast: Secondary | ICD-10-CM

## 2016-01-22 MED ORDER — SONAFINE EX EMUL
1.0000 "application " | Freq: Two times a day (BID) | CUTANEOUS | Status: DC
  Administered 2016-01-22: 1 via TOPICAL

## 2016-01-23 ENCOUNTER — Ambulatory Visit: Payer: Medicaid Other

## 2016-01-23 ENCOUNTER — Ambulatory Visit
Admission: RE | Admit: 2016-01-23 | Discharge: 2016-01-23 | Disposition: A | Discharge status: Home / Self Care | Payer: Medicaid Other | Source: Ambulatory Visit | Attending: Radiation Oncology | Admitting: Radiation Oncology

## 2016-01-23 ENCOUNTER — Encounter: Payer: Self-pay | Admitting: Radiation Oncology

## 2016-01-23 VITALS — BP 122/80 | HR 81 | Temp 98.4°F | Resp 18 | Ht 67.0 in | Wt 223.0 lb

## 2016-01-23 DIAGNOSIS — C50511 Malignant neoplasm of lower-outer quadrant of right female breast: Secondary | ICD-10-CM

## 2016-01-23 DIAGNOSIS — Z510 Encounter for antineoplastic radiation therapy: Secondary | ICD-10-CM | POA: Diagnosis not present

## 2016-01-23 NOTE — Progress Notes (Signed)
Weekly rad txs right breast 26 fractions completed . Erythema only,skin intact, occasional zaps and pain in breast 4/10 resolves quickly, not taking any pain medication.  Using Sonafine bid.   will get Herceptin infusion July 5, having hot flashes and night sweats.  Appetite is good.  Having fatigue during the day. BP 122/80 mmHg  Pulse 81  Temp(Src) 98.4 F (36.9 C) (Oral)  Resp 18  Ht 5\' 7"  (1.702 m)  Wt 223 lb (101.152 kg)  BMI 34.92 kg/m2  SpO2 98%

## 2016-01-23 NOTE — Progress Notes (Signed)
     Department of Radiation Oncology  Phone:  [phone] Fax:        [phone]  01/23/16  Weekly Management Note Current Dose:  47 Gy  Projected Dose: 61 Gy   Narrative:  The patient presents for routine under treatment assessment.  CBCT/MVCT images/Port film x-rays were reviewed.  The chart was checked.  Weekly rad txs right breast 26 fractions completed . Erythema only,skin intact, occasional zaps and pain in breast 4/10 resolves quickly, not taking any pain medication.  Using Sonafine bid. will get Herceptin infusion July 5, having hot flashes and night sweats. Appetite is good. Having fatigue during the day.  Physical Findings: Weight: 223 lb (101.152 kg). Unchanged. Right breast with hyperpigmentation and erythema.   Impression:  The patient is tolerating radiation.  Plan:  Continue treatment as planned.  Continue Sonafine. Advised to use pure aloe vera gel as well.   -----------------------------------  Blair Promise, PhD, MD  This document serves as a record of services personally performed by Gery Pray, MD. It was created on his behalf by Derek Mound, a trained medical scribe. The creation of this record is based on the scribe's personal observations and the provider's statements to them. This document has been checked and approved by the attending provider.

## 2016-01-24 ENCOUNTER — Ambulatory Visit
Admission: RE | Admit: 2016-01-24 | Discharge: 2016-01-24 | Disposition: A | Discharge status: Home / Self Care | Payer: Medicaid Other | Source: Ambulatory Visit | Attending: Radiation Oncology | Admitting: Radiation Oncology

## 2016-01-24 DIAGNOSIS — Z510 Encounter for antineoplastic radiation therapy: Secondary | ICD-10-CM | POA: Diagnosis not present

## 2016-01-25 ENCOUNTER — Ambulatory Visit
Admission: RE | Admit: 2016-01-25 | Discharge: 2016-01-25 | Disposition: A | Discharge status: Home / Self Care | Payer: Medicaid Other | Source: Ambulatory Visit

## 2016-01-25 DIAGNOSIS — Z510 Encounter for antineoplastic radiation therapy: Secondary | ICD-10-CM | POA: Diagnosis not present

## 2016-01-26 ENCOUNTER — Ambulatory Visit
Admission: RE | Admit: 2016-01-26 | Discharge: 2016-01-26 | Disposition: A | Discharge status: Home / Self Care | Payer: Medicaid Other | Source: Ambulatory Visit

## 2016-01-26 DIAGNOSIS — Z510 Encounter for antineoplastic radiation therapy: Secondary | ICD-10-CM | POA: Diagnosis not present

## 2016-01-26 NOTE — Progress Notes (Addendum)
  Weekly Management Note Current Dose: 55 Gy  Projected Dose: 61 Gy    ICD-9-CM ICD-10-CM   1. Breast cancer of lower-outer quadrant of right female breast ([facility]) 174.5 C50.511      Narrative:  The patient presents for routine under treatment assessment.  CBCT/MVCT images/Port film x-rays were reviewed.  The chart was checked.   Doing well, applying neosporin to peeling at IM fold on right  Physical Findings: Weight: 223 lb 11.2 oz (101.47 kg).  Erythema over right breast,  with moist peeling at IM fold.  Dry in axilla on right.   Filed Vitals:   01/29/16 1252  BP: 117/74  Pulse: 75  Temp: 97.9 F (36.6 C)  Resp: 18    Impression:  The patient is tolerating radiation.  Plan:  Continue treatment as planned. Apply sonafine to dry skin, neosporin to peeling skin. -----------------------------------  Eppie Gibson, MD

## 2016-01-29 ENCOUNTER — Ambulatory Visit
Admission: RE | Admit: 2016-01-29 | Discharge: 2016-01-29 | Disposition: A | Discharge status: Home / Self Care | Payer: Medicaid Other | Source: Ambulatory Visit

## 2016-01-29 ENCOUNTER — Ambulatory Visit
Admission: RE | Admit: 2016-01-29 | Discharge: 2016-01-29 | Disposition: A | Discharge status: Home / Self Care | Payer: Medicaid Other | Source: Ambulatory Visit | Attending: Radiation Oncology | Admitting: Radiation Oncology

## 2016-01-29 ENCOUNTER — Encounter: Payer: Self-pay | Admitting: Radiation Oncology

## 2016-01-29 VITALS — BP 117/74 | HR 75 | Temp 97.9°F | Resp 18 | Ht <= 58 in | Wt 223.7 lb

## 2016-01-29 DIAGNOSIS — Z510 Encounter for antineoplastic radiation therapy: Secondary | ICD-10-CM | POA: Diagnosis not present

## 2016-01-29 DIAGNOSIS — C50511 Malignant neoplasm of lower-outer quadrant of right female breast: Secondary | ICD-10-CM

## 2016-01-29 NOTE — Progress Notes (Signed)
Weekly rad txs right breast 30 fractions completed . Moist desquamation right breast and inframammary ,skin intact,given telfa pads to use.  Having  occasional zaps and pain in breast 2/10 resolves quickly taking Tylenol.  Using Sonafine bid. will get Herceptin infusion July 5, having hot flashes and night sweats. Appetite is good. Having fatigue during the day, getting better.  EOT education done. BP 117/74 mmHg  Pulse 75  Temp(Src) 97.9 F (36.6 C) (Oral)  Resp 18  Ht 5.7" (0.145 m)  Wt 223 lb 11.2 oz (101.47 kg)  BMI 4826.16 kg/m2  SpO2 98%

## 2016-01-30 ENCOUNTER — Ambulatory Visit
Admission: RE | Admit: 2016-01-30 | Discharge: 2016-01-30 | Disposition: A | Discharge status: Home / Self Care | Payer: Medicaid Other | Source: Ambulatory Visit

## 2016-01-30 DIAGNOSIS — Z510 Encounter for antineoplastic radiation therapy: Secondary | ICD-10-CM | POA: Diagnosis not present

## 2016-01-31 ENCOUNTER — Encounter: Payer: Self-pay | Admitting: Radiation Oncology

## 2016-01-31 ENCOUNTER — Ambulatory Visit
Admission: RE | Admit: 2016-01-31 | Discharge: 2016-01-31 | Disposition: A | Discharge status: Home / Self Care | Payer: Medicaid Other | Source: Ambulatory Visit

## 2016-01-31 ENCOUNTER — Ambulatory Visit
Admission: RE | Admit: 2016-01-31 | Discharge: 2016-01-31 | Disposition: A | Discharge status: Home / Self Care | Payer: Medicaid Other | Source: Ambulatory Visit | Attending: Radiation Oncology | Admitting: Radiation Oncology

## 2016-01-31 VITALS — BP 118/79 | HR 78 | Temp 98.1°F | Resp 18 | Ht 67.0 in | Wt 225.0 lb

## 2016-01-31 DIAGNOSIS — C50511 Malignant neoplasm of lower-outer quadrant of right female breast: Secondary | ICD-10-CM | POA: Diagnosis not present

## 2016-01-31 DIAGNOSIS — Z510 Encounter for antineoplastic radiation therapy: Secondary | ICD-10-CM | POA: Diagnosis not present

## 2016-01-31 MED ORDER — SONAFINE EX EMUL
1.0000 "application " | Freq: Two times a day (BID) | CUTANEOUS | Status: DC
  Administered 2016-01-31: 1 via TOPICAL

## 2016-01-31 NOTE — Progress Notes (Signed)
Weekly rad txs right breast 32 fractions completed . Moist desquamation right breast and inframammary ,skin intact,given telfa pads to use. Having occasional zaps and pain in breast 0/10 resolves quickly taking Tylenol.  Using Sonafine bid. will get Herceptin infusion July 5, having hot flashes and night sweats. Appetite is good. Having fatigue during the day, getting better. EOT education done 01-29-16.  New Sonafine given today. BP 118/79 mmHg  Pulse 78  Temp(Src) 98.1 F (36.7 C) (Oral)  Resp 18  Ht 5\' 7"  (1.702 m)  Wt 225 lb (102.059 kg)  BMI 35.23 kg/m2  SpO2 96%

## 2016-01-31 NOTE — Progress Notes (Signed)
  Weekly Management Note Current Dose: 59 Gy  Projected Dose: 61 Gy    ICD-9-CM ICD-10-CM   1. Breast cancer of lower-outer quadrant of right female breast (HCC) 174.5 C50.511 SONAFINE emulsion 1 application     Narrative:  The patient presents for routine under treatment assessment.  CBCT/MVCT images/Port film x-rays were reviewed.  The chart was checked.   Doing well, applying neosporin to peeling at IM fold on right.  Provided Telfa pads by nursing.   Physical Findings: Weight: 225 lb (102.059 kg).  Erythema over right breast,  With improved moist peeling at IM fold.  Dry in axilla on right.   Filed Vitals:   01/31/16 1256  BP: 118/79  Pulse: 78  Temp: 98.1 F (36.7 C)  Resp: 18    Impression:  The patient is tolerating radiation.  Plan:  Continue treatment as planned. Continue to apply sonafine to dry skin, neosporin to peeling skin.  F/u in 27mo. -----------------------------------  Eppie Gibson, MD

## 2016-02-01 ENCOUNTER — Encounter: Payer: Self-pay | Admitting: Radiation Oncology

## 2016-02-01 ENCOUNTER — Ambulatory Visit
Admission: RE | Admit: 2016-02-01 | Discharge: 2016-02-01 | Disposition: A | Discharge status: Home / Self Care | Payer: Medicaid Other | Source: Ambulatory Visit

## 2016-02-01 DIAGNOSIS — Z510 Encounter for antineoplastic radiation therapy: Secondary | ICD-10-CM | POA: Diagnosis not present

## 2016-02-05 ENCOUNTER — Other Ambulatory Visit: Payer: Self-pay

## 2016-02-05 DIAGNOSIS — C50511 Malignant neoplasm of lower-outer quadrant of right female breast: Secondary | ICD-10-CM

## 2016-02-07 ENCOUNTER — Telehealth: Payer: Self-pay | Admitting: Oncology

## 2016-02-07 ENCOUNTER — Ambulatory Visit (HOSPITAL_BASED_OUTPATIENT_CLINIC_OR_DEPARTMENT_OTHER): Payer: Medicaid Other

## 2016-02-07 ENCOUNTER — Ambulatory Visit (HOSPITAL_BASED_OUTPATIENT_CLINIC_OR_DEPARTMENT_OTHER): Payer: Medicaid Other | Admitting: Oncology

## 2016-02-07 ENCOUNTER — Other Ambulatory Visit (HOSPITAL_BASED_OUTPATIENT_CLINIC_OR_DEPARTMENT_OTHER): Payer: Medicaid Other

## 2016-02-07 VITALS — BP 115/77 | HR 92 | Temp 97.9°F | Resp 18 | Ht 67.0 in | Wt 223.7 lb

## 2016-02-07 DIAGNOSIS — C50511 Malignant neoplasm of lower-outer quadrant of right female breast: Secondary | ICD-10-CM

## 2016-02-07 DIAGNOSIS — N951 Menopausal and female climacteric states: Secondary | ICD-10-CM

## 2016-02-07 DIAGNOSIS — Z171 Estrogen receptor negative status [ER-]: Secondary | ICD-10-CM | POA: Diagnosis not present

## 2016-02-07 DIAGNOSIS — Z5112 Encounter for antineoplastic immunotherapy: Secondary | ICD-10-CM

## 2016-02-07 LAB — CBC WITH DIFFERENTIAL/PLATELET
BASO%: 0.5 % (ref 0.0–2.0)
Basophils Absolute: 0 10*3/uL (ref 0.0–0.1)
EOS%: 2.1 % (ref 0.0–7.0)
Eosinophils Absolute: 0.1 10*3/uL (ref 0.0–0.5)
HCT: 40 % (ref 34.8–46.6)
HGB: 13.4 g/dL (ref 11.6–15.9)
LYMPH%: 24.6 % (ref 14.0–49.7)
MCH: 29.8 pg (ref 25.1–34.0)
MCHC: 33.6 g/dL (ref 31.5–36.0)
MCV: 88.9 fL (ref 79.5–101.0)
MONO#: 0.3 10*3/uL (ref 0.1–0.9)
MONO%: 4.5 % (ref 0.0–14.0)
NEUT%: 68.3 % (ref 38.4–76.8)
NEUTROS ABS: 4.1 10*3/uL (ref 1.5–6.5)
Platelets: 252 10*3/uL (ref 145–400)
RBC: 4.5 10*6/uL (ref 3.70–5.45)
RDW: 12.4 % (ref 11.2–14.5)
WBC: 5.9 10*3/uL (ref 3.9–10.3)
lymph#: 1.5 10*3/uL (ref 0.9–3.3)

## 2016-02-07 LAB — COMPREHENSIVE METABOLIC PANEL
ALT: 26 U/L (ref 0–55)
AST: 20 U/L (ref 5–34)
Albumin: 3.5 g/dL (ref 3.5–5.0)
Alkaline Phosphatase: 73 U/L (ref 40–150)
Anion Gap: 11 mEq/L (ref 3–11)
BILIRUBIN TOTAL: 0.31 mg/dL (ref 0.20–1.20)
BUN: 11.6 mg/dL (ref 7.0–26.0)
CO2: 25 meq/L (ref 22–29)
CREATININE: 0.9 mg/dL (ref 0.6–1.1)
Calcium: 9.9 mg/dL (ref 8.4–10.4)
Chloride: 104 mEq/L (ref 98–109)
EGFR: 76 mL/min/{1.73_m2} — AB (ref >90)
GLUCOSE: 165 mg/dL — AB (ref 70–140)
Potassium: 3.9 mEq/L (ref 3.5–5.1)
SODIUM: 140 meq/L (ref 136–145)
TOTAL PROTEIN: 7.4 g/dL (ref 6.4–8.3)

## 2016-02-07 MED ORDER — ACETAMINOPHEN 325 MG PO TABS
650.0000 mg | ORAL_TABLET | Freq: Once | ORAL | Status: AC
  Administered 2016-02-07: 650 mg via ORAL

## 2016-02-07 MED ORDER — DIPHENHYDRAMINE HCL 25 MG PO CAPS
ORAL_CAPSULE | ORAL | Status: AC
  Filled 2016-02-07: qty 1

## 2016-02-07 MED ORDER — SODIUM CHLORIDE 0.9 % IV SOLN
Freq: Once | INTRAVENOUS | Status: AC
  Administered 2016-02-07: 10:00:00 via INTRAVENOUS

## 2016-02-07 MED ORDER — DIPHENHYDRAMINE HCL 25 MG PO CAPS
25.0000 mg | ORAL_CAPSULE | Freq: Once | ORAL | Status: AC
Start: 2016-02-07 — End: 2016-02-07
  Administered 2016-02-07: 25 mg via ORAL

## 2016-02-07 MED ORDER — TRASTUZUMAB CHEMO INJECTION 440 MG
6.0000 mg/kg | Freq: Once | INTRAVENOUS | Status: AC
  Administered 2016-02-07: 567 mg via INTRAVENOUS
  Filled 2016-02-07: qty 27

## 2016-02-07 MED ORDER — GABAPENTIN 300 MG PO CAPS: 300.0000 mg | ORAL_CAPSULE | Freq: Every day | ORAL | Status: DC

## 2016-02-07 MED ORDER — SODIUM CHLORIDE 0.9% FLUSH
10.0000 mL | INTRAVENOUS | Status: DC | PRN
  Administered 2016-02-07: 10 mL
  Filled 2016-02-07: qty 10

## 2016-02-07 MED ORDER — HEPARIN SOD (PORK) LOCK FLUSH 100 UNIT/ML IV SOLN
500.0000 [IU] | Freq: Once | INTRAVENOUS | Status: AC | PRN
  Administered 2016-02-07: 500 [IU]
  Filled 2016-02-07: qty 5

## 2016-02-07 MED ORDER — LIDOCAINE-PRILOCAINE 2.5-2.5 % EX CREA: TOPICAL_CREAM | CUTANEOUS | Status: DC

## 2016-02-07 MED ORDER — ACETAMINOPHEN 325 MG PO TABS
ORAL_TABLET | ORAL | Status: AC
  Filled 2016-02-07: qty 2

## 2016-02-07 MED FILL — LIDOCAINE-PRILOCAINE CREAM: 2.5-2.5 | 10 days supply | Qty: 30 | Fill #0

## 2016-02-07 NOTE — Progress Notes (Addendum)
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 She brought it to medical attention when she presented for the free Pap smear screening clinic here 06/01/2015. Exam on that day showed a lump in the right breast at the 4:00 position under the areola. There was also a scar on the right outer breast. This scar was noted in 01/04/2008 when she had her baseline mammogram at [facility]. It had been stable for a year at that time and the patient had treated it with a "blood root paste" which caused scarring. 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 It is in a separate location from the new mass.  On 06/05/2015 Phyllis Wiggins underwent bilateral diagnostic mammography at [facility], with bilateral ultrasonography. This found the breast density to be category B. There was a 2.5 cm oval mass in the right breast at the 5:00 position which by ultrasound measured 2.1 cm. In the left breast there was an area of focal asymmetry which by ultrasound was consistent with fibroglandular tissue/benign.  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  Her subsequent history is as detailed below.  INTERVAL HISTORY: Phyllis Wiggins returns today for follow-up of her HER-2/neu positive breast cancer. The interval history is significant for her having completed her radiation treatments a week ago. It was somewhat painful because of skin changes for the last 2 weeks of treatment, but "it is getting better". She was also significantly fatigued, but is now working full-time at her business, [employer]. She is no longer needing to take afternoon naps  She continues on trastuzumab every 3 weeks, with a dose due today. She tolerates that with no side effects that she is aware of. Her most recent echocardiogram 01/16/2016 showed a well-preserved ejection fraction  REVIEW OF SYSTEMS:  Phyllis Wiggins has not had a period since January 2017, after starting chemotherapy. She is having hot flashes. They tend to wake her up 2 or 3 times a night. The daytime hot flashes are getting a bit better. Aside from these issues, she has some nail changes which are slowly growing out. She has seasonal allergies. She has some joint aches here and there which are not more intense or persistent than before she is concerned about anxiety and depression but "not at this time". Aside from these issues a detailed review of systems today was noncontributory  PAST MEDICAL HISTORY: Past Medical History  Diagnosis Date  . Breast cancer of lower-outer quadrant of right female breast ([place]) 06/20/2015  . Breast cancer ([place])   . Arthritis   . Family history of stomach cancer  PAST SURGICAL HISTORY: Past Surgical History  Procedure Laterality Date  . Wisdom tooth extraction    . Portacath placement Left 07/06/2015    Procedure: INSERTION PORT-A-CATH;  Surgeon: Autumn Messing III, MD;  Location: [facility];  Service:  General;  Laterality: Left;  . Breast lumpectomy with radioactive seed and sentinel lymph node biopsy Right 11/09/2015    Procedure: RIGHT BREAST LUMPECTOMY WITH RADIOACTIVE SEED AND SENTINEL LYMPH NODE BIOPSY AND EXCISION OF RIGHT BREAST FIBROADENOMA;  Surgeon: Autumn Messing III, MD;  Location: [facility];  Service: General;  Laterality: Right;    FAMILY HISTORY Family History  Problem Relation Age of Onset  . Diabetes Mother   . Hypertension Mother   . Hyperlipidemia Mother   . Stomach cancer Maternal Grandfather 41  . Diabetes Paternal Grandmother   . Dementia Maternal Uncle   . Congestive Heart Failure Maternal Grandmother    the patient's parents are both living, in their early 59s. The patient had no brother, one sister. The patient's mother works by [employer] for private clients. The patient's sister is a Pharmacist, hospital in [place] there is no history of breast or ovarian cancer in the family. The patient's paternal grandfather died at the age of 80 from stomach cancer felt to be related to iron unusual toxic exposure   GYNECOLOGIC HISTORY:  No LMP recorded. Patient is not currently having periods (Reason: Chemotherapy).  menarche age 64, she is [place] P0  she is still having periods but they're quite sporadic. They may occur 6 weeks apart or 3 weeks apart, may be heavy or only spotting. 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 She is divorced and at home lives with 6 dogs.     ADVANCED DIRECTIVES:  not in place    HEALTH MAINTENANCE: Social History  Substance Use Topics  . Smoking status: Former Smoker    Types: Cigarettes    Quit date: 07/04/2010  . Smokeless tobacco: Never Used  . Alcohol Use: No     Colonoscopy:  PAP: October 2016   Bone  density:  Lipid panel:  Allergies  Allergen Reactions  . Cheese   . Dairy Aid [Lactase]     "sinus infections"  . Whey     Sinus issues  . 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 Biotin 10 MG CAPS Take by mouth. Reported on 01/31/2016    . lidocaine-prilocaine (EMLA) cream Apply to affected area once 30 g 3  . meclizine (ANTIVERT) 25 MG tablet Take 25 mg by mouth as needed for dizziness. Reported on 01/31/2016    . acetaminophen (TYLENOL) 650 MG CR tablet Take 650 mg by mouth every 8 (eight) hours as needed for pain.    Marland Kitchen gabapentin (NEURONTIN) 300 MG capsule Take 1 capsule (300 mg total) by mouth at bedtime. 90 capsule 4  . ibuprofen (ADVIL,MOTRIN) 200 MG tablet Take 200 mg by mouth every 6 (six) hours as needed for mild pain. Reported on 01/31/2016    . Wound Dressings (SONAFINE) Apply 1 application topically 2 (two) times daily.     No current facility-administered medications for this visit.    OBJECTIVE: Middle-aged white woman who appears stated age  Filed Vitals:   02/07/16 0957  BP: 115/77  Pulse: 92  Temp: 97.9 F (36.6 C)  Resp: 18     Body mass index is 35.03 kg/(m^2).    ECOG FS:0 - Asymptomatic   Sclerae unicteric, EOMs intact Oropharynx clear and moist No cervical or supraclavicular adenopathy Lungs no rales or rhonchi Heart regular rate and rhythm Abd soft, obese, nontender, positive bowel sounds MSK no focal spinal tenderness, no right upper extremity lymphedema Neuro: nonfocal, well oriented, positive affect Breasts: The right breast is status post lumpectomy and radiation. There is still hyperpigmentation over the radiation port area, and some erythema over the final boost area. The cosmetic result is good. The right breast is approximately 5-8% smaller than the left, but has preserved the normal breast contour. The right axilla is benign. The left breast is  unremarkable.   LAB RESULTS:  CMP     Component Value Date/Time   NA 140 02/07/2016 0859   K 3.9 02/07/2016 0859   CO2 25 02/07/2016 0859   GLUCOSE 165* 02/07/2016 0859   BUN 11.6 02/07/2016 0859   CREATININE 0.9 02/07/2016 0859   CALCIUM 9.9 02/07/2016 0859   PROT 7.4 02/07/2016 0859   ALBUMIN 3.5 02/07/2016 0859   AST 20 02/07/2016 0859   ALT 26 02/07/2016 0859   ALKPHOS 73 02/07/2016 0859   BILITOT 0.31 02/07/2016 0859    INo results found for: SPEP, UPEP  Lab Results  Component Value Date   WBC 5.9 02/07/2016   NEUTROABS 4.1 02/07/2016   HGB 13.4 02/07/2016   HCT 40.0 02/07/2016   MCV 88.9 02/07/2016   PLT 252 02/07/2016      Chemistry      Component Value Date/Time   NA 140 02/07/2016 0859   K 3.9 02/07/2016 0859   CO2 25 02/07/2016 0859   BUN 11.6 02/07/2016 0859   CREATININE 0.9 02/07/2016 0859      Component Value Date/Time   CALCIUM 9.9 02/07/2016 0859   ALKPHOS 73 02/07/2016 0859   AST 20 02/07/2016 0859   ALT 26 02/07/2016 0859   BILITOT 0.31 02/07/2016 0859       No results found for: LABCA2  No components found for: LABCA125  No results for input(s): INR in the last 168 hours.  Urinalysis No results found for: COLORURINE, APPEARANCEUR, LABSPEC, PHURINE, GLUCOSEU, HGBUR, BILIRUBINUR, KETONESUR, PROTEINUR, UROBILINOGEN, NITRITE, LEUKOCYTESUR  STUDIES: No results found.  ASSESSMENT: 45 y.o. BRCA negative [place] woman status post left breast biopsy 06/15/2015 for a clinical T1 cN0, stage IA invasive ductal carcinoma, grade 3, estrogen and progesterone receptor negative, HER-2 amplified, with an MIB-1 of 40%  (1) neoadjuvant chemotherapy to consist of carboplatin, docetaxel, pertuzumab, and trastuzumab with onpro support every 3 weeks 6 started 07/11/2015, last dose T/P 10/25/2015  (a) carboplatin and docetaxel stopped after 5 cycles due to patient intolerance  (2) Status post right lumpectomy and sentinel lymph node sampling for  01/23/2016 showing a complete pathologic response in the breast and 3 sentinel lymph nodes sampled, ypT0 ypN0  (3) adjuvant radiation completed 01/31/2016  (4) trastuzumab started 11/14/2015, to be continued to mid December 2017  (a) echocardiogram 01/16/2016 shows an ejection fraction of 55-60%  (5) genetics testing 08/16/2015 through the Breast/Ovarian gene panel offered by [facility] i found no deleterious mutations in ATM, BARD1, BRCA1, BRCA2, BRIP1, CDH1, CHEK2, EPCAM, FANCC, MLH1, MSH2, MSH6, NBN, PALB2, PMS2, PTEN, RAD51C, RAD51D, TP53, and XRCC2.   PLAN: Phyllis Wiggins Has now completed her local treatment.  We are continuing the trastuzumab through 07/24/2016  She is having hot flashes. I think she would benefit from gabapentin at bedtime and we discussed the possible toxicities, side effects and complications of that agent. I went ahead and placed the prescription for her. I also refilled her EMLA cream.  Her periods stopped at the beginning of chemotherapy. She understands there is a 40% or so possibility of there resuming over the next year. If they don't then she will be officially postmenopausal.  If the daytime hot flashes become more a problem we will start venlafaxine at the next visit, which will be early September.  She would like the port removed as soon as she completes the trastuzumab, and specifically this year so we doesn't go into the next insurance year  She knows to call for any problems that may develop before her next visit.  Chauncey Cruel, MD   02/07/2016 10:16 AM

## 2016-02-07 NOTE — Telephone Encounter (Signed)
[  per pof to sch pt appt-pt to get updated copy b4 leaving trmtroom °

## 2016-02-07 NOTE — Patient Instructions (Signed)
Trastuzumab injection for infusion What is this medicine? TRASTUZUMAB (tras TOO zoo mab) is a monoclonal antibody. It is used to treat breast cancer and stomach cancer. This medicine may be used for other purposes; ask your health care provider or pharmacist if you have questions. What should I tell my health care provider before I take this medicine? They need to know if you have any of these conditions: -heart disease -heart failure -infection (especially a virus infection such as chickenpox, cold sores, or herpes) -lung or breathing disease, like asthma -recent or ongoing radiation therapy -an unusual or allergic reaction to trastuzumab, benzyl alcohol, or other medications, foods, dyes, or preservatives -pregnant or trying to get pregnant -breast-feeding How should I use this medicine? This drug is given as an infusion into a vein. It is administered in a hospital or clinic by a specially trained health care professional. Talk to your pediatrician regarding the use of this medicine in children. This medicine is not approved for use in children. Overdosage: If you think you have taken too much of this medicine contact a poison control center or emergency room at once. NOTE: This medicine is only for you. Do not share this medicine with others. What if I miss a dose? It is important not to miss a dose. Call your doctor or health care professional if you are unable to keep an appointment. What may interact with this medicine? -doxorubicin -warfarin This list may not describe all possible interactions. Give your health care provider a list of all the medicines, herbs, non-prescription drugs, or dietary supplements you use. Also tell them if you smoke, drink alcohol, or use illegal drugs. Some items may interact with your medicine. What should I watch for while using this medicine? Visit your doctor for checks on your progress. Report any side effects. Continue your course of treatment even  though you feel ill unless your doctor tells you to stop. Call your doctor or health care professional for advice if you get a fever, chills or sore throat, or other symptoms of a cold or flu. Do not treat yourself. Try to avoid being around people who are sick. You may experience fever, chills and shaking during your first infusion. These effects are usually mild and can be treated with other medicines. Report any side effects during the infusion to your health care professional. Fever and chills usually do not happen with later infusions. Do not become pregnant while taking this medicine or for 7 months after stopping it. Women should inform their doctor if they wish to become pregnant or think they might be pregnant. Women of child-bearing potential will need to have a negative pregnancy test before starting this medicine. There is a potential for serious side effects to an unborn child. Talk to your health care professional or pharmacist for more information. Do not breast-feed an infant while taking this medicine or for 7 months after stopping it. Women must use effective birth control with this medicine. What side effects may I notice from receiving this medicine? Side effects that you should report to your doctor or other health care professional as soon as possible: -breathing difficulties -chest pain or palpitations -cough -dizziness or fainting -fever or chills, sore throat -skin rash, itching or hives -swelling of the legs or ankles -unusually weak or tired Side effects that usually do not require medical attention (report to your doctor or other health care professional if they continue or are bothersome): -loss of appetite -headache -muscle aches -nausea This   list may not describe all possible side effects. Call your doctor for medical advice about side effects. You may report side effects to FDA at 1-800-FDA-1088. Where should I keep my medicine? This drug is given in a hospital  or clinic and will not be stored at home. NOTE: This sheet is a summary. It may not cover all possible information. If you have questions about this medicine, talk to your doctor, pharmacist, or health care provider.    2016, [employer]. (2014-10-28 11:49:32)   

## 2016-02-09 ENCOUNTER — Telehealth: Payer: Self-pay | Admitting: *Deleted

## 2016-02-09 NOTE — Telephone Encounter (Signed)
  Oncology Nurse Navigator Documentation    Navigator Encounter Type: Telephone (02/09/16 1100) Telephone: [place] Call (02/09/16 1100)         Patient Visit Type: [id number] (02/09/16 1100) Treatment Phase: Final Radiation Tx (02/09/16 1100)  Relate doing well and without complaints.                          Time Spent with Patient: 15 (02/09/16 1100)

## 2016-02-15 MED FILL — GABAPENTIN 300 MG CAPSULE: 300 | 30 days supply | Qty: 30 | Fill #0

## 2016-02-21 ENCOUNTER — Encounter: Payer: Self-pay | Admitting: Radiation Oncology

## 2016-02-28 ENCOUNTER — Other Ambulatory Visit (HOSPITAL_BASED_OUTPATIENT_CLINIC_OR_DEPARTMENT_OTHER): Payer: Medicaid Other

## 2016-02-28 ENCOUNTER — Ambulatory Visit (HOSPITAL_BASED_OUTPATIENT_CLINIC_OR_DEPARTMENT_OTHER): Payer: Medicaid Other

## 2016-02-28 VITALS — BP 107/78 | HR 91 | Temp 98.9°F | Resp 17

## 2016-02-28 DIAGNOSIS — C50511 Malignant neoplasm of lower-outer quadrant of right female breast: Secondary | ICD-10-CM

## 2016-02-28 DIAGNOSIS — Z5112 Encounter for antineoplastic immunotherapy: Secondary | ICD-10-CM

## 2016-02-28 LAB — COMPREHENSIVE METABOLIC PANEL
ALBUMIN: 3.5 g/dL (ref 3.5–5.0)
ALT: 19 U/L (ref 0–55)
ANION GAP: 11 meq/L (ref 3–11)
AST: 15 U/L (ref 5–34)
Alkaline Phosphatase: 82 U/L (ref 40–150)
BILIRUBIN TOTAL: 0.33 mg/dL (ref 0.20–1.20)
BUN: 15.6 mg/dL (ref 7.0–26.0)
CALCIUM: 9.5 mg/dL (ref 8.4–10.4)
CHLORIDE: 105 meq/L (ref 98–109)
CO2: 24 mEq/L (ref 22–29)
CREATININE: 0.9 mg/dL (ref 0.6–1.1)
EGFR: 74 mL/min/{1.73_m2} — ABNORMAL LOW (ref >90)
Glucose: 152 mg/dl — ABNORMAL HIGH (ref 70–140)
Potassium: 4.3 mEq/L (ref 3.5–5.1)
Sodium: 141 mEq/L (ref 136–145)
Total Protein: 7.2 g/dL (ref 6.4–8.3)

## 2016-02-28 LAB — CBC WITH DIFFERENTIAL/PLATELET
BASO%: 0.2 % (ref 0.0–2.0)
BASOS ABS: 0 10*3/uL (ref 0.0–0.1)
EOS%: 2.1 % (ref 0.0–7.0)
Eosinophils Absolute: 0.1 10*3/uL (ref 0.0–0.5)
HEMATOCRIT: 38 % (ref 34.8–46.6)
HEMOGLOBIN: 12.9 g/dL (ref 11.6–15.9)
LYMPH#: 1.6 10*3/uL (ref 0.9–3.3)
LYMPH%: 27.6 % (ref 14.0–49.7)
MCH: 29.7 pg (ref 25.1–34.0)
MCHC: 33.9 g/dL (ref 31.5–36.0)
MCV: 87.6 fL (ref 79.5–101.0)
MONO#: 0.3 10*3/uL (ref 0.1–0.9)
MONO%: 5 % (ref 0.0–14.0)
NEUT#: 3.8 10*3/uL (ref 1.5–6.5)
NEUT%: 65.1 % (ref 38.4–76.8)
PLATELETS: 233 10*3/uL (ref 145–400)
RBC: 4.34 10*6/uL (ref 3.70–5.45)
RDW: 11.9 % (ref 11.2–14.5)
WBC: 5.8 10*3/uL (ref 3.9–10.3)

## 2016-02-28 MED ORDER — DIPHENHYDRAMINE HCL 25 MG PO CAPS
ORAL_CAPSULE | ORAL | Status: AC
  Filled 2016-02-28: qty 1

## 2016-02-28 MED ORDER — ACETAMINOPHEN 325 MG PO TABS
ORAL_TABLET | ORAL | Status: AC
  Filled 2016-02-28: qty 2

## 2016-02-28 MED ORDER — HEPARIN SOD (PORK) LOCK FLUSH 100 UNIT/ML IV SOLN
500.0000 [IU] | Freq: Once | INTRAVENOUS | Status: AC | PRN
  Administered 2016-02-28: 500 [IU]
  Filled 2016-02-28: qty 5

## 2016-02-28 MED ORDER — DIPHENHYDRAMINE HCL 25 MG PO CAPS
25.0000 mg | ORAL_CAPSULE | Freq: Once | ORAL | Status: AC
  Administered 2016-02-28: 25 mg via ORAL

## 2016-02-28 MED ORDER — SODIUM CHLORIDE 0.9% FLUSH
10.0000 mL | INTRAVENOUS | Status: DC | PRN
  Administered 2016-02-28: 10 mL
  Filled 2016-02-28: qty 10

## 2016-02-28 MED ORDER — ACETAMINOPHEN 325 MG PO TABS
650.0000 mg | ORAL_TABLET | Freq: Once | ORAL | Status: AC
  Administered 2016-02-28: 650 mg via ORAL

## 2016-02-28 MED ORDER — TRASTUZUMAB CHEMO 150 MG IV SOLR
6.0000 mg/kg | Freq: Once | INTRAVENOUS | Status: AC
  Administered 2016-02-28: 567 mg via INTRAVENOUS
  Filled 2016-02-28: qty 27

## 2016-02-28 NOTE — Patient Instructions (Signed)
[facility] Discharge Instructions for Patients Receiving Chemotherapy  Today you received the following chemotherapy agents:  Herceptin  To help prevent nausea and vomiting after your treatment, we encourage you to take your nausea medication as prescribed.   If you develop nausea and vomiting that is not controlled by your nausea medication, call the clinic.   BELOW ARE SYMPTOMS THAT SHOULD BE REPORTED IMMEDIATELY:  *FEVER GREATER THAN 100.5 F  *CHILLS WITH OR WITHOUT FEVER  NAUSEA AND VOMITING THAT IS NOT CONTROLLED WITH YOUR NAUSEA MEDICATION  *UNUSUAL SHORTNESS OF BREATH  *UNUSUAL BRUISING OR BLEEDING  TENDERNESS IN MOUTH AND THROAT WITH OR WITHOUT PRESENCE OF ULCERS  *URINARY PROBLEMS  *BOWEL PROBLEMS  UNUSUAL RASH Items with * indicate a potential emergency and should be followed up as soon as possible.  Feel free to call the clinic you have any questions or concerns. The clinic phone number is [phone].  Please show the CHEMO ALERT CARD at check-in to the Emergency Department and triage nurse.   

## 2016-03-01 ENCOUNTER — Ambulatory Visit
Admission: RE | Admit: 2016-03-01 | Discharge: 2016-03-01 | Disposition: A | Discharge status: Home / Self Care | Payer: Medicaid Other | Source: Ambulatory Visit | Attending: Radiation Oncology | Admitting: Radiation Oncology

## 2016-03-01 ENCOUNTER — Encounter: Payer: Self-pay | Admitting: Radiation Oncology

## 2016-03-01 DIAGNOSIS — C50511 Malignant neoplasm of lower-outer quadrant of right female breast: Secondary | ICD-10-CM | POA: Diagnosis present

## 2016-03-01 HISTORY — DX: Personal history of irradiation: Z92.3

## 2016-03-01 NOTE — Progress Notes (Signed)
[facility] End of Treatment Note  Name:Phyllis Wiggins  Date: 02/01/2016 [id number] DOB:1971-01-28    DIAGNOSIS:  Right breast cancer   INDICATION FOR TREATMENT: Curative   TREATMENT DATES: 12/18/15 - 02/01/16                         SITE/DOSE:    1) Right Breast: 45 Gy in 25 fractions 2) Right Breast Boost: 16 Gy in 8 fractions                      BEAMS/ENERGY:  1)  3D / 10X and 6X   ; 2)  Electron / 15 MeV     NARRATIVE: Patient tolerated RT well.  At the end of the course she started applying neosporin to moist peeling at inframammary fold on right                         PLAN: Routine followup in one month. Patient instructed to call if questions or worsening complaints in interim. -----------------------------------  Eppie Gibson, MD

## 2016-03-01 NOTE — Progress Notes (Signed)
Electron Holiday representative Note  Diagnosis: Breast Cancer  1. Breast cancer of lower-outer quadrant of right female breast (HCC) 174.5 C50.511 hyaluronate sodium (RADIAPLEXRX) gel    The patient's CT images from her initial simulation were reviewed to plan her boost treatment to her right breast  lumpectomy cavity.  Measurements were made regarding the size and depth of the surgical bed. The boost to the lumpectomy cavity will be delivered with 15 MeV electrons; 16 Gy in 8 fractions has been prescribed to the 100% isodose line.   An electron Best boy was reviewed and approved.  A custom electron cut-out will be used for her boost field.    -----------------------------------  Eppie Gibson, MD

## 2016-03-01 NOTE — Progress Notes (Signed)
Radiation Oncology         [phone] ________________________________  Name: Phyllis Wiggins MRN: [id number]  Date: 03/01/2016  DOB: 1970-08-06  Follow-Up Visit Note  Outpatient  CC: Mora Bellman, MD  Magrinat, Virgie Dad, MD  Diagnosis and Prior Radiotherapy:    ICD-9-CM ICD-10-CM   1. Breast cancer of lower-outer quadrant of right female breast ([place]) 174.5 C50.511     ypTX, ypN0, ypMX invasive ductal carcinoma of the left breast  12/18/15 - 02/01/16: Right Breast: 45 Gy in 25 fractions Right Breast Boost: 16 Gy in 8 fractions  Narrative:  The patient returns today for routine follow-up. She denies pain at this time, but reports that if she stretches and uses her arm to press on something she has some pain in the arm when she moves it. She continues to receive Herceptin through December. The skin to her Right Breast has healed and she is using a Coca Butter cream that has vitamin E and A. She has questions regarding the [employer] program, and her ability to get in a pool.   ALLERGIES:  is allergic to cheese; dairy aid [lactase]; whey; and wine [alcohol].  Meds: Current Outpatient Prescriptions  Medication Sig Dispense Refill  . acetaminophen (TYLENOL) 650 MG CR tablet Take 650 mg by mouth every 8 (eight) hours as needed for pain.    . Biotin 10 MG CAPS Take by mouth. Reported on 01/31/2016    . ibuprofen (ADVIL,MOTRIN) 200 MG tablet Take 200 mg by mouth every 6 (six) hours as needed for mild pain. Reported on 01/31/2016    . lidocaine-prilocaine (EMLA) cream Apply to affected area once 30 g 3  . meclizine (ANTIVERT) 25 MG tablet Take 25 mg by mouth as needed for dizziness. Reported on 01/31/2016    . gabapentin (NEURONTIN) 300 MG capsule Take 1 capsule (300 mg total) by mouth at bedtime. (Patient not taking: Reported on 03/01/2016) 90 capsule 4   No current facility-administered medications for this encounter.     Physical Findings: The patient is in no acute distress. Patient  is alert and oriented.  height is 5\' 7"  (1.702 m) and weight is 228 lb 14.4 oz (103.8 kg). Her temperature is 98 F (36.7 C). Her blood pressure is 132/80 and her pulse is 81. .    The right breast skin is intact with no dryness and some residual erythema.  Lab Findings: Lab Results  Component Value Date   WBC 5.8 02/28/2016   HGB 12.9 02/28/2016   HCT 38.0 02/28/2016   MCV 87.6 02/28/2016   PLT 233 02/28/2016    Radiographic Findings: No results found.  Impression/Plan: She is healing well from radiation therapy. The skin has healed well. It is fine for her to swim in a pool since her skin has healed well. I support her enrollment in the [facility].  I encouraged her to continue with yearly mammography and followup with medical oncology. I will see her back on an as-needed basis. I have encouraged her to call if she has any issues or concerns in the future. I wished her the very best.   _____________________________________   Eppie Gibson, MD  This document serves as a record of services personally performed by Eppie Gibson, MD. It was created on her behalf by Darcus Austin, a trained medical scribe. The creation of this record is based on the scribe's personal observations and the provider's statements to them. This document has been checked and approved by the attending provider.

## 2016-03-01 NOTE — Progress Notes (Signed)
Phyllis Wiggins is here for follow up of radiation completed 02/01/16 to her Right Breast. She denies pain at this time, but reports that if she stretches and uses her arm to press on something she has some pain in the arm when she moves it. She continues to receive herceptin through December. The skin to her Right Breast has healed and she is using a Coca Butter cream that has vitamin E and A. She has questions regarding the [employer] program, and her ability to get in a pool.   BP 132/80   Pulse 81   Temp 98 F (36.7 C)   Ht 5\' 7"  (1.702 m)   Wt 228 lb 14.4 oz (103.8 kg)   BMI 35.85 kg/m    Wt Readings from Last 3 Encounters:  03/01/16 228 lb 14.4 oz (103.8 kg)  02/07/16 223 lb 11.2 oz (101.5 kg)  01/31/16 225 lb (102.1 kg)

## 2016-03-14 ENCOUNTER — Ambulatory Visit: Payer: Self-pay | Admitting: Radiation Oncology

## 2016-03-20 ENCOUNTER — Ambulatory Visit (HOSPITAL_BASED_OUTPATIENT_CLINIC_OR_DEPARTMENT_OTHER): Payer: Medicaid Other

## 2016-03-20 ENCOUNTER — Other Ambulatory Visit (HOSPITAL_BASED_OUTPATIENT_CLINIC_OR_DEPARTMENT_OTHER): Payer: Medicaid Other

## 2016-03-20 VITALS — BP 120/57 | HR 78 | Temp 98.4°F | Resp 16

## 2016-03-20 DIAGNOSIS — C50511 Malignant neoplasm of lower-outer quadrant of right female breast: Secondary | ICD-10-CM

## 2016-03-20 DIAGNOSIS — Z5112 Encounter for antineoplastic immunotherapy: Secondary | ICD-10-CM

## 2016-03-20 LAB — COMPREHENSIVE METABOLIC PANEL
ALBUMIN: 3.4 g/dL — AB (ref 3.5–5.0)
ALK PHOS: 81 U/L (ref 40–150)
ALT: 21 U/L (ref 0–55)
AST: 16 U/L (ref 5–34)
Anion Gap: 9 mEq/L (ref 3–11)
BUN: 13.5 mg/dL (ref 7.0–26.0)
CALCIUM: 9.7 mg/dL (ref 8.4–10.4)
CHLORIDE: 106 meq/L (ref 98–109)
CO2: 25 mEq/L (ref 22–29)
Creatinine: 0.9 mg/dL (ref 0.6–1.1)
EGFR: 80 mL/min/{1.73_m2} — AB (ref >90)
GLUCOSE: 147 mg/dL — AB (ref 70–140)
POTASSIUM: 4 meq/L (ref 3.5–5.1)
SODIUM: 140 meq/L (ref 136–145)
Total Bilirubin: 0.33 mg/dL (ref 0.20–1.20)
Total Protein: 7 g/dL (ref 6.4–8.3)

## 2016-03-20 LAB — CBC WITH DIFFERENTIAL/PLATELET
BASO%: 0.3 % (ref 0.0–2.0)
BASOS ABS: 0 10*3/uL (ref 0.0–0.1)
EOS ABS: 0.1 10*3/uL (ref 0.0–0.5)
EOS%: 2.4 % (ref 0.0–7.0)
HCT: 38.3 % (ref 34.8–46.6)
HEMOGLOBIN: 13.1 g/dL (ref 11.6–15.9)
LYMPH%: 27.3 % (ref 14.0–49.7)
MCH: 29.9 pg (ref 25.1–34.0)
MCHC: 34.2 g/dL (ref 31.5–36.0)
MCV: 87.4 fL (ref 79.5–101.0)
MONO#: 0.3 10*3/uL (ref 0.1–0.9)
MONO%: 5.6 % (ref 0.0–14.0)
NEUT#: 3.8 10*3/uL (ref 1.5–6.5)
NEUT%: 64.4 % (ref 38.4–76.8)
Platelets: 234 10*3/uL (ref 145–400)
RBC: 4.38 10*6/uL (ref 3.70–5.45)
RDW: 12.1 % (ref 11.2–14.5)
WBC: 5.9 10*3/uL (ref 3.9–10.3)
lymph#: 1.6 10*3/uL (ref 0.9–3.3)

## 2016-03-20 MED ORDER — ACETAMINOPHEN 325 MG PO TABS
ORAL_TABLET | ORAL | Status: AC
  Filled 2016-03-20: qty 2

## 2016-03-20 MED ORDER — DIPHENHYDRAMINE HCL 25 MG PO CAPS
25.0000 mg | ORAL_CAPSULE | Freq: Once | ORAL | Status: AC
  Administered 2016-03-20: 25 mg via ORAL

## 2016-03-20 MED ORDER — SODIUM CHLORIDE 0.9% FLUSH
10.0000 mL | INTRAVENOUS | Status: DC | PRN
  Administered 2016-03-20: 10 mL
  Filled 2016-03-20: qty 10

## 2016-03-20 MED ORDER — TRASTUZUMAB CHEMO 150 MG IV SOLR
6.0000 mg/kg | Freq: Once | INTRAVENOUS | Status: AC
  Administered 2016-03-20: 567 mg via INTRAVENOUS
  Filled 2016-03-20: qty 27

## 2016-03-20 MED ORDER — SODIUM CHLORIDE 0.9 % IV SOLN
Freq: Once | INTRAVENOUS | Status: AC
  Administered 2016-03-20: 09:00:00 via INTRAVENOUS

## 2016-03-20 MED ORDER — DIPHENHYDRAMINE HCL 25 MG PO CAPS
ORAL_CAPSULE | ORAL | Status: AC
  Filled 2016-03-20: qty 1

## 2016-03-20 MED ORDER — ACETAMINOPHEN 325 MG PO TABS
650.0000 mg | ORAL_TABLET | Freq: Once | ORAL | Status: AC
  Administered 2016-03-20: 650 mg via ORAL

## 2016-03-20 MED ORDER — HEPARIN SOD (PORK) LOCK FLUSH 100 UNIT/ML IV SOLN
500.0000 [IU] | Freq: Once | INTRAVENOUS | Status: AC | PRN
  Administered 2016-03-20: 500 [IU]
  Filled 2016-03-20: qty 5

## 2016-03-20 NOTE — Patient Instructions (Signed)
[facility] Discharge Instructions for Patients Receiving Chemotherapy  Today you received the following chemotherapy agents: Herceptin   To help prevent nausea and vomiting after your treatment, we encourage you to take your nausea medication as directed.    If you develop nausea and vomiting that is not controlled by your nausea medication, call the clinic.   BELOW ARE SYMPTOMS THAT SHOULD BE REPORTED IMMEDIATELY:  *FEVER GREATER THAN 100.5 F  *CHILLS WITH OR WITHOUT FEVER  NAUSEA AND VOMITING THAT IS NOT CONTROLLED WITH YOUR NAUSEA MEDICATION  *UNUSUAL SHORTNESS OF BREATH  *UNUSUAL BRUISING OR BLEEDING  TENDERNESS IN MOUTH AND THROAT WITH OR WITHOUT PRESENCE OF ULCERS  *URINARY PROBLEMS  *BOWEL PROBLEMS  UNUSUAL RASH Items with * indicate a potential emergency and should be followed up as soon as possible.  Feel free to call the clinic you have any questions or concerns. The clinic phone number is [phone].  Please show the CHEMO ALERT CARD at check-in to the Emergency Department and triage nurse.   

## 2016-03-21 ENCOUNTER — Ambulatory Visit (HOSPITAL_BASED_OUTPATIENT_CLINIC_OR_DEPARTMENT_OTHER)
Admission: RE | Admit: 2016-03-21 | Discharge: 2016-03-21 | Disposition: A | Discharge status: Home / Self Care | Payer: Medicaid Other | Source: Ambulatory Visit | Attending: Internal Medicine | Admitting: Internal Medicine

## 2016-03-21 ENCOUNTER — Ambulatory Visit (HOSPITAL_COMMUNITY)
Admission: RE | Admit: 2016-03-21 | Discharge: 2016-03-21 | Disposition: A | Discharge status: Home / Self Care | Payer: Medicaid Other | Source: Ambulatory Visit | Attending: Obstetrics and Gynecology | Admitting: Obstetrics and Gynecology

## 2016-03-21 VITALS — BP 108/78 | HR 77 | Wt 230.5 lb

## 2016-03-21 DIAGNOSIS — C50911 Malignant neoplasm of unspecified site of right female breast: Secondary | ICD-10-CM | POA: Insufficient documentation

## 2016-03-21 DIAGNOSIS — C50511 Malignant neoplasm of lower-outer quadrant of right female breast: Secondary | ICD-10-CM | POA: Diagnosis not present

## 2016-03-21 DIAGNOSIS — Z09 Encounter for follow-up examination after completed treatment for conditions other than malignant neoplasm: Secondary | ICD-10-CM | POA: Diagnosis present

## 2016-03-21 NOTE — Addendum Note (Signed)
Encounter addended by: Scarlette Calico, RN on: 03/21/2016 11:50 AM<BR>    Actions taken: Sign clinical note

## 2016-03-21 NOTE — Progress Notes (Signed)
  Echocardiogram 2D Echocardiogram has been performed.  Phyllis Wiggins 03/21/2016, 11:21 AM

## 2016-03-21 NOTE — Progress Notes (Signed)
CARDIO-ONCOLOGY CLINIC CONSULT NOTE  Referring Physician: Magrinat   HPI:  Phyllis Wiggins is a 45 y.o. [place] woman with breast cancer who is referred to cardio-oncology clinic by Dr. Jana Hakim.  She is status post left breast biopsy 06/15/2015 for a clinical T1 cN0, stage IA invasive ductal carcinoma, grade 3, estrogen and progesterone receptor negative, HER-2 amplified, with an MIB-1 of 40%  (1) neoadjuvant chemotherapy to consist of carboplatin, docetaxel, pertuzumab, and trastuzumab with onpro support every 3 weeks  5 started 07/11/2015, last dose T/P 10/25/2015  (a) carboplatin and docetaxel stopped after 5 cycles due to patient intolerance  (2) Status post right lumpectomy and sentinel lymph node sampling for 01/23/2016 showing a complete pathologic response in the breast and 3 sentinel lymph nodes sampled, ypT0 ypN0  (3) adjuvant radiation to follow   (4) trastuzumab started 11/14/2015, to be continued to mid December 2017  (a) echocardiogram 10/13/2015 shows an ejection fraction of 55%  (5) genetics testing 08/16/2015 through the Breast/Ovarian gene panel offered by [facility] i found no deleterious mutations  No h/o known cardiac disease. Tolerating Herceptin without problem. No SOB or swelling. Feels ok.    Echo 12/16: EF 55-60% RV normal  Lat s' 10.6 GLS -19.8 Echo 6/17: EF 50-55% RV normal Lat s' 10.95 GLS - 21.4 (no 4 chamber) Echo 12/16: EF 55-60% RV normal  Lat s' 12.0 GLS -18.9%   Past Medical History:  Diagnosis Date  . Arthritis   . Breast cancer ([place])   . Breast cancer of lower-outer quadrant of right female breast ([place]) 06/20/2015  . Family history of stomach cancer   . Hx of radiation therapy 12/18/15- 02/01/16   Right Breast    Current Outpatient Prescriptions  Medication Sig Dispense Refill  . acetaminophen (TYLENOL) 650 MG CR tablet Take 650 mg by mouth every 8 (eight) hours as needed for pain.    . Biotin 10000 MCG TABS Take 20,000 mcg by mouth.    .  lidocaine-prilocaine (EMLA) cream Apply to affected area once 30 g 3  . meclizine (ANTIVERT) 25 MG tablet Take 25 mg by mouth as needed for dizziness. Reported on 01/31/2016     No current facility-administered medications for this encounter.     Allergies  Allergen Reactions  . Cheese   . Dairy Aid [Lactase]     "sinus infections"  . Whey     Sinus issues  . Wine [Alcohol]     Severe sinus issues due to fermentation      Social History   Social History  . Marital status: Divorced    Spouse name: N/A  . Number of children: 0  . Years of education: N/A   Occupational History  . Not on file.   Social History Main Topics  . Smoking status: Former Smoker    Types: Cigarettes    Quit date: 07/04/2010  . Smokeless tobacco: Never Used  . Alcohol use No  . Drug use: No  . Sexual activity: Not Currently    Birth control/ protection: None   Other Topics Concern  . Not on file   Social History Narrative  . No narrative on file      Family History  Problem Relation Age of Onset  . Diabetes Mother   . Hypertension Mother   . Hyperlipidemia Mother   . Stomach cancer Maternal Grandfather 49  . Diabetes Paternal Grandmother   . Dementia Maternal Uncle   . Congestive Heart Failure Maternal Grandmother  Vitals:   03/21/16 1113  BP: 108/78  Pulse: 77  SpO2: 99%  Weight: 230 lb 8 oz (104.6 kg)    PHYSICAL EXAM: General:  Well appearing. No respiratory difficulty HEENT: normal Neck: supple. no JVD. Carotids 2+ bilat; no bruits. No lymphadenopathy or thryomegaly appreciated. Cor: PMI nondisplaced. Regular rate & rhythm. No rubs, gallops or murmurs. Lungs: clear Abdomen: obese soft, nontender, nondistended. No hepatosplenomegaly. No bruits or masses. Good bowel sounds. Extremities: no cyanosis, clubbing, rash, trace edema Neuro: alert & oriented x 3, cranial nerves grossly intact. moves all 4 extremities w/o difficulty. Affect pleasant.     ASSESSMENT &  PLAN:  1. Breast CA (ER/PR -/HER 2-neu +)  I reviewed echos personally. EF and Doppler parameters stable. No HF on exam. Continue Herceptin. Will finish in December 2018.    Phyllis Marlin,MD 11:43 AM

## 2016-03-21 NOTE — Patient Instructions (Signed)
We will contact you in 3 months to schedule your next appointment.  

## 2016-03-25 ENCOUNTER — Encounter: Payer: Self-pay | Admitting: Oncology

## 2016-03-25 NOTE — Progress Notes (Signed)
Left form from orthopedics dr for dr. Jana Hakim for sleeve.

## 2016-04-10 ENCOUNTER — Ambulatory Visit (HOSPITAL_BASED_OUTPATIENT_CLINIC_OR_DEPARTMENT_OTHER): Payer: Medicaid Other

## 2016-04-10 ENCOUNTER — Ambulatory Visit (HOSPITAL_BASED_OUTPATIENT_CLINIC_OR_DEPARTMENT_OTHER): Payer: Medicaid Other | Admitting: Oncology

## 2016-04-10 ENCOUNTER — Ambulatory Visit: Payer: Medicaid Other

## 2016-04-10 ENCOUNTER — Other Ambulatory Visit (HOSPITAL_BASED_OUTPATIENT_CLINIC_OR_DEPARTMENT_OTHER): Payer: Medicaid Other

## 2016-04-10 VITALS — BP 125/79 | HR 78 | Temp 98.6°F | Resp 18 | Ht 67.0 in | Wt 232.0 lb

## 2016-04-10 DIAGNOSIS — Z5112 Encounter for antineoplastic immunotherapy: Secondary | ICD-10-CM | POA: Diagnosis present

## 2016-04-10 DIAGNOSIS — C50511 Malignant neoplasm of lower-outer quadrant of right female breast: Secondary | ICD-10-CM

## 2016-04-10 DIAGNOSIS — Z171 Estrogen receptor negative status [ER-]: Secondary | ICD-10-CM | POA: Diagnosis not present

## 2016-04-10 LAB — CBC WITH DIFFERENTIAL/PLATELET
BASO%: 0.4 % (ref 0.0–2.0)
BASOS ABS: 0 10*3/uL (ref 0.0–0.1)
EOS%: 2 % (ref 0.0–7.0)
Eosinophils Absolute: 0.1 10*3/uL (ref 0.0–0.5)
HCT: 39.8 % (ref 34.8–46.6)
HGB: 13.3 g/dL (ref 11.6–15.9)
LYMPH%: 25.8 % (ref 14.0–49.7)
MCH: 29.5 pg (ref 25.1–34.0)
MCHC: 33.3 g/dL (ref 31.5–36.0)
MCV: 88.4 fL (ref 79.5–101.0)
MONO#: 0.3 10*3/uL (ref 0.1–0.9)
MONO%: 4.2 % (ref 0.0–14.0)
NEUT#: 5 10*3/uL (ref 1.5–6.5)
NEUT%: 67.6 % (ref 38.4–76.8)
Platelets: 244 10*3/uL (ref 145–400)
RBC: 4.5 10*6/uL (ref 3.70–5.45)
RDW: 12.4 % (ref 11.2–14.5)
WBC: 7.4 10*3/uL (ref 3.9–10.3)
lymph#: 1.9 10*3/uL (ref 0.9–3.3)

## 2016-04-10 LAB — COMPREHENSIVE METABOLIC PANEL
ALT: 18 U/L (ref 0–55)
AST: 15 U/L (ref 5–34)
Albumin: 3.5 g/dL (ref 3.5–5.0)
Alkaline Phosphatase: 83 U/L (ref 40–150)
Anion Gap: 10 mEq/L (ref 3–11)
BUN: 19.1 mg/dL (ref 7.0–26.0)
CHLORIDE: 107 meq/L (ref 98–109)
CO2: 22 mEq/L (ref 22–29)
Calcium: 9.4 mg/dL (ref 8.4–10.4)
Creatinine: 0.9 mg/dL (ref 0.6–1.1)
EGFR: 78 mL/min/{1.73_m2} — ABNORMAL LOW (ref >90)
GLUCOSE: 196 mg/dL — AB (ref 70–140)
POTASSIUM: 4.2 meq/L (ref 3.5–5.1)
SODIUM: 139 meq/L (ref 136–145)
Total Bilirubin: <0.3 mg/dL (ref 0.20–1.20)
Total Protein: 7.1 g/dL (ref 6.4–8.3)

## 2016-04-10 MED ORDER — DIPHENHYDRAMINE HCL 25 MG PO CAPS
ORAL_CAPSULE | ORAL | Status: AC
  Filled 2016-04-10: qty 1

## 2016-04-10 MED ORDER — DIPHENHYDRAMINE HCL 25 MG PO CAPS
25.0000 mg | ORAL_CAPSULE | Freq: Once | ORAL | Status: AC
  Administered 2016-04-10: 25 mg via ORAL

## 2016-04-10 MED ORDER — SODIUM CHLORIDE 0.9% FLUSH
10.0000 mL | INTRAVENOUS | Status: DC | PRN
  Filled 2016-04-10: qty 10

## 2016-04-10 MED ORDER — ACETAMINOPHEN 325 MG PO TABS
650.0000 mg | ORAL_TABLET | Freq: Once | ORAL | Status: AC
  Administered 2016-04-10: 650 mg via ORAL

## 2016-04-10 MED ORDER — SODIUM CHLORIDE 0.9 % IV SOLN
Freq: Once | INTRAVENOUS | Status: AC
  Administered 2016-04-10: 10:00:00 via INTRAVENOUS

## 2016-04-10 MED ORDER — ACETAMINOPHEN 325 MG PO TABS
ORAL_TABLET | ORAL | Status: AC
  Filled 2016-04-10: qty 2

## 2016-04-10 MED ORDER — TRASTUZUMAB CHEMO INJECTION 440 MG
6.0000 mg/kg | Freq: Once | INTRAVENOUS | Status: AC
  Administered 2016-04-10: 567 mg via INTRAVENOUS
  Filled 2016-04-10: qty 27

## 2016-04-10 MED ORDER — HEPARIN SOD (PORK) LOCK FLUSH 100 UNIT/ML IV SOLN
500.0000 [IU] | Freq: Once | INTRAVENOUS | Status: DC | PRN
  Filled 2016-04-10: qty 5

## 2016-04-10 NOTE — Progress Notes (Signed)
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 She brought it to medical attention when she presented for the free Pap smear screening clinic here 06/01/2015. Exam on that day showed a lump in the right breast at the 4:00 position under the areola. There was also a scar on the right outer breast. This scar was noted in 01/04/2008 when she had her baseline mammogram at [facility]. It had been stable for a year at that time and the patient had treated it with a "blood root paste" which caused scarring. 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 It is in a separate location from the new mass.  On 06/05/2015 Phyllis Wiggins underwent bilateral diagnostic mammography at [facility], with bilateral ultrasonography. This found the breast density to be category B. There was a 2.5 cm oval mass in the right breast at the 5:00 position which by ultrasound measured 2.1 cm. In the left breast there was an area of focal asymmetry which by ultrasound was consistent with fibroglandular tissue/benign.  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  Her subsequent history is as detailed below.  INTERVAL HISTORY: Phyllis Wiggins returns today for follow-up of her HER-2/neu positive breast cancer accompanied by her mother. She receives trastuzumab every 3 weeks, which we are continuing through December. She just had a repeat transthoracic echo on 03/21/2016, which shows a well-preserved ejection fraction in the 55 5 and 60% range. Wall motion was normal. She tolerates these treatments with no side effects that she is aware of  REVIEW OF SYSTEMS:  Phyllis Wiggins's hair has come in darker than usual and curly. Her nails are not yet normal. She is developing pain in the right breast which is mostly in the right axilla and lateral aspect of the breast when she overdoes it at work but otherwise can be in the inferior aspect of the breast also. However it feels like she has 100s of splinters in the breast. Fortunately there is discomfort is very brief when it occurs. Hot flashes are better. She hasn't had any vaginal dryness problems. Her nails are still recovering, with a lot of peeling and there are 2 soft recess. She has not had a period since January 2017. A detailed review of systems today was otherwise stable  PAST MEDICAL HISTORY: Past Medical History:  Diagnosis Date  . Arthritis   . Breast cancer ([place])   . Breast cancer of lower-outer quadrant of right female breast ([place]) 06/20/2015  . Family history of stomach cancer   . Hx of radiation therapy 12/18/15- 02/01/16   Right Breast  PAST SURGICAL HISTORY: Past Surgical History:  Procedure Laterality Date  . BREAST LUMPECTOMY WITH RADIOACTIVE SEED AND SENTINEL LYMPH NODE BIOPSY Right 11/09/2015   Procedure: RIGHT BREAST LUMPECTOMY WITH RADIOACTIVE SEED AND SENTINEL LYMPH NODE BIOPSY AND EXCISION OF RIGHT BREAST FIBROADENOMA;  Surgeon: Chevis Pretty III, MD;  Location: [facility];  Service: General;  Laterality: Right;  . PORTACATH PLACEMENT Left 07/06/2015   Procedure: INSERTION PORT-A-CATH;  Surgeon: Chevis Pretty III, MD;  Location: [facility] OR;  Service: General;  Laterality: Left;  . WISDOM TOOTH EXTRACTION      FAMILY HISTORY Family History  Problem Relation Age of Onset  . Diabetes Mother   . Hypertension Mother   . Hyperlipidemia Mother   . Stomach cancer Maternal Grandfather 45  . Diabetes Paternal Grandmother   . Dementia Maternal Uncle   . Congestive Heart Failure Maternal Grandmother    the patient's parents are both living, in their early 53s. The patient had no brother, one sister. The patient's mother works by Education officer, environmental homes for private clients. The patient's sister is a Runner, broadcasting/film/video in [place] there is no history of breast or ovarian cancer in the family. The patient's paternal grandfather died at the age of 30 from stomach cancer felt to be related to iron unusual toxic exposure   GYNECOLOGIC HISTORY:  No LMP recorded. Patient is not currently having periods (Reason: Chemotherapy).  menarche age 9, she is GX P0  she is still having periods but they're quite sporadic. They may occur 6 weeks apart or 3 weeks apart, may be heavy or only spotting. 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 She is divorced and at home lives with 6 dogs.     ADVANCED DIRECTIVES:  not in place    HEALTH MAINTENANCE: Social History  Substance Use Topics  . Smoking status: Former Smoker    Types: Cigarettes    Quit date: 07/04/2010  . Smokeless tobacco: Never Used  . Alcohol use No     Colonoscopy:  PAP: October 2016   Bone density:  Lipid panel:  Allergies  Allergen Reactions  . Cheese   . Dairy  Aid [Lactase]     "sinus infections"  . Whey     Sinus issues  . 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 acetaminophen (TYLENOL) 650 MG CR tablet Take 650 mg by mouth every 8 (eight) hours as needed for pain.    . Biotin 01027 MCG TABS Take 20,000 mcg by mouth.    . lidocaine-prilocaine (EMLA) cream Apply to affected area once 30 g 3  . meclizine (ANTIVERT) 25 MG tablet Take 25 mg by mouth as needed for dizziness. Reported on 01/31/2016     No current facility-administered medications for this visit.     OBJECTIVE: Middle-aged white woman In no acute distress Vitals:   04/10/16 0945  BP: 125/79  Pulse: 78  Resp: 18  Temp: 98.6 F (37 C)     Body mass index is 36.34 kg/m.    ECOG FS:1 - Symptomatic but completely ambulatory were   Sclerae unicteric, pupils round and equal Oropharynx clear and moist-- no thrush or other lesions No cervical or supraclavicular adenopathy  Lungs no rales or rhonchi Heart regular rate and rhythm Abd soft, nontender, positive bowel sounds MSK no focal spinal tenderness, no upper extremity lymphedema Neuro: nonfocal, well oriented, appropriate affect Breasts: The right breast is status post lumpectomy and radiation. There is the expected skin thickening but no significant hyperpigmentation, erythema, or tenderness to palpation. There is no evidence of local recurrence. The right axilla is benign. Left breast is unremarkable     LAB RESULTS:  CMP     Component Value Date/Time   NA 140 03/20/2016 0826   K 4.0 03/20/2016 0826   CO2 25 03/20/2016 0826   GLUCOSE 147 (H) 03/20/2016 0826   BUN 13.5 03/20/2016 0826   CREATININE 0.9 03/20/2016 0826   CALCIUM 9.7 03/20/2016 0826   PROT 7.0 03/20/2016 0826   ALBUMIN 3.4 (L) 03/20/2016 0826   AST 16 03/20/2016 0826   ALT 21 03/20/2016 0826   ALKPHOS 81 03/20/2016 0826   BILITOT 0.33 03/20/2016 0826    INo  results found for: SPEP, UPEP  Lab Results  Component Value Date   WBC 7.4 04/10/2016   NEUTROABS 5.0 04/10/2016   HGB 13.3 04/10/2016   HCT 39.8 04/10/2016   MCV 88.4 04/10/2016   PLT 244 04/10/2016      Chemistry      Component Value Date/Time   NA 140 03/20/2016 0826   K 4.0 03/20/2016 0826   CO2 25 03/20/2016 0826   BUN 13.5 03/20/2016 0826   CREATININE 0.9 03/20/2016 0826      Component Value Date/Time   CALCIUM 9.7 03/20/2016 0826   ALKPHOS 81 03/20/2016 0826   AST 16 03/20/2016 0826   ALT 21 03/20/2016 0826   BILITOT 0.33 03/20/2016 0826       No results found for: LABCA2  No components found for: LABCA125  No results for input(s): INR in the last 168 hours.  Urinalysis No results found for: COLORURINE, APPEARANCEUR, LABSPEC, PHURINE, GLUCOSEU, HGBUR, BILIRUBINUR, KETONESUR, PROTEINUR, UROBILINOGEN, NITRITE, LEUKOCYTESUR  STUDIES: Transthoracic Echocardiography  Patient:    Phyllis Wiggins, Phyllis Wiggins MR #:       [id number] Study Date: 03/21/2016 Gender:     F Age:        87 Height:     170.2 cm Weight:     103.8 kg BSA:        2.26 m^2 Pt. Status: Room:   ATTENDING    Mora Bellman [id number]  [id number]    [id number], Peggy [id number]  ORDERING     Bensimhon, Daniel  REFERRING    Bensimhon, Sterling Big, Outpatient  SONOGRAPHER  Roseanna Rainbow  cc:  ------------------------------------------------------------------- LV EF: 55% -   60%    ASSESSMENT: 45 y.o. BRCA negative [place] woman status post left breast lower outer quadrant biopsy 06/15/2015 for a clinical T1 cN0, stage IA invasive ductal carcinoma, grade 3, estrogen and progesterone receptor negative, HER-2 amplified, with an MIB-1 of 40%  (1) neoadjuvant chemotherapy consisting of carboplatin, docetaxel, pertuzumab, and trastuzumab with onpro support every 3 weeks 6 started 07/11/2015, last dose T/P 10/25/2015  (a) carboplatin and docetaxel stopped after 5 cycles due to patient  intolerance  (2) Status post right lumpectomy and sentinel lymph node sampling for 01/23/2016 showing a complete pathologic response in the breast and 3 sentinel lymph nodes sampled, ypT0 ypN0  (3) adjuvant radiation completed 01/31/2016  (4) trastuzumab started 11/14/2015, to be continued to mid December 2017  (a) echocardiogram 03/21/2016 shows an ejection fraction of 55-60%  (  5) genetics testing 08/16/2015 through the Breast/Ovarian gene panel offered by [facility] i found no deleterious mutations in ATM, BARD1, BRCA1, BRCA2, BRIP1, CDH1, CHEK2, EPCAM, FANCC, MLH1, MSH2, MSH6, NBN, PALB2, PMS2, PTEN, RAD51C, RAD51D, TP53, and XRCC2.   PLAN: Phyllis Wiggins is tolerating her trastuzumab treatment without any unusual side effects and she is proceeding with treatment today.  Her most recent echocardiogram at the middle of last month was very reassuring.  Her fingernails will take another few months to completely normalized. The same goes for her energy level. Nevertheless she is doing very well on proceeding just as she is supposed to at this point.  The feeling of discomfort or pain which is likely C in her right breast is not uncommon and it is benign. If it bothers her enough she can start gabapentin, but this is likely to make her sleepy during the day. The discomfort is very brief period at this point she prefers not to do anything to intervene with it.  Her periods have not recurred since January 2017 and she understands this does not mean that she has postmenopausal. If she is sexually active she still needs to use contraceptives.  She is going to run out of Medicaid in another month. She is interested in being guided as to how to apply for an extension. I will get one of our navigators to help her with this.  Otherwise she is going to see me 07/24/2016 which is the last day of trastuzumab. She knows to call for any problems that may develop before that visit.  Chauncey Cruel, MD   04/10/2016  9:54 AM

## 2016-04-10 NOTE — Patient Instructions (Signed)
[facility] Discharge Instructions for Patients Receiving Chemotherapy  Today you received the following chemotherapy agents: Herceptin   To help prevent nausea and vomiting after your treatment, we encourage you to take your nausea medication as directed.    If you develop nausea and vomiting that is not controlled by your nausea medication, call the clinic.   BELOW ARE SYMPTOMS THAT SHOULD BE REPORTED IMMEDIATELY:  *FEVER GREATER THAN 100.5 F  *CHILLS WITH OR WITHOUT FEVER  NAUSEA AND VOMITING THAT IS NOT CONTROLLED WITH YOUR NAUSEA MEDICATION  *UNUSUAL SHORTNESS OF BREATH  *UNUSUAL BRUISING OR BLEEDING  TENDERNESS IN MOUTH AND THROAT WITH OR WITHOUT PRESENCE OF ULCERS  *URINARY PROBLEMS  *BOWEL PROBLEMS  UNUSUAL RASH Items with * indicate a potential emergency and should be followed up as soon as possible.  Feel free to call the clinic you have any questions or concerns. The clinic phone number is [phone].  Please show the CHEMO ALERT CARD at check-in to the Emergency Department and triage nurse.   

## 2016-04-11 ENCOUNTER — Telehealth: Payer: Self-pay | Admitting: *Deleted

## 2016-04-11 NOTE — Telephone Encounter (Signed)
Pt called about her appt for 9/20 stating that it was scheduled in error.  Looked at Dr. Virgie Dad note and the patient needs to be seen on 12/20.  Pt has her last treatment scheduled for this day and the times for her treatment and when Dr. Jana Hakim can see her do not work with her scheduled.  I transferred her to scheduling to leave a message for someone to call her back and coordinate the appts for her for 07/24/16.

## 2016-04-22 ENCOUNTER — Telehealth: Payer: Self-pay | Admitting: Oncology

## 2016-04-22 NOTE — Telephone Encounter (Signed)
Spoke with pt to confirm appt change to 07/24/16 per LOS

## 2016-04-24 ENCOUNTER — Ambulatory Visit: Payer: Medicaid Other | Admitting: Oncology

## 2016-04-24 ENCOUNTER — Other Ambulatory Visit: Payer: Medicaid Other

## 2016-05-01 ENCOUNTER — Other Ambulatory Visit (HOSPITAL_BASED_OUTPATIENT_CLINIC_OR_DEPARTMENT_OTHER): Payer: Medicaid Other

## 2016-05-01 ENCOUNTER — Ambulatory Visit: Payer: Medicaid Other

## 2016-05-01 ENCOUNTER — Ambulatory Visit (HOSPITAL_BASED_OUTPATIENT_CLINIC_OR_DEPARTMENT_OTHER): Payer: Medicaid Other

## 2016-05-01 VITALS — BP 118/85 | HR 83 | Temp 98.7°F | Resp 18

## 2016-05-01 DIAGNOSIS — Z5112 Encounter for antineoplastic immunotherapy: Secondary | ICD-10-CM | POA: Diagnosis not present

## 2016-05-01 DIAGNOSIS — C50511 Malignant neoplasm of lower-outer quadrant of right female breast: Secondary | ICD-10-CM

## 2016-05-01 LAB — CBC WITH DIFFERENTIAL/PLATELET
BASO%: 0.5 % (ref 0.0–2.0)
Basophils Absolute: 0 10*3/uL (ref 0.0–0.1)
EOS ABS: 0.1 10*3/uL (ref 0.0–0.5)
EOS%: 2 % (ref 0.0–7.0)
HCT: 40.4 % (ref 34.8–46.6)
HEMOGLOBIN: 13.5 g/dL (ref 11.6–15.9)
LYMPH%: 32.1 % (ref 14.0–49.7)
MCH: 29.4 pg (ref 25.1–34.0)
MCHC: 33.3 g/dL (ref 31.5–36.0)
MCV: 88.1 fL (ref 79.5–101.0)
MONO#: 0.3 10*3/uL (ref 0.1–0.9)
MONO%: 5.3 % (ref 0.0–14.0)
NEUT%: 60.1 % (ref 38.4–76.8)
NEUTROS ABS: 4 10*3/uL (ref 1.5–6.5)
PLATELETS: 250 10*3/uL (ref 145–400)
RBC: 4.59 10*6/uL (ref 3.70–5.45)
RDW: 12.7 % (ref 11.2–14.5)
WBC: 6.6 10*3/uL (ref 3.9–10.3)
lymph#: 2.1 10*3/uL (ref 0.9–3.3)

## 2016-05-01 LAB — COMPREHENSIVE METABOLIC PANEL
ALBUMIN: 3.4 g/dL — AB (ref 3.5–5.0)
ALK PHOS: 95 U/L (ref 40–150)
ALT: 23 U/L (ref 0–55)
ANION GAP: 12 meq/L — AB (ref 3–11)
AST: 20 U/L (ref 5–34)
BILIRUBIN TOTAL: 0.34 mg/dL (ref 0.20–1.20)
BUN: 13 mg/dL (ref 7.0–26.0)
CO2: 22 mEq/L (ref 22–29)
CREATININE: 0.8 mg/dL (ref 0.6–1.1)
Calcium: 10.2 mg/dL (ref 8.4–10.4)
Chloride: 107 mEq/L (ref 98–109)
EGFR: 83 mL/min/{1.73_m2} — AB (ref >90)
GLUCOSE: 108 mg/dL (ref 70–140)
Potassium: 4.3 mEq/L (ref 3.5–5.1)
SODIUM: 141 meq/L (ref 136–145)
TOTAL PROTEIN: 7.2 g/dL (ref 6.4–8.3)

## 2016-05-01 MED ORDER — HEPARIN SOD (PORK) LOCK FLUSH 100 UNIT/ML IV SOLN
500.0000 [IU] | Freq: Once | INTRAVENOUS | Status: AC | PRN
  Administered 2016-05-01: 500 [IU]
  Filled 2016-05-01: qty 5

## 2016-05-01 MED ORDER — TRASTUZUMAB CHEMO 150 MG IV SOLR
6.0000 mg/kg | Freq: Once | INTRAVENOUS | Status: AC
  Administered 2016-05-01: 567 mg via INTRAVENOUS
  Filled 2016-05-01: qty 27

## 2016-05-01 MED ORDER — ACETAMINOPHEN 325 MG PO TABS
ORAL_TABLET | ORAL | Status: AC
  Filled 2016-05-01: qty 2

## 2016-05-01 MED ORDER — DIPHENHYDRAMINE HCL 25 MG PO CAPS
ORAL_CAPSULE | ORAL | Status: AC
  Filled 2016-05-01: qty 1

## 2016-05-01 MED ORDER — SODIUM CHLORIDE 0.9 % IV SOLN
Freq: Once | INTRAVENOUS | Status: AC
  Administered 2016-05-01: 09:00:00 via INTRAVENOUS

## 2016-05-01 MED ORDER — ACETAMINOPHEN 325 MG PO TABS
650.0000 mg | ORAL_TABLET | Freq: Once | ORAL | Status: AC
  Administered 2016-05-01: 650 mg via ORAL

## 2016-05-01 MED ORDER — SODIUM CHLORIDE 0.9% FLUSH
10.0000 mL | INTRAVENOUS | Status: DC | PRN
  Administered 2016-05-01: 10 mL
  Filled 2016-05-01: qty 10

## 2016-05-01 MED ORDER — DIPHENHYDRAMINE HCL 25 MG PO CAPS
25.0000 mg | ORAL_CAPSULE | Freq: Once | ORAL | Status: AC
Start: 2016-05-01 — End: 2016-05-01
  Administered 2016-05-01: 25 mg via ORAL

## 2016-05-01 NOTE — Patient Instructions (Signed)
[facility] Discharge Instructions for Patients Receiving Chemotherapy  Today you received the following chemotherapy agents: Herceptin   To help prevent nausea and vomiting after your treatment, we encourage you to take your nausea medication as directed.    If you develop nausea and vomiting that is not controlled by your nausea medication, call the clinic.   BELOW ARE SYMPTOMS THAT SHOULD BE REPORTED IMMEDIATELY:  *FEVER GREATER THAN 100.5 F  *CHILLS WITH OR WITHOUT FEVER  NAUSEA AND VOMITING THAT IS NOT CONTROLLED WITH YOUR NAUSEA MEDICATION  *UNUSUAL SHORTNESS OF BREATH  *UNUSUAL BRUISING OR BLEEDING  TENDERNESS IN MOUTH AND THROAT WITH OR WITHOUT PRESENCE OF ULCERS  *URINARY PROBLEMS  *BOWEL PROBLEMS  UNUSUAL RASH Items with * indicate a potential emergency and should be followed up as soon as possible.  Feel free to call the clinic you have any questions or concerns. The clinic phone number is [phone].  Please show the CHEMO ALERT CARD at check-in to the Emergency Department and triage nurse.   

## 2016-05-08 ENCOUNTER — Telehealth: Payer: Self-pay | Admitting: *Deleted

## 2016-05-08 ENCOUNTER — Telehealth (HOSPITAL_COMMUNITY): Payer: Self-pay | Admitting: *Deleted

## 2016-05-08 NOTE — Telephone Encounter (Signed)
Telephoned patient at home number. Advised patient that [facility] Medicaid did expire then end of October but since was still in active treatment would be eligible for [facility] Medicaid. Advised patient would completed paperwork and send to DSS to today. Patient voiced understanding. Recertification paperwork completed and sent to DSS.

## 2016-05-08 NOTE — Telephone Encounter (Signed)
  Oncology Nurse Navigator Documentation  Navigator Location: [facility] (05/08/16 1600) Navigator Encounter Type: Telephone (05/08/16 1600) Telephone: Outgoing Call;Financial Assistance (05/08/16 1600)  pt in question of her [facility] medicaid. Msg sent to [place]. Left vm for pt informing that I have reached out to the [facility] program for assistance.                                      Time Spent with Patient: 15 (05/08/16 1600)

## 2016-05-22 ENCOUNTER — Ambulatory Visit (HOSPITAL_BASED_OUTPATIENT_CLINIC_OR_DEPARTMENT_OTHER): Payer: Medicaid Other

## 2016-05-22 ENCOUNTER — Telehealth: Payer: Self-pay | Admitting: *Deleted

## 2016-05-22 ENCOUNTER — Ambulatory Visit: Payer: Medicaid Other

## 2016-05-22 ENCOUNTER — Other Ambulatory Visit (HOSPITAL_BASED_OUTPATIENT_CLINIC_OR_DEPARTMENT_OTHER): Payer: Medicaid Other

## 2016-05-22 VITALS — BP 119/63 | HR 78 | Temp 98.7°F | Resp 16

## 2016-05-22 DIAGNOSIS — Z5112 Encounter for antineoplastic immunotherapy: Secondary | ICD-10-CM

## 2016-05-22 DIAGNOSIS — C50511 Malignant neoplasm of lower-outer quadrant of right female breast: Secondary | ICD-10-CM

## 2016-05-22 LAB — CBC WITH DIFFERENTIAL/PLATELET
BASO%: 0.3 % (ref 0.0–2.0)
BASOS ABS: 0 10*3/uL (ref 0.0–0.1)
EOS ABS: 0.1 10*3/uL (ref 0.0–0.5)
EOS%: 1.8 % (ref 0.0–7.0)
HEMATOCRIT: 39.1 % (ref 34.8–46.6)
HEMOGLOBIN: 13.3 g/dL (ref 11.6–15.9)
LYMPH#: 2.2 10*3/uL (ref 0.9–3.3)
LYMPH%: 27.3 % (ref 14.0–49.7)
MCH: 29.6 pg (ref 25.1–34.0)
MCHC: 34 g/dL (ref 31.5–36.0)
MCV: 87.1 fL (ref 79.5–101.0)
MONO#: 0.4 10*3/uL (ref 0.1–0.9)
MONO%: 4.8 % (ref 0.0–14.0)
NEUT#: 5.2 10*3/uL (ref 1.5–6.5)
NEUT%: 65.8 % (ref 38.4–76.8)
PLATELETS: 239 10*3/uL (ref 145–400)
RBC: 4.49 10*6/uL (ref 3.70–5.45)
RDW: 12 % (ref 11.2–14.5)
WBC: 7.9 10*3/uL (ref 3.9–10.3)

## 2016-05-22 LAB — COMPREHENSIVE METABOLIC PANEL
ALBUMIN: 3.6 g/dL (ref 3.5–5.0)
ALK PHOS: 91 U/L (ref 40–150)
ALT: 18 U/L (ref 0–55)
ANION GAP: 10 meq/L (ref 3–11)
AST: 16 U/L (ref 5–34)
BUN: 9.8 mg/dL (ref 7.0–26.0)
CALCIUM: 9.7 mg/dL (ref 8.4–10.4)
CHLORIDE: 106 meq/L (ref 98–109)
CO2: 24 mEq/L (ref 22–29)
Creatinine: 0.9 mg/dL (ref 0.6–1.1)
EGFR: 82 mL/min/{1.73_m2} — AB (ref >90)
Glucose: 136 mg/dl (ref 70–140)
POTASSIUM: 4.1 meq/L (ref 3.5–5.1)
Sodium: 140 mEq/L (ref 136–145)
Total Bilirubin: 0.45 mg/dL (ref 0.20–1.20)
Total Protein: 7.3 g/dL (ref 6.4–8.3)

## 2016-05-22 MED ORDER — TRASTUZUMAB CHEMO 150 MG IV SOLR
6.0000 mg/kg | Freq: Once | INTRAVENOUS | Status: AC
  Administered 2016-05-22: 630 mg via INTRAVENOUS
  Filled 2016-05-22: qty 30

## 2016-05-22 MED ORDER — HEPARIN SOD (PORK) LOCK FLUSH 100 UNIT/ML IV SOLN
500.0000 [IU] | Freq: Once | INTRAVENOUS | Status: AC | PRN
  Administered 2016-05-22: 500 [IU]
  Filled 2016-05-22: qty 5

## 2016-05-22 MED ORDER — ACETAMINOPHEN 325 MG PO TABS
ORAL_TABLET | ORAL | Status: AC
  Filled 2016-05-22: qty 2

## 2016-05-22 MED ORDER — SODIUM CHLORIDE 0.9 % IV SOLN
Freq: Once | INTRAVENOUS | Status: AC
  Administered 2016-05-22: 10:00:00 via INTRAVENOUS

## 2016-05-22 MED ORDER — DIPHENHYDRAMINE HCL 25 MG PO CAPS
ORAL_CAPSULE | ORAL | Status: AC
  Filled 2016-05-22: qty 1

## 2016-05-22 MED ORDER — ACETAMINOPHEN 325 MG PO TABS
650.0000 mg | ORAL_TABLET | Freq: Once | ORAL | Status: AC
  Administered 2016-05-22: 650 mg via ORAL

## 2016-05-22 MED ORDER — DIPHENHYDRAMINE HCL 25 MG PO CAPS
25.0000 mg | ORAL_CAPSULE | Freq: Once | ORAL | Status: AC
  Administered 2016-05-22: 25 mg via ORAL

## 2016-05-22 MED ORDER — SODIUM CHLORIDE 0.9% FLUSH
10.0000 mL | INTRAVENOUS | Status: DC | PRN
  Administered 2016-05-22: 10 mL
  Filled 2016-05-22: qty 10

## 2016-05-22 NOTE — Telephone Encounter (Signed)
Call from [place] at [employer].  Signed by Dr. Jana Hakim for pt to get some Compression Garments.  She says she has been faxing the forms but has not gotten any reply.   I gave her direct Fax number to [employer].

## 2016-05-22 NOTE — Patient Instructions (Signed)
[facility] Discharge Instructions for Patients Receiving Chemotherapy  Today you received the following chemotherapy agents: Herceptin   To help prevent nausea and vomiting after your treatment, we encourage you to take your nausea medication as directed.    If you develop nausea and vomiting that is not controlled by your nausea medication, call the clinic.   BELOW ARE SYMPTOMS THAT SHOULD BE REPORTED IMMEDIATELY:  *FEVER GREATER THAN 100.5 F  *CHILLS WITH OR WITHOUT FEVER  NAUSEA AND VOMITING THAT IS NOT CONTROLLED WITH YOUR NAUSEA MEDICATION  *UNUSUAL SHORTNESS OF BREATH  *UNUSUAL BRUISING OR BLEEDING  TENDERNESS IN MOUTH AND THROAT WITH OR WITHOUT PRESENCE OF ULCERS  *URINARY PROBLEMS  *BOWEL PROBLEMS  UNUSUAL RASH Items with * indicate a potential emergency and should be followed up as soon as possible.  Feel free to call the clinic you have any questions or concerns. The clinic phone number is [phone].  Please show the CHEMO ALERT CARD at check-in to the Emergency Department and triage nurse.   

## 2016-06-12 ENCOUNTER — Other Ambulatory Visit (HOSPITAL_BASED_OUTPATIENT_CLINIC_OR_DEPARTMENT_OTHER): Payer: Medicaid Other

## 2016-06-12 ENCOUNTER — Ambulatory Visit (HOSPITAL_BASED_OUTPATIENT_CLINIC_OR_DEPARTMENT_OTHER): Payer: Medicaid Other

## 2016-06-12 VITALS — BP 123/77 | HR 71 | Temp 97.7°F | Resp 18

## 2016-06-12 DIAGNOSIS — Z5112 Encounter for antineoplastic immunotherapy: Secondary | ICD-10-CM | POA: Diagnosis present

## 2016-06-12 DIAGNOSIS — C50511 Malignant neoplasm of lower-outer quadrant of right female breast: Secondary | ICD-10-CM

## 2016-06-12 LAB — CBC WITH DIFFERENTIAL/PLATELET
BASO%: 0.6 % (ref 0.0–2.0)
Basophils Absolute: 0 10*3/uL (ref 0.0–0.1)
EOS ABS: 0.1 10*3/uL (ref 0.0–0.5)
EOS%: 1.9 % (ref 0.0–7.0)
HCT: 41.5 % (ref 34.8–46.6)
HGB: 13.9 g/dL (ref 11.6–15.9)
LYMPH%: 28.6 % (ref 14.0–49.7)
MCH: 29.6 pg (ref 25.1–34.0)
MCHC: 33.6 g/dL (ref 31.5–36.0)
MCV: 88.1 fL (ref 79.5–101.0)
MONO#: 0.4 10*3/uL (ref 0.1–0.9)
MONO%: 5.2 % (ref 0.0–14.0)
NEUT%: 63.7 % (ref 38.4–76.8)
NEUTROS ABS: 4.5 10*3/uL (ref 1.5–6.5)
PLATELETS: 279 10*3/uL (ref 145–400)
RBC: 4.71 10*6/uL (ref 3.70–5.45)
RDW: 12.7 % (ref 11.2–14.5)
WBC: 7 10*3/uL (ref 3.9–10.3)
lymph#: 2 10*3/uL (ref 0.9–3.3)

## 2016-06-12 LAB — COMPREHENSIVE METABOLIC PANEL
ALBUMIN: 3.5 g/dL (ref 3.5–5.0)
ALK PHOS: 94 U/L (ref 40–150)
ALT: 22 U/L (ref 0–55)
AST: 16 U/L (ref 5–34)
Anion Gap: 10 mEq/L (ref 3–11)
BILIRUBIN TOTAL: 0.43 mg/dL (ref 0.20–1.20)
BUN: 14.2 mg/dL (ref 7.0–26.0)
CO2: 24 mEq/L (ref 22–29)
CREATININE: 0.9 mg/dL (ref 0.6–1.1)
Calcium: 10.1 mg/dL (ref 8.4–10.4)
Chloride: 105 mEq/L (ref 98–109)
EGFR: 76 mL/min/{1.73_m2} — AB (ref >90)
GLUCOSE: 117 mg/dL (ref 70–140)
Potassium: 4.4 mEq/L (ref 3.5–5.1)
SODIUM: 139 meq/L (ref 136–145)
TOTAL PROTEIN: 7.4 g/dL (ref 6.4–8.3)

## 2016-06-12 MED ORDER — SODIUM CHLORIDE 0.9 % IV SOLN
Freq: Once | INTRAVENOUS | Status: AC
  Administered 2016-06-12: 10:00:00 via INTRAVENOUS

## 2016-06-12 MED ORDER — ACETAMINOPHEN 325 MG PO TABS
650.0000 mg | ORAL_TABLET | Freq: Once | ORAL | Status: AC
  Administered 2016-06-12: 650 mg via ORAL

## 2016-06-12 MED ORDER — DIPHENHYDRAMINE HCL 25 MG PO CAPS
ORAL_CAPSULE | ORAL | Status: AC
  Filled 2016-06-12: qty 1

## 2016-06-12 MED ORDER — ACETAMINOPHEN 325 MG PO TABS
ORAL_TABLET | ORAL | Status: AC
Start: 2016-06-12 — End: 2016-06-12
  Filled 2016-06-12: qty 2

## 2016-06-12 MED ORDER — SODIUM CHLORIDE 0.9% FLUSH
10.0000 mL | INTRAVENOUS | Status: DC | PRN
  Administered 2016-06-12: 10 mL
  Filled 2016-06-12: qty 10

## 2016-06-12 MED ORDER — TRASTUZUMAB CHEMO 150 MG IV SOLR
6.0000 mg/kg | Freq: Once | INTRAVENOUS | Status: AC
  Administered 2016-06-12: 630 mg via INTRAVENOUS
  Filled 2016-06-12: qty 30

## 2016-06-12 MED ORDER — HEPARIN SOD (PORK) LOCK FLUSH 100 UNIT/ML IV SOLN
500.0000 [IU] | Freq: Once | INTRAVENOUS | Status: AC | PRN
  Administered 2016-06-12: 500 [IU]
  Filled 2016-06-12: qty 5

## 2016-06-12 MED ORDER — DIPHENHYDRAMINE HCL 25 MG PO CAPS
25.0000 mg | ORAL_CAPSULE | Freq: Once | ORAL | Status: AC
  Administered 2016-06-12: 25 mg via ORAL

## 2016-06-12 NOTE — Patient Instructions (Signed)
[facility] Discharge Instructions for Patients Receiving Chemotherapy  Today you received the following chemotherapy agents: Herceptin   To help prevent nausea and vomiting after your treatment, we encourage you to take your nausea medication as directed.    If you develop nausea and vomiting that is not controlled by your nausea medication, call the clinic.   BELOW ARE SYMPTOMS THAT SHOULD BE REPORTED IMMEDIATELY:  *FEVER GREATER THAN 100.5 F  *CHILLS WITH OR WITHOUT FEVER  NAUSEA AND VOMITING THAT IS NOT CONTROLLED WITH YOUR NAUSEA MEDICATION  *UNUSUAL SHORTNESS OF BREATH  *UNUSUAL BRUISING OR BLEEDING  TENDERNESS IN MOUTH AND THROAT WITH OR WITHOUT PRESENCE OF ULCERS  *URINARY PROBLEMS  *BOWEL PROBLEMS  UNUSUAL RASH Items with * indicate a potential emergency and should be followed up as soon as possible.  Feel free to call the clinic you have any questions or concerns. The clinic phone number is [phone].  Please show the CHEMO ALERT CARD at check-in to the Emergency Department and triage nurse.   

## 2016-06-19 ENCOUNTER — Ambulatory Visit (HOSPITAL_BASED_OUTPATIENT_CLINIC_OR_DEPARTMENT_OTHER)
Admission: RE | Admit: 2016-06-19 | Discharge: 2016-06-19 | Disposition: A | Discharge status: Home / Self Care | Payer: Medicaid Other | Source: Ambulatory Visit | Attending: Internal Medicine | Admitting: Internal Medicine

## 2016-06-19 ENCOUNTER — Ambulatory Visit (HOSPITAL_COMMUNITY)
Admission: RE | Admit: 2016-06-19 | Discharge: 2016-06-19 | Disposition: A | Discharge status: Home / Self Care | Payer: Medicaid Other | Source: Ambulatory Visit | Attending: Neurology | Admitting: Neurology

## 2016-06-19 VITALS — BP 130/90 | HR 80 | Wt 242.0 lb

## 2016-06-19 DIAGNOSIS — I427 Cardiomyopathy due to drug and external agent: Secondary | ICD-10-CM | POA: Diagnosis not present

## 2016-06-19 DIAGNOSIS — C50511 Malignant neoplasm of lower-outer quadrant of right female breast: Secondary | ICD-10-CM | POA: Insufficient documentation

## 2016-06-19 DIAGNOSIS — T451X5A Adverse effect of antineoplastic and immunosuppressive drugs, initial encounter: Secondary | ICD-10-CM

## 2016-06-19 MED ORDER — CARVEDILOL 3.125 MG PO TABS: 3.1250 mg | ORAL_TABLET | Freq: Two times a day (BID) | ORAL | 3 refills | Status: DC

## 2016-06-19 MED FILL — CARVEDILOL 3.125 MG TABLET: 3.125 | 30 days supply | Qty: 60 | Fill #0

## 2016-06-19 NOTE — Addendum Note (Signed)
Encounter addended by: Kennieth Rad, RN on: 06/19/2016  4:14 PM<BR>    Actions taken: Order list changed, Diagnosis association updated, Sign clinical note

## 2016-06-19 NOTE — Progress Notes (Signed)
  Echocardiogram 2D Echocardiogram has been performed.  Diamond Nickel 06/19/2016, 2:57 PM

## 2016-06-19 NOTE — Progress Notes (Signed)
CARDIO-ONCOLOGY CLINIC CONSULT NOTE  Referring Physician: Magrin[place]   HPI:  Phyllis Wiggins is a 45 y.o. [place] woman with breast cancer who is referred to cardio-oncology clinic by Dr. Jana Hakim.  She is status post left breast biopsy 06/15/2015 for a clinical T1 cN0, stage IA invasive ductal carcinoma, grade 3, estrogen and progesterone receptor negative, HER-2 amplified, with an MIB-1 of 40%  (1) neoadjuvant chemotherapy to consist of carboplatin, docetaxel, pertuzumab, and trastuzumab with onpro support every 3 weeks  5 started 07/11/2015, last dose T/P 10/25/2015  (a) carboplatin and docetaxel stopped after 5 cycles due to patient intolerance  (2) Status post right lumpectomy and sentinel lymph node sampling for 01/23/2016 showing a complete pathologic response in the breast and 3 sentinel lymph nodes sampled, ypT0 ypN0  (3) adjuvant radiation to follow   (4) trastuzumab started 11/14/2015, to be continued to mid December 2017  (5) genetics testing 08/16/2015 through the Breast/Ovarian gene panel offered by [facility] i found no deleterious mutations  Feels fine. Has been a bit tired. But no HF symptoms. Working FT as a Environmental manager.    Echo 12/16: EF 55-60% RV normal  Lat s' 10.6 GLS -19.8 Echo 6/17: EF 50-55% RV normal Lat s' 10.95 GLS - 21.4 (no 4 chamber) Echo 8/17: EF 55-60% RV normal  Lat s' 12.0 GLS -18.9% Echo 11/17: EF 45-50% RV normal. Lat s' 9.7 GLS -14.4%    Past Medical History:  Diagnosis Date  . Arthritis   . Breast cancer ([place])   . Breast cancer of lower-outer quadrant of right female breast ([place]) 06/20/2015  . Family history of stomach cancer   . Hx of radiation therapy 12/18/15- 02/01/16   Right Breast    Current Outpatient Prescriptions  Medication Sig Dispense Refill  . acetaminophen (TYLENOL) 650 MG CR tablet Take 650 mg by mouth every 8 (eight) hours as needed for pain.    . Biotin 10000 MCG TABS Take 20,000 mcg by mouth.    .  lidocaine-prilocaine (EMLA) cream Apply to affected area once 30 g 3  . meclizine (ANTIVERT) 25 MG tablet Take 25 mg by mouth as needed for dizziness. Reported on 01/31/2016     No current facility-administered medications for this encounter.     Allergies  Allergen Reactions  . Cheese   . Dairy Aid [Lactase]     "sinus infections"  . Whey     Sinus issues  . Wine [Alcohol]     Severe sinus issues due to fermentation      Social History   Social History  . Marital status: Divorced    Spouse name: N/A  . Number of children: 0  . Years of education: N/A   Occupational History  . Not on file.   Social History Main Topics  . Smoking status: Former Smoker    Types: Cigarettes    Quit date: 07/04/2010  . Smokeless tobacco: Never Used  . Alcohol use No  . Drug use: No  . Sexual activity: Not Currently    Birth control/ protection: None   Other Topics Concern  . Not on file   Social History Narrative  . No narrative on file      Family History  Problem Relation Age of Onset  . Diabetes Mother   . Hypertension Mother   . Hyperlipidemia Mother   . Stomach cancer Maternal Grandfather 61  . Diabetes Paternal Grandmother   . Dementia Maternal Uncle   . Congestive Heart Failure Maternal Grandmother  Vitals:   06/19/16 1514  BP: 130/90  Pulse: 80  SpO2: 97%  Weight: 242 lb (109.8 kg)    PHYSICAL EXAM: General:  Well appearing. No respiratory difficulty HEENT: normal Neck: supple. no JVD. Carotids 2+ bilat; no bruits. No lymphadenopathy or thryomegaly appreciated. Cor: PMI nondisplaced. Regular rate & rhythm. No rubs, gallops or murmurs. Lungs: clear Abdomen: obese soft, nontender, nondistended. No hepatosplenomegaly. No bruits or masses. Good bowel sounds. Extremities: no cyanosis, clubbing, rash, no edema Neuro: alert & oriented x 3, cranial nerves grossly intact. moves all 4 extremities w/o difficulty. Affect pleasant.     ASSESSMENT & PLAN:  1.  Breast CA (ER/PR -/HER 2-neu +)  I reviewed echos personally. EF and Doppler parameters are down slightly. She has 2 more Herceptin treatments to go. I will discuss with Dr. Lonia Chimera whether we should just stop Herceptin or give her a one moth break. Repeat echo and then try to finish therapy. Start carvedilol 3.125 bid. Repeat echo 1 month.    2. Chemo-induced CM  As above  3. HTN - borderline elevated today. We are starting carvedilol. Will reassess at next visit, if persistently elevated can start anti-HTN agent.  Mickle Campton,MD 4:09 PM     \

## 2016-06-19 NOTE — Addendum Note (Signed)
Encounter addended by: Kennieth Rad, RN on: 06/19/2016  4:15 PM<BR>    Actions taken: Sign clinical note

## 2016-06-19 NOTE — Patient Instructions (Addendum)
Start taking Carvedilol 3.125 (1Tab) Twice Daily with meal  Echo with Follow up in 1 month

## 2016-07-02 ENCOUNTER — Other Ambulatory Visit: Payer: Self-pay | Admitting: Oncology

## 2016-07-03 ENCOUNTER — Ambulatory Visit: Payer: Medicaid Other

## 2016-07-03 ENCOUNTER — Other Ambulatory Visit: Payer: Medicaid Other

## 2016-07-18 ENCOUNTER — Other Ambulatory Visit: Payer: Self-pay | Admitting: Oncology

## 2016-07-18 ENCOUNTER — Ambulatory Visit (HOSPITAL_BASED_OUTPATIENT_CLINIC_OR_DEPARTMENT_OTHER)
Admission: RE | Admit: 2016-07-18 | Discharge: 2016-07-18 | Disposition: A | Discharge status: Home / Self Care | Payer: Medicaid Other | Source: Ambulatory Visit | Attending: Internal Medicine | Admitting: Internal Medicine

## 2016-07-18 ENCOUNTER — Ambulatory Visit (HOSPITAL_COMMUNITY)
Admission: RE | Admit: 2016-07-18 | Discharge: 2016-07-18 | Disposition: A | Discharge status: Home / Self Care | Payer: Medicaid Other | Source: Ambulatory Visit | Attending: Family Medicine | Admitting: Family Medicine

## 2016-07-18 VITALS — BP 120/78 | HR 72 | Wt 241.5 lb

## 2016-07-18 DIAGNOSIS — C50511 Malignant neoplasm of lower-outer quadrant of right female breast: Secondary | ICD-10-CM | POA: Diagnosis not present

## 2016-07-18 DIAGNOSIS — T451X5A Adverse effect of antineoplastic and immunosuppressive drugs, initial encounter: Secondary | ICD-10-CM | POA: Diagnosis not present

## 2016-07-18 DIAGNOSIS — I427 Cardiomyopathy due to drug and external agent: Secondary | ICD-10-CM | POA: Diagnosis not present

## 2016-07-18 DIAGNOSIS — I071 Rheumatic tricuspid insufficiency: Secondary | ICD-10-CM | POA: Diagnosis not present

## 2016-07-18 MED ORDER — LOSARTAN POTASSIUM 25 MG PO TABS: 25.0000 mg | ORAL_TABLET | Freq: Every day | ORAL | 3 refills | Status: DC

## 2016-07-18 MED FILL — LOSARTAN POTASSIUM 25 MG TA: 25 | 30 days supply | Qty: 30 | Fill #0

## 2016-07-18 NOTE — Progress Notes (Signed)
CARDIO-ONCOLOGY CLINIC CONSULT NOTE  Referring Physician: Magrinat   HPI:  Phyllis Wiggins is a 45 y.o. [place] woman with breast cancer who is referred to cardio-oncology clinic by Dr. Jana Hakim.  She is status post left breast biopsy 06/15/2015 for a clinical T1 cN0, stage IA invasive ductal carcinoma, grade 3, estrogen and progesterone receptor negative, HER-2 amplified, with an MIB-1 of 40%  (1) neoadjuvant chemotherapy to consist of carboplatin, docetaxel, pertuzumab, and trastuzumab with onpro support every 3 weeks  5 started 07/11/2015, last dose T/P 10/25/2015  (a) carboplatin and docetaxel stopped after 5 cycles due to patient intolerance  (2) Status post right lumpectomy and sentinel lymph node sampling for 01/23/2016 showing a complete pathologic response in the breast and 3 sentinel lymph nodes sampled, ypT0 ypN0  (3) adjuvant radiation to follow   (4) trastuzumab started 11/14/2015, to be continued to mid December 2017  (5) genetics testing 08/16/2015 through the Breast/Ovarian gene panel offered by [facility] i found no deleterious mutations  At last visit EF was felt to be a little down Started carvedilol and last Herceptin was stopped (only had 2 doses left). Feels fine. Fatigued at times. No HF symptoms. Working FT as a Environmental manager.    Echo 12/16: EF 55-60% RV normal  Lat s' 10.6 GLS -19.8 Echo 6/17: EF 50-55% RV normal Lat s' 10.95 GLS - 21.4 (no 4 chamber) Echo 8/17: EF 55-60% RV normal  Lat s' 12.0 GLS -18.9% Echo 11/17: EF 45-50% RV normal. Lat s' 9.7 GLS -14.4% Echo 07/18/16 Ef 45-50% LS' 11.7 GLS -20.2%   Past Medical History:  Diagnosis Date  . Arthritis   . Breast cancer ([place])   . Breast cancer of lower-outer quadrant of right female breast ([place]) 06/20/2015  . Family history of stomach cancer   . Hx of radiation therapy 12/18/15- 02/01/16   Right Breast    Current Outpatient Prescriptions  Medication Sig Dispense Refill  . acetaminophen  (TYLENOL) 650 MG CR tablet Take 650 mg by mouth every 8 (eight) hours as needed for pain.    . Biotin 10000 MCG TABS Take 20,000 mcg by mouth.    . carvedilol (COREG) 3.125 MG tablet Take 1 tablet (3.125 mg total) by mouth 2 (two) times daily with a meal. 60 tablet 3  . lidocaine-prilocaine (EMLA) cream Apply to affected area once 30 g 3  . meclizine (ANTIVERT) 25 MG tablet Take 25 mg by mouth as needed for dizziness. Reported on 01/31/2016     No current facility-administered medications for this encounter.     Allergies  Allergen Reactions  . Cheese   . Dairy Aid [Lactase]     "sinus infections"  . Whey     Sinus issues  . Wine [Alcohol]     Severe sinus issues due to fermentation      Social History   Social History  . Marital status: Divorced    Spouse name: N/A  . Number of children: 0  . Years of education: N/A   Occupational History  . Not on file.   Social History Main Topics  . Smoking status: Former Smoker    Types: Cigarettes    Quit date: 07/04/2010  . Smokeless tobacco: Never Used  . Alcohol use No  . Drug use: No  . Sexual activity: Not Currently    Birth control/ protection: None   Other Topics Concern  . Not on file   Social History Narrative  . No narrative on file  Family History  Problem Relation Age of Onset  . Diabetes Mother   . Hypertension Mother   . Hyperlipidemia Mother   . Stomach cancer Maternal Grandfather 110  . Diabetes Paternal Grandmother   . Dementia Maternal Uncle   . Congestive Heart Failure Maternal Grandmother     Vitals:   07/18/16 1535  BP: 120/78  Pulse: 72  SpO2: 97%  Weight: 241 lb 8 oz (109.5 kg)    PHYSICAL EXAM: General:  Well appearing. No respiratory difficulty HEENT: normal Neck: supple. no JVD. Carotids 2+ bilat; no bruits. No lymphadenopathy or thryomegaly appreciated. Cor: PMI nondisplaced. Regular rate & rhythm. No rubs, gallops or murmurs. Lungs: clear Abdomen: obese soft, nontender,  nondistended. No hepatosplenomegaly. No bruits or masses. Good bowel sounds. Extremities: no cyanosis, clubbing, rash, no edema Neuro: alert & oriented x 3, cranial nerves grossly intact. moves all 4 extremities w/o difficulty. Affect pleasant.     ASSESSMENT & PLAN:  1. Breast CA (ER/PR -/HER 2-neu +)  I reviewed echos personally and discussed with Dr. Ena Dawley as well. EF still low normal. But lateral s' and GLS have normalized. I spoke to Dr. Jana Hakim today and plan will be just to stop Herceptin (only had 2 treatments left). Will continue carvedilol and add losartan 25 daily. Repeat echo 2 months and then can likely stop meds If EF stable/ improved.   2. Chemo-induced CM As above  3. HTN - BP looks good. Adding losartan for possible cardiotoxicity.  Wyman Meschke,MD 3:39 PM

## 2016-07-18 NOTE — Progress Notes (Signed)
I was called by Dr. Ronna Polio regarding [facility] EF, which is that the little and she also had a strain pattern. Although things are little bit better on repeat his suggestion is that we discontinueHerceptin at this point. I am comfortable with that. She will see me 1220 and we will initiate long-term follow-up at that time

## 2016-07-18 NOTE — Progress Notes (Signed)
Echocardiogram 2D Echocardiogram has been performed.  Phyllis Wiggins 07/18/2016, 3:04 PM

## 2016-07-18 NOTE — Patient Instructions (Addendum)
START Losartan 25 mg tablet once daily at bedtime.  Follow up with Dr. Haroldine Laws with echocardiogram in 2 months.  Do the following things EVERYDAY: 1) Weigh yourself in the morning before breakfast. Write it down and keep it in a log. 2) Take your medicines as prescribed 3) Eat low salt foods-Limit salt (sodium) to 2000 mg per day.  4) Stay as active as you can everyday 5) Limit all fluids for the day to less than 2 liters

## 2016-07-24 ENCOUNTER — Other Ambulatory Visit: Payer: Medicaid Other

## 2016-07-24 ENCOUNTER — Other Ambulatory Visit (HOSPITAL_BASED_OUTPATIENT_CLINIC_OR_DEPARTMENT_OTHER): Payer: Medicaid Other

## 2016-07-24 ENCOUNTER — Ambulatory Visit (HOSPITAL_BASED_OUTPATIENT_CLINIC_OR_DEPARTMENT_OTHER): Payer: Medicaid Other | Admitting: Oncology

## 2016-07-24 ENCOUNTER — Ambulatory Visit: Payer: Medicaid Other

## 2016-07-24 VITALS — BP 111/67 | HR 89 | Temp 98.6°F | Resp 17 | Ht 67.0 in | Wt 243.2 lb

## 2016-07-24 DIAGNOSIS — Z171 Estrogen receptor negative status [ER-]: Secondary | ICD-10-CM | POA: Diagnosis not present

## 2016-07-24 DIAGNOSIS — C50511 Malignant neoplasm of lower-outer quadrant of right female breast: Secondary | ICD-10-CM

## 2016-07-24 LAB — COMPREHENSIVE METABOLIC PANEL
ALK PHOS: 84 U/L (ref 40–150)
ALT: 19 U/L (ref 0–55)
ANION GAP: 8 meq/L (ref 3–11)
AST: 16 U/L (ref 5–34)
Albumin: 3.6 g/dL (ref 3.5–5.0)
BUN: 19.6 mg/dL (ref 7.0–26.0)
CHLORIDE: 106 meq/L (ref 98–109)
CO2: 24 mEq/L (ref 22–29)
Calcium: 9.5 mg/dL (ref 8.4–10.4)
Creatinine: 1 mg/dL (ref 0.6–1.1)
EGFR: 70 mL/min/{1.73_m2} — AB (ref >90)
Glucose: 106 mg/dl (ref 70–140)
POTASSIUM: 4.3 meq/L (ref 3.5–5.1)
Sodium: 139 mEq/L (ref 136–145)
Total Bilirubin: 0.37 mg/dL (ref 0.20–1.20)
Total Protein: 7.2 g/dL (ref 6.4–8.3)

## 2016-07-24 LAB — CBC WITH DIFFERENTIAL/PLATELET
BASO%: 0.2 % (ref 0.0–2.0)
BASOS ABS: 0 10*3/uL (ref 0.0–0.1)
EOS ABS: 0.1 10*3/uL (ref 0.0–0.5)
EOS%: 1.4 % (ref 0.0–7.0)
HEMATOCRIT: 39.8 % (ref 34.8–46.6)
HGB: 13.7 g/dL (ref 11.6–15.9)
LYMPH#: 2 10*3/uL (ref 0.9–3.3)
LYMPH%: 23.7 % (ref 14.0–49.7)
MCH: 29.9 pg (ref 25.1–34.0)
MCHC: 34.4 g/dL (ref 31.5–36.0)
MCV: 86.9 fL (ref 79.5–101.0)
MONO#: 0.3 10*3/uL (ref 0.1–0.9)
MONO%: 3.9 % (ref 0.0–14.0)
NEUT#: 6.1 10*3/uL (ref 1.5–6.5)
NEUT%: 70.8 % (ref 38.4–76.8)
PLATELETS: 246 10*3/uL (ref 145–400)
RBC: 4.58 10*6/uL (ref 3.70–5.45)
RDW: 12.2 % (ref 11.2–14.5)
WBC: 8.6 10*3/uL (ref 3.9–10.3)

## 2016-07-24 MED FILL — CARVEDILOL 3.125 MG TABLET: 3.125 | 30 days supply | Qty: 60 | Fill #1

## 2016-07-24 NOTE — Progress Notes (Signed)
[place]  [phone] [phone]   ID: [name] DOB: 1971/06/24  MR#: [id number]  JJK#:[id number]  Patient Care Team: No Pcp Per Patient as PCP - General (General Practice) Autumn Messing III, MD as Consulting Physician (General Surgery) Chauncey Cruel, MD as Consulting Physician (Oncology) [name], MD as Consulting Physician (Radiation Oncology) Sylvan Cheese, NP as Nurse Practitioner (Hematology and Oncology) Autumn Messing III, MD as Consulting Physician (General Surgery) PCP: No PCP Per Patient GYN: OTHER MD:   CHIEF COMPLAINT: Phyllis Wiggins positive, estrogen and progesterone receptor negative breast cancer  CURRENT TREATMENT: anti-Phyllis Wiggins immunotherapy  BREAST CANCER HISTORY: From the original intake note:  Lavere noted a change in her right breast sometime in September 2016. She brought it to medical attention when she presented for the free Pap smear screening clinic here 06/01/2015. Exam on that day showed a lump in the right breast at the 4:00 position under the areola. There was also a scar on the right outer breast. This scar was noted in 01/04/2008 when she had her baseline mammogram at [facility]. It had been stable for a year at that time and the patient had treated it with a "blood root paste" which caused scarring. 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 It is in a separate location from the new mass.  On 06/05/2015 [name] underwent bilateral diagnostic mammography at [facility], with bilateral ultrasonography. This found the breast density to be category B. There was a 2.5 cm oval mass in the right breast at the 5:00 position which by ultrasound measured 2.1 cm. In the left breast there was an area of focal asymmetry which by ultrasound was consistent with fibroglandular tissue/benign.  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  Her subsequent history is as detailed below.  INTERVAL HISTORY: Phyllis Wiggins returns today for follow-up of her Phyllis Wiggins/neu positive breast cancer accompanied by her daughter. Her echocardiogram 06/19/2016 showed a drop in her ejection fraction to the 45-50% range. We held her Herceptin (her last dose was 06/12/2016). Repeat echo 07/18/2016 shows an EF of 50% and also the SPEP on has normalized. Nevertheless we decided to discontinue the Herceptin--she only had 2 doses left--and she is now ready to have her port removed.  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 She has sinus problems, minimal epistaxis, some forgetfulness, and a little bit of a rash in which she tells me he is due to grooming hair getting in under her clothes. She has some tendinitis in the left hand. She gives me no symptoms suggestive of congestive heart failure. Otherwise a detailed review of systems today was stable  PAST MEDICAL HISTORY: Past Medical History:  Diagnosis Date  . Arthritis   . Breast cancer ([place])   . Breast cancer of lower-outer quadrant of right female breast ([place]) 06/20/2015  . Family history of stomach cancer   . Hx of radiation therapy 12/18/15- 02/01/16   Right Breast    PAST SURGICAL HISTORY: Past Surgical History:  Procedure Laterality Date  . BREAST LUMPECTOMY WITH RADIOACTIVE SEED AND SENTINEL LYMPH NODE BIOPSY Right 11/09/2015   Procedure: RIGHT BREAST LUMPECTOMY WITH RADIOACTIVE SEED AND SENTINEL LYMPH NODE BIOPSY AND EXCISION OF RIGHT BREAST FIBROADENOMA;  Surgeon: Autumn Messing III, MD;  Location: [place];  Service: General;  Laterality: Right;  . PORTACATH PLACEMENT Left 07/06/2015   Procedure: INSERTION PORT-A-CATH;  Surgeon: Autumn Messing III, MD;  Location: [place];  Service: General;  Laterality:  Left;  . WISDOM TOOTH EXTRACTION      FAMILY HISTORY Family History  Problem Relation Age of Onset  . Diabetes Mother   . Hypertension Mother   . Hyperlipidemia Mother   . Stomach cancer Maternal Grandfather 63  . Diabetes Paternal Grandmother   . Dementia Maternal Uncle   . Congestive Heart Failure Maternal Grandmother    the patient's parents are both living, in their early 66s. The patient had no brother, one sister. The patient's mother works by [employer] for private clients. The patient's sister is a Pharmacist, hospital in [place] there is no history of breast or ovarian cancer in the family. The patient's paternal grandfather died at the age of 29 from stomach cancer felt to be related to iron unusual toxic exposure   GYNECOLOGIC HISTORY:  No LMP recorded. Patient is not currently having periods (Reason: Chemotherapy).  menarche age 9, she is [place] P0  she is still having periods but they're quite sporadic. They may occur 6 weeks apart or 3 weeks apart, may be heavy or only spotting. 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 She is divorced and at home lives with 6 dogs.     ADVANCED DIRECTIVES:  not in place    HEALTH MAINTENANCE: Social History  Substance Use Topics  . Smoking status: Former Smoker    Types: Cigarettes    Quit date: 07/04/2010  . Smokeless tobacco: Never Used  . Alcohol use No     Colonoscopy:  PAP: October 2016   Bone density:  Lipid panel:  Allergies  Allergen Reactions  . Cheese   . Dairy Aid [Lactase]     "sinus infections"  . Whey     Sinus issues  . 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 acetaminophen (TYLENOL) 650 MG CR tablet  Take 650 mg by mouth every 8 (eight) hours as needed for pain.    . Biotin 10000 MCG TABS Take 20,000 mcg by mouth.    . carvedilol (COREG) 3.125 MG tablet Take 1 tablet (3.125 mg total) by mouth 2 (two) times daily with a meal. 60 tablet 3  . lidocaine-prilocaine (EMLA) cream Apply to affected area once 30 g 3  . losartan (COZAAR) 25 MG tablet Take 1 tablet (25 mg total) by mouth at bedtime. 30 tablet 3  . meclizine (ANTIVERT) 25 MG tablet Take 25 mg by mouth as needed for dizziness. Reported on 01/31/2016     No current facility-administered medications for this visit.     OBJECTIVE: Middle-aged white woman Who appears well Vitals:   07/24/16 1256  BP: 111/67  Pulse: 89  Resp: 17  Temp: 98.6 F (37 C)     Body mass index is 38.09 kg/m.    ECOG FS:0 - Asymptomatic were   Sclerae unicteric, EOMs intact Oropharynx clear and moist No cervical or supraclavicular adenopathy Lungs no rales or rhonchi Heart regular rate and rhythm Abd  soft, nontender, positive bowel sounds MSK no focal spinal tenderness, no upper extremity lymphedema Neuro: nonfocal, well oriented, appropriate affect Breasts: The right breast is status post lumpectomy and radiation. There is minimal hyperpigmentation. There is some skin coarsening as is frequently seen, but there is no evidence of local recurrence. The right axilla is benign. The left breast is unremarkable. Skin: She has a light brown mole with a regular margins on the dorsum of her left forearm, which looks benign. I was unable to photograph it today as haiku was not working.   LAB RESULTS:  CMP     Component Value Date/Time   NA 139 07/24/2016 1244   K 4.3 07/24/2016 1244   CO2 24 07/24/2016 1244   GLUCOSE 106 07/24/2016 1244   BUN 19.6 07/24/2016 1244   CREATININE 1.0 07/24/2016 1244   CALCIUM 9.5 07/24/2016 1244   PROT 7.2 07/24/2016 1244   ALBUMIN 3.6 07/24/2016 1244   AST 16 07/24/2016 1244   ALT 19 07/24/2016 1244   ALKPHOS 84  07/24/2016 1244   BILITOT 0.37 07/24/2016 1244    INo results found for: SPEP, UPEP  Lab Results  Component Value Date   WBC 8.6 07/24/2016   NEUTROABS 6.1 07/24/2016   HGB 13.7 07/24/2016   HCT 39.8 07/24/2016   MCV 86.9 07/24/2016   PLT 246 07/24/2016      Chemistry      Component Value Date/Time   NA 139 07/24/2016 1244   K 4.3 07/24/2016 1244   CO2 24 07/24/2016 1244   BUN 19.6 07/24/2016 1244   CREATININE 1.0 07/24/2016 1244      Component Value Date/Time   CALCIUM 9.5 07/24/2016 1244   ALKPHOS 84 07/24/2016 1244   AST 16 07/24/2016 1244   ALT 19 07/24/2016 1244   BILITOT 0.37 07/24/2016 1244       No results found for: LABCA2  No components found for: LABCA125  No results for input(s): INR in the last 168 hours.  Urinalysis No results found for: COLORURINE, APPEARANCEUR, LABSPEC, PHURINE, GLUCOSEU, HGBUR, BILIRUBINUR, KETONESUR, PROTEINUR, UROBILINOGEN, NITRITE, LEUKOCYTESUR  STUDIES: Transthoracic Echocardiography  Patient:    Lashuna, Tamashiro MR #:       [id number] Study Date: 07/18/2016 Gender:     F Age:        45 Height:     170.2 cm Weight:     109.8 kg BSA:        2.33 m^2 Pt. Status: Room:   ORDERING     Bensimhon, Daniel  REFERRING    Bensimhon, Sterling [place], Outpatient  SONOGRAPHER  Madelin Rear, RDCS  ATTENDING    Filbert Schilder, April  cc:  ------------------------------------------------------------------- LV EF: 50%  ------------------------------------------------------------------- Indications:      V58.11 Chemotherapy Evaluation.  ------------------------------------------------------------------- History:   PMH:  Breast Cancer.  ------------------------------------------------------------------- Study Conclusions  - Left ventricle: The cavity size was normal. Systolic function was   normal. The estimated ejection fraction was 50%. Diffuse   hypokinesis. The tissue Doppler parameters were normal. Left    ventricular diastolic function parameters were normal. - Aortic valve: There was no regurgitation. - Aortic root: The aortic root was normal in size. - Ascending aorta: The ascending aorta was normal in size. - Left atrium: The atrium was normal in size. - Right ventricle: Systolic function was normal. - Tricuspid valve: There was mild regurgitation. - Pulmonic valve: There was no regurgitation. - Pulmonary arteries: Systolic pressure was within the normal  range. - Pericardium, extracardiac: There was no pericardial effusion.  Impressions:  - When compared to the prior study from 06/19/2016 LVEF remains   mildly decreased estimated at 50% with mild diffuse hypokinesis.   GLS strain has improved from -14.4% to -20.2 %.  ------------------------------------------------------------------- Study data:  Comparison was made to the study of 06/19/2016.  Study status:  Routine.  Procedure:  The patient reported no pain pre or post test. Transthoracic echocardiography. Image quality was adequate.  Study completion:  There were no complications. Transthoracic echocardiography.  M-mode, complete 2D, spectral Doppler, and color Doppler.  Birthdate:  Patient birthdate: 11-06-1970.  Age:  Patient is 45 yr old.  Sex:  Gender: female. BMI: 37.9 kg/m^2.  Blood pressure:     130/90  Patient status: Outpatient.  Study date:  Study date: 07/18/2016. Study time: 02:21 PM.  Location:  Echo laboratory.  -------------------------------------------------------------------  ------------------------------------------------------------------- Left ventricle:  The cavity size was normal. Systolic function was normal. The estimated ejection fraction was 50%. Diffuse hypokinesis. The transmitral flow pattern was normal. The deceleration time of the early transmitral flow velocity was normal. The pulmonary vein flow pattern was normal. The tissue Doppler parameters were normal. Left ventricular  diastolic function parameters were normal.  ------------------------------------------------------------------- Aortic valve:   Trileaflet; normal thickness leaflets. Mobility was not restricted.  Doppler:  Transvalvular velocity was within the normal range. There was no stenosis. There was no regurgitation.   ------------------------------------------------------------------- Aorta:  Aortic root: The aortic root was normal in size. Ascending aorta: The ascending aorta was normal in size.  ------------------------------------------------------------------- Mitral valve:   Structurally normal valve.   Mobility was not restricted.  Doppler:  Transvalvular velocity was within the normal range. There was no evidence for stenosis. There was trivial regurgitation.    Peak gradient (D): 4 mm Hg.  ------------------------------------------------------------------- Left atrium:  The atrium was normal in size.  ------------------------------------------------------------------- Right ventricle:  The cavity size was normal. Wall thickness was normal. Systolic function was normal.  ------------------------------------------------------------------- Pulmonic valve:    Structurally normal valve.   Cusp separation was normal.  Doppler:  Transvalvular velocity was within the normal range. There was no evidence for stenosis. There was no regurgitation.  ------------------------------------------------------------------- Tricuspid valve:   Structurally normal valve.    Doppler: Transvalvular velocity was within the normal range. There was mild regurgitation.  ------------------------------------------------------------------- Pulmonary artery:   The main pulmonary artery was normal-sized. Systolic pressure was within the normal range.  ------------------------------------------------------------------- Right atrium:  The atrium was normal in  size.  ------------------------------------------------------------------- Pericardium:  There was no pericardial effusion.  ------------------------------------------------------------------- Systemic veins: Inferior vena cava: The vessel was normal in size.  ------------------------------------------------------------------- Measurements   Left ventricle                          Value        Reference  LV ID, ED, PLAX chordal          (H)    55    mm     43 - 52  LV ID, ES, PLAX chordal          (H)    44.2  mm     23 - 38  LV fx shortening, PLAX chordal   (L)    20    %      >=29  LV PW thickness, ED  10.5  mm     ----------  IVS/LV PW ratio, ED                     0.79         <=1.3  Stroke volume, 2D                       70    ml     ----------  Stroke volume/bsa, 2D                   30    ml/m^2 ----------  LV ejection fraction, 1-p A4C           60    %      ----------  LV end-diastolic volume, 2-p            145   ml     ----------  LV end-systolic volume, 2-p             58    ml     ----------  LV ejection fraction, 2-p               60    %      ----------  Stroke volume, 2-p                      87    ml     ----------  LV end-diastolic volume/bsa, 2-p        62    ml/m^2 ----------  LV end-systolic volume/bsa, 2-p         25    ml/m^2 ----------  Stroke volume/bsa, 2-p                  37.5  ml/m^2 ----------  LV e&', lateral                          10.5  cm/s   ----------  LV E/e&', lateral                        9.24         ----------  LV e&', medial                           7.83  cm/s   ----------  LV E/e&', medial                         12.39        ----------  LV e&', average                          9.17  cm/s   ----------  LV E/e&', average                        10.58        ----------  Longitudinal strain, TDI                20    %      ----------    Ventricular septum                      Value        Reference  IVS thickness,  ED  8.31  mm     ----------    LVOT                                    Value        Reference  LVOT ID, S                              19    mm     ----------  LVOT area                               2.84  cm^2   ----------  LVOT peak velocity, S                   103   cm/s   ----------  LVOT mean velocity, S                   70.2  cm/s   ----------  LVOT VTI, S                             24.7  cm     ----------    Aorta                                   Value        Reference  Aortic root ID, ED                      35    mm     ----------    Left atrium                             Value        Reference  LA ID, A-P, ES                          34    mm     ----------  LA ID/bsa, A-P                          1.46  cm/m^2 <=2.2  LA volume, S                            49.2  ml     ----------  LA volume/bsa, S                        21.1  ml/m^2 ----------  LA volume, ES, 1-p A4C                  51.3  ml     ----------  LA volume/bsa, ES, 1-p A4C              22.1  ml/m^2 ----------  LA volume, ES, 1-p A2C                  44.4  ml     ----------  LA volume/bsa, ES, 1-p A2C  19.1  ml/m^2 ----------    Mitral valve                            Value        Reference  Mitral E-wave peak velocity             97    cm/s   ----------  Mitral A-wave peak velocity             74.7  cm/s   ----------  Mitral deceleration time         (H)    261   ms     150 - 230  Mitral peak gradient, D                 4     mm Hg  ----------  Mitral E/A ratio, peak                  1.3          ----------    Pulmonary arteries                      Value        Reference  PA pressure, S, DP                      29    mm Hg  <=30    Tricuspid valve                         Value        Reference  Tricuspid regurg peak velocity          253   cm/s   ----------  Tricuspid peak RV-RA gradient           26    mm Hg  ----------    Right atrium                            Value         Reference  RA ID, S-I, ES, A4C              (H)    54.9  mm     34 - 49  RA area, ES, A4C                        15.7  cm^2   8.3 - 19.5  RA volume, ES, A/L                      37.7  ml     ----------  RA volume/bsa, ES, A/L                  16.2  ml/m^2 ----------    Systemic veins                          Value        Reference  Estimated CVP                           3     mm Hg  ----------    Right ventricle  Value        Reference  TAPSE                                   32.6  mm     ----------  RV pressure, S, DP                      29    mm Hg  <=30  RV s&', lateral, S                       18    cm/s   ----------  Legend: (L)  and  (H)  mark values outside specified reference range.  ------------------------------------------------------------------- Prepared and Electronically Authenticated by  Ena Dawley, M.D. 2017-12-14T16:11:05     ASSESSMENT: 45 y.o. BRCA negative [place] woman status post left breast lower outer quadrant biopsy 06/15/2015 for a clinical T1 cN0, stage IA invasive ductal carcinoma, grade 3, estrogen and progesterone receptor negative, Phyllis Wiggins amplified, with an MIB-1 of 40%  (1) neoadjuvant chemotherapy consisting of carboplatin, docetaxel, pertuzumab, and trastuzumab with onpro support every 3 weeks 6 started 07/11/2015, last dose T/P 10/25/2015  (a) carboplatin and docetaxel stopped after 5 cycles due to patient intolerance  (2) Status post right lumpectomy and sentinel lymph node sampling for 01/23/2016 showing a complete pathologic response in the breast and 3 sentinel lymph nodes sampled, ypT0 ypN0  (3) adjuvant radiation completed 01/31/2016  (4) trastuzumab started 11/14/2015, to be continued to mid December 2017  (a) echocardiogram 03/21/2016 shows an ejection fraction of 55-60%  (5) genetics testing 08/16/2015 through the Breast/Ovarian gene panel offered by [facility] i found no deleterious mutations in  ATM, BARD1, BRCA1, BRCA2, BRIP1, CDH1, CHEK2, EPCAM, FANCC, MLH1, MSH2, MSH6, NBN, PALB2, PMS2, PTEN, RAD51C, RAD51D, TP53, and XRCC2.   PLAN: Anwitha is done with Herceptin. She pretty much of the full year minus a couple of doses and that is very adequate.  Her heart function is already normalizing and I do not doubt that it will be fully back to normal next time it is checked. She is being closely followed by Dr. Haroldine Laws Re: that  I encouraged her to start an exercise program and she has both an outdoor bite and some equipment that she is beginning to make room for in her home. If she is persistent and doesn't overstress herself at the beginning she will feel considerably better by the time she sees me again in May.  She is a bit behind on mammography and I have put her in for mammography at [facility] this month  Otherwise she knows to call for any problems that may develop before her next visit here.    Chauncey Cruel, MD   07/24/2016 1:40 PM

## 2016-07-29 ENCOUNTER — Ambulatory Visit: Payer: Self-pay | Admitting: General Surgery

## 2016-08-19 ENCOUNTER — Other Ambulatory Visit: Payer: Self-pay | Admitting: Radiology

## 2016-08-20 ENCOUNTER — Encounter (HOSPITAL_BASED_OUTPATIENT_CLINIC_OR_DEPARTMENT_OTHER): Payer: Self-pay | Admitting: *Deleted

## 2016-08-26 ENCOUNTER — Encounter (HOSPITAL_BASED_OUTPATIENT_CLINIC_OR_DEPARTMENT_OTHER): Payer: Self-pay | Admitting: Certified Registered"

## 2016-08-26 ENCOUNTER — Ambulatory Visit (HOSPITAL_BASED_OUTPATIENT_CLINIC_OR_DEPARTMENT_OTHER)
Admission: RE | Admit: 2016-08-26 | Discharge: 2016-08-26 | Disposition: A | Discharge status: Home / Self Care | Payer: Medicaid Other | Source: Ambulatory Visit | Attending: General Surgery | Admitting: General Surgery

## 2016-08-26 ENCOUNTER — Ambulatory Visit (HOSPITAL_BASED_OUTPATIENT_CLINIC_OR_DEPARTMENT_OTHER): Payer: Medicaid Other | Admitting: Certified Registered"

## 2016-08-26 ENCOUNTER — Encounter (HOSPITAL_BASED_OUTPATIENT_CLINIC_OR_DEPARTMENT_OTHER)
Admission: RE | Disposition: A | Discharge status: Home / Self Care | Payer: Self-pay | Source: Ambulatory Visit | Attending: General Surgery

## 2016-08-26 DIAGNOSIS — K219 Gastro-esophageal reflux disease without esophagitis: Secondary | ICD-10-CM | POA: Insufficient documentation

## 2016-08-26 DIAGNOSIS — Z452 Encounter for adjustment and management of vascular access device: Secondary | ICD-10-CM | POA: Insufficient documentation

## 2016-08-26 DIAGNOSIS — Z6838 Body mass index (BMI) 38.0-38.9, adult: Secondary | ICD-10-CM | POA: Diagnosis not present

## 2016-08-26 DIAGNOSIS — Z9221 Personal history of antineoplastic chemotherapy: Secondary | ICD-10-CM | POA: Diagnosis not present

## 2016-08-26 DIAGNOSIS — Z87891 Personal history of nicotine dependence: Secondary | ICD-10-CM | POA: Diagnosis not present

## 2016-08-26 DIAGNOSIS — Z923 Personal history of irradiation: Secondary | ICD-10-CM | POA: Insufficient documentation

## 2016-08-26 DIAGNOSIS — Z853 Personal history of malignant neoplasm of breast: Secondary | ICD-10-CM | POA: Insufficient documentation

## 2016-08-26 HISTORY — DX: Gastro-esophageal reflux disease without esophagitis: K21.9

## 2016-08-26 HISTORY — PX: PORT-A-CATH REMOVAL: SHX5289

## 2016-08-26 SURGERY — REMOVAL PORT-A-CATH
Anesthesia: General | Site: Chest | Laterality: Left

## 2016-08-26 MED ORDER — MEPERIDINE HCL 25 MG/ML IJ SOLN: 6.2500 mg | INTRAMUSCULAR | Status: DC | PRN

## 2016-08-26 MED ORDER — LIDOCAINE HCL (CARDIAC) 20 MG/ML IV SOLN
INTRAVENOUS | Status: DC | PRN
  Administered 2016-08-26: 60 mg via INTRAVENOUS

## 2016-08-26 MED ORDER — CHLORHEXIDINE GLUCONATE CLOTH 2 % EX PADS: 6.0000 | MEDICATED_PAD | Freq: Once | CUTANEOUS | Status: DC

## 2016-08-26 MED ORDER — OXYCODONE HCL 5 MG PO TABS: 5.0000 mg | ORAL_TABLET | Freq: Once | ORAL | Status: DC | PRN

## 2016-08-26 MED ORDER — CELECOXIB 400 MG PO CAPS
400.0000 mg | ORAL_CAPSULE | ORAL | Status: AC
  Administered 2016-08-26: 400 mg via ORAL

## 2016-08-26 MED ORDER — CELECOXIB 200 MG PO CAPS
ORAL_CAPSULE | ORAL | Status: AC
Start: 2016-08-26 — End: 2016-08-26
  Filled 2016-08-26: qty 2

## 2016-08-26 MED ORDER — DEXAMETHASONE SODIUM PHOSPHATE 4 MG/ML IJ SOLN
INTRAMUSCULAR | Status: DC | PRN
  Administered 2016-08-26: 10 mg via INTRAVENOUS

## 2016-08-26 MED ORDER — BUPIVACAINE HCL (PF) 0.25 % IJ SOLN
INTRAMUSCULAR | Status: DC | PRN
  Administered 2016-08-26: 10 mL

## 2016-08-26 MED ORDER — SCOPOLAMINE 1 MG/3DAYS TD PT72: 1.0000 | MEDICATED_PATCH | Freq: Once | TRANSDERMAL | Status: DC | PRN

## 2016-08-26 MED ORDER — ACETAMINOPHEN 500 MG PO TABS
1000.0000 mg | ORAL_TABLET | ORAL | Status: AC
  Administered 2016-08-26: 1000 mg via ORAL

## 2016-08-26 MED ORDER — OXYCODONE HCL 5 MG/5ML PO SOLN: 5.0000 mg | Freq: Once | ORAL | Status: DC | PRN

## 2016-08-26 MED ORDER — LIDOCAINE 2% (20 MG/ML) 5 ML SYRINGE
INTRAMUSCULAR | Status: AC
  Filled 2016-08-26: qty 20

## 2016-08-26 MED ORDER — HYDROMORPHONE HCL 1 MG/ML IJ SOLN: 0.2500 mg | INTRAMUSCULAR | Status: DC | PRN

## 2016-08-26 MED ORDER — ONDANSETRON HCL 4 MG/2ML IJ SOLN
INTRAMUSCULAR | Status: DC | PRN
  Administered 2016-08-26: 4 mg via INTRAVENOUS

## 2016-08-26 MED ORDER — PROPOFOL 500 MG/50ML IV EMUL
INTRAVENOUS | Status: AC
  Filled 2016-08-26: qty 150

## 2016-08-26 MED ORDER — PROPOFOL 10 MG/ML IV BOLUS
INTRAVENOUS | Status: DC | PRN
  Administered 2016-08-26: 200 mg via INTRAVENOUS

## 2016-08-26 MED ORDER — ACETAMINOPHEN 500 MG PO TABS
ORAL_TABLET | ORAL | Status: AC
  Filled 2016-08-26: qty 2

## 2016-08-26 MED ORDER — FENTANYL CITRATE (PF) 100 MCG/2ML IJ SOLN
INTRAMUSCULAR | Status: AC
  Filled 2016-08-26: qty 2

## 2016-08-26 MED ORDER — PROMETHAZINE HCL 25 MG/ML IJ SOLN: 6.2500 mg | INTRAMUSCULAR | Status: DC | PRN

## 2016-08-26 MED ORDER — MIDAZOLAM HCL 2 MG/2ML IJ SOLN
INTRAMUSCULAR | Status: AC
  Filled 2016-08-26: qty 2

## 2016-08-26 MED ORDER — LACTATED RINGERS IV SOLN
INTRAVENOUS | Status: DC
  Administered 2016-08-26: 08:00:00 via INTRAVENOUS

## 2016-08-26 MED ORDER — FENTANYL CITRATE (PF) 100 MCG/2ML IJ SOLN
50.0000 ug | INTRAMUSCULAR | Status: DC | PRN
  Administered 2016-08-26: 100 ug via INTRAVENOUS

## 2016-08-26 MED ORDER — MIDAZOLAM HCL 2 MG/2ML IJ SOLN
1.0000 mg | INTRAMUSCULAR | Status: DC | PRN
  Administered 2016-08-26: 2 mg via INTRAVENOUS

## 2016-08-26 MED ORDER — HYDROCODONE-ACETAMINOPHEN 5-325 MG PO TABS: 1.0000 | ORAL_TABLET | ORAL | 0 refills | Status: DC | PRN

## 2016-08-26 MED FILL — CARVEDILOL 3.125 MG TABLET: 3.125 | 30 days supply | Qty: 60 | Fill #2

## 2016-08-26 MED FILL — LOSARTAN POTASSIUM 25 MG TA: 25 | 30 days supply | Qty: 30 | Fill #1

## 2016-08-26 SURGICAL SUPPLY — 26 items
BLADE SURG 15 STRL LF DISP TIS (BLADE) ×1 IMPLANT
BLADE SURG 15 STRL SS (BLADE) ×2
CHLORAPREP W/TINT 26ML (MISCELLANEOUS) ×3 IMPLANT
COVER BACK TABLE 60X90IN (DRAPES) ×3 IMPLANT
COVER MAYO STAND STRL (DRAPES) ×3 IMPLANT
DECANTER SPIKE VIAL GLASS SM (MISCELLANEOUS) IMPLANT
DERMABOND ADVANCED (GAUZE/BANDAGES/DRESSINGS) ×2
DERMABOND ADVANCED .7 DNX12 (GAUZE/BANDAGES/DRESSINGS) ×1 IMPLANT
DRAPE LAPAROTOMY 100X72 PEDS (DRAPES) ×3 IMPLANT
DRAPE UTILITY XL STRL (DRAPES) ×3 IMPLANT
ELECT COATED BLADE 2.86 ST (ELECTRODE) IMPLANT
ELECT REM PT RETURN 9FT ADLT (ELECTROSURGICAL)
ELECTRODE REM PT RTRN 9FT ADLT (ELECTROSURGICAL) IMPLANT
GLOVE BIO SURGEON STRL SZ7.5 (GLOVE) ×3 IMPLANT
GOWN STRL REUS W/ TWL LRG LVL3 (GOWN DISPOSABLE) ×2 IMPLANT
GOWN STRL REUS W/TWL LRG LVL3 (GOWN DISPOSABLE) ×4
NEEDLE HYPO 25X1 1.5 SAFETY (NEEDLE) ×3 IMPLANT
PACK BASIN DAY SURGERY FS (CUSTOM PROCEDURE TRAY) ×3 IMPLANT
PENCIL BUTTON HOLSTER BLD 10FT (ELECTRODE) IMPLANT
SLEEVE SCD COMPRESS KNEE MED (MISCELLANEOUS) IMPLANT
SUT MON AB 4-0 PC3 18 (SUTURE) ×3 IMPLANT
SUT VIC AB 3-0 SH 27 (SUTURE) ×2
SUT VIC AB 3-0 SH 27X BRD (SUTURE) ×1 IMPLANT
SYR CONTROL 10ML LL (SYRINGE) ×3 IMPLANT
TOWEL OR 17X24 6PK STRL BLUE (TOWEL DISPOSABLE) ×3 IMPLANT
TOWEL OR NON WOVEN STRL DISP B (DISPOSABLE) IMPLANT

## 2016-08-26 NOTE — Anesthesia Procedure Notes (Signed)
Procedure Name: LMA Insertion Date/Time: 08/26/2016 8:36 AM Performed by: Ramonica Grigg D Pre-anesthesia Checklist: Patient identified, Emergency Drugs available, Suction available and Patient being monitored Patient Re-evaluated:Patient Re-evaluated prior to inductionOxygen Delivery Method: Circle system utilized Preoxygenation: Pre-oxygenation with 100% oxygen Intubation Type: IV induction Ventilation: Mask ventilation without difficulty LMA: LMA inserted LMA Size: 4.0 Number of attempts: 1 Airway Equipment and Method: Bite block Placement Confirmation: positive ETCO2 Tube secured with: Tape Dental Injury: Teeth and Oropharynx as per pre-operative assessment

## 2016-08-26 NOTE — Interval H&P Note (Signed)
History and Physical Interval Note:  08/26/2016 8:24 AM  Phyllis Wiggins  has presented today for surgery, with the diagnosis of History of right breast cancer  The various methods of treatment have been discussed with the patient and family. After consideration of risks, benefits and other options for treatment, the patient has consented to  Procedure(s): REMOVAL PORT-A-CATH (N/A) as a surgical intervention .  The patient's history has been reviewed, patient examined, no change in status, stable for surgery.  I have reviewed the patient's chart and labs.  Questions were answered to the patient's satisfaction.     TOTH III,Jazzie Trampe S

## 2016-08-26 NOTE — H&P (Signed)
Phyllis Wiggins  Location: [facility] Patient #: [id number] DOB: 1971-05-22 Divorced / Language: [name] / Race: White Female   History of Present Illness The patient is a 46 year old female who presents for a follow-up for Breast cancer. The patient is a 46 year old white female who is about 3 months status post right lumpectomy and sentinel node mapping for a T2 N0 right breast cancer. She was ER/PR negative and HER-2 positive with a Ki-67 of 40%. She received neoadjuvant chemotherapy and had a complete pathologic response. She finished radiation therapy about 3 weeks ago. She is still receiving Herceptin and is due for another echocardiogram in about 2 months.   Allergies WINE  Whey Protein *NUTRIENTS*  No Known Drug Allergies   Medication History Mucinex Fast-Max Day/Night ((Tablet) Misc, Oral) Active. Medications Reconciled    Review of Systems General Present- Night Sweats. Not Present- Appetite Loss, Chills, Fatigue, Fever, Weight Gain and Weight Loss. Skin Present- Change in Wart/Mole. Not Present- Dryness, Hives, Jaundice, New Lesions, Non-Healing Wounds, Rash and Ulcer. HEENT Present- Hearing Loss and Wears glasses/contact lenses. Not Present- Earache, Hoarseness, Nose Bleed, Oral Ulcers, Ringing in the Ears, Seasonal Allergies, Sinus Pain, Sore Throat, Visual Disturbances and Yellow Eyes. Respiratory Present- Snoring. Not Present- Bloody sputum, Chronic Cough, Difficulty Breathing and Wheezing. Breast Present- Breast Mass and Breast Pain. Not Present- Nipple Discharge and Skin Changes. Cardiovascular Present- Leg Cramps. Not Present- Chest Pain, Difficulty Breathing Lying Down, Palpitations, Rapid Heart Rate, Shortness of Breath and Swelling of Extremities. Gastrointestinal Not Present- Abdominal Pain, Bloating, Bloody Stool, Change in Bowel Habits, Chronic diarrhea, Constipation, Difficulty Swallowing, Excessive gas, Gets full quickly at meals, Hemorrhoids,  Indigestion, Nausea, Rectal Pain and Vomiting. Female Genitourinary Not Present- Frequency, Nocturia, Painful Urination, Pelvic Pain and Urgency. Musculoskeletal Not Present- Back Pain, Joint Pain, Joint Stiffness, Muscle Pain, Muscle Weakness and Swelling of Extremities. Neurological Not Present- Decreased Memory, Fainting, Headaches, Numbness, Seizures, Tingling, Tremor, Trouble walking and Weakness. Endocrine Present- Heat Intolerance. Not Present- Cold Intolerance, Excessive Hunger, Hair Changes, Hot flashes and New Diabetes. Hematology Not Present- Easy Bruising, Excessive bleeding, Gland problems, HIV and Persistent Infections.  Vitals  Weight: 225 lb Height: 67in Body Surface Area: 2.13 m Body Mass Index: 35.24 kg/m  Temp.: 97.52F(Temporal)  Pulse: 86 (Regular)  BP: 126/72 (Sitting, Left Arm, Standard)       Physical Exam  General Mental Status-Alert. General Appearance-Consistent with stated age. Hydration-Well hydrated. Voice-Normal.  Head and Neck Head-normocephalic, atraumatic with no lesions or palpable masses. Trachea-midline. Thyroid Gland Characteristics - normal size and consistency.  Eye Eyeball - Bilateral-Extraocular movements intact. Sclera/Conjunctiva - Bilateral-No scleral icterus.  Chest and Lung Exam Chest and lung exam reveals -quiet, even and easy respiratory effort with no use of accessory muscles and on auscultation, normal breath sounds, no adventitious sounds and normal vocal resonance. Inspection Chest Wall - Normal. Back - normal.  Breast Note: The right breast and axillary incisions have healed nicely with no sign of infection or seroma. The right breast is soft even though she just finished radiation therapy. There is no palpable mass in either breast. There is no palpable axillary, supraclavicular, or cervical lymphadenopathy.   Cardiovascular Cardiovascular examination reveals -normal heart sounds,  regular rate and rhythm with no murmurs and normal pedal pulses bilaterally.  Abdomen Inspection Inspection of the abdomen reveals - No Hernias. Skin - Scar - no surgical scars. Palpation/Percussion Palpation and Percussion of the abdomen reveal - Soft, Non Tender, No Rebound tenderness, No Rigidity (guarding)  and No hepatosplenomegaly. Auscultation Auscultation of the abdomen reveals - Bowel sounds normal.  Neurologic Neurologic evaluation reveals -alert and oriented x 3 with no impairment of recent or remote memory. Mental Status-Normal.  Musculoskeletal Normal Exam - Left-Upper Extremity Strength Normal and Lower Extremity Strength Normal. Normal Exam - Right-Upper Extremity Strength Normal and Lower Extremity Strength Normal.  Lymphatic Head & Neck  General Head & Neck Lymphatics: Bilateral - Description - Normal. Axillary  General Axillary Region: Bilateral - Description - Normal. Tenderness - Non Tender. Femoral & Inguinal  Generalized Femoral & Inguinal Lymphatics: Bilateral - Description - Normal. Tenderness - Non Tender.    Assessment & Plan  BREAST CANCER OF LOWER-INNER QUADRANT OF RIGHT FEMALE BREAST (C50.311) Impression: The patient is about 3 months status post right lumpectomy for breast cancer. She continues to do well. She had a complete pathologic response with neoadjuvant chemotherapy. At this point she will continue to do regular self exams. She has finished radiation therapy. She will continue to receive Herceptin. I will plan to see her back in about 6 months. Current Plans Follow up with [place] in the office in 6 MONTHS.  Call us sooner as needed.  For port removal today

## 2016-08-26 NOTE — Op Note (Signed)
08/26/2016  9:11 AM  PATIENT:  Phyllis Wiggins  46 y.o. female  PRE-OPERATIVE DIAGNOSIS:  History of right breast cancer  POST-OPERATIVE DIAGNOSIS:  History of right breast cancer  PROCEDURE:  Procedure(s): REMOVAL PORT-A-CATH (Left)  SURGEON:  Surgeon(s) and Role:    * Jovita Kussmaul, MD - Primary  PHYSICIAN ASSISTANT:   ASSISTANTS: none   ANESTHESIA:   general  EBL:  No intake/output data recorded.  BLOOD ADMINISTERED:none  DRAINS: none   LOCAL MEDICATIONS USED:  MARCAINE     SPECIMEN:  No Specimen  DISPOSITION OF SPECIMEN:  N/A  COUNTS:  YES  TOURNIQUET:  * No tourniquets in log *  DICTATION: .Dragon Dictation   After informed consent was obtained the patient was brought to the operating room and placed in the supine position on the operating table. After adequate induction of general anesthesia the patient's left chest wall was prepped with ChloraPrep, allowed to dry, and draped in usual sterile manner. An appropriate timeout was performed. The area around the port was then infiltrated with quarter percent Marcaine. A small incision was made through her old incision with a 15 by knife. The incision was carried through the subcutaneous tissue sharply with the 15 blade knife until the port was encountered. The capsule surrounding the port was opened sharply with the 15 blade knife. The 2 anchoring stitches were divided and removed. The port was then gently pushed out of its pocket and with gentle traction was removed from the patient without difficulty. Pressure was held for several minutes until the area was completely hemostatic. The deep layer of the wound was then closed with interrupted 3-0 Vicryl stitches. The skin was then closed with interrupted 4-0 Monocryl subcuticular stitches. Dermabond dressings were applied. The patient tolerated the procedure well. At the end of the case all needle sponge and instrument counts were correct. The patient was then awakened and taken  to recovery in stable condition.  PLAN OF CARE: Discharge to home after PACU  PATIENT DISPOSITION:  PACU - hemodynamically stable.   Delay start of Pharmacological VTE agent (>24hrs) due to surgical blood loss or risk of bleeding: not applicable

## 2016-08-26 NOTE — Transfer of Care (Signed)
Immediate Anesthesia Transfer of Care Note  Patient: Phyllis Wiggins  Procedure(s) Performed: Procedure(s): REMOVAL PORT-A-CATH (Left)  Patient Location: PACU  Anesthesia Type:General  Level of Consciousness: awake, alert , oriented and patient cooperative  Airway & Oxygen Therapy: Patient Spontanous Breathing and Patient connected to face mask oxygen  Post-op Assessment: Report given to RN and Post -op Vital signs reviewed and stable  Post vital signs: Reviewed and stable  Last Vitals:  Vitals:   08/26/16 0749  BP: 118/67  Pulse: 72  Resp: 18  Temp: 36.6 C    Last Pain:  Vitals:   08/26/16 0749  TempSrc: Oral         Complications: No apparent anesthesia complications

## 2016-08-26 NOTE — Anesthesia Postprocedure Evaluation (Signed)
Anesthesia Post Note  Patient: Phyllis Wiggins  Procedure(s) Performed: Procedure(s) (LRB): REMOVAL PORT-A-CATH (Left)  Patient location during evaluation: PACU Anesthesia Type: General Level of consciousness: sedated and patient cooperative Pain management: pain level controlled Vital Signs Assessment: post-procedure vital signs reviewed and stable Respiratory status: spontaneous breathing Cardiovascular status: stable Anesthetic complications: no       Last Vitals:  Vitals:   08/26/16 0930 08/26/16 0952  BP: 113/73 105/71  Pulse: 83 63  Resp: 17 16  Temp: 36.6 C 36.7 C    Last Pain:  Vitals:   08/26/16 0952  TempSrc:   PainSc: 0-No pain                 Nolon Nations

## 2016-08-26 NOTE — Anesthesia Preprocedure Evaluation (Addendum)
Anesthesia Evaluation  Patient identified by MRN, date of birth, ID band Patient awake    Reviewed: Allergy & Precautions, NPO status , Patient's Chart, lab work & pertinent test results  Airway Mallampati: II  TM Distance: >3 FB Neck ROM: Full    Dental no notable dental hx.    Pulmonary neg pulmonary ROS, former smoker,    Pulmonary exam normal breath sounds clear to auscultation       Cardiovascular negative cardio ROS Normal cardiovascular exam Rhythm:Regular Rate:Normal     Neuro/Psych negative neurological ROS  negative psych ROS   GI/Hepatic negative GI ROS, Neg liver ROS, GERD  Medicated,  Endo/Other  negative endocrine ROS  Renal/GU negative Renal ROS     Musculoskeletal negative musculoskeletal ROS (+) Arthritis ,   Abdominal (+) + obese,   Peds  Hematology negative hematology ROS (+)   Anesthesia Other Findings   Reproductive/Obstetrics negative OB ROS                                                             Anesthesia Evaluation    Airway        Dental   Pulmonary former smoker,           Cardiovascular      Neuro/Psych    GI/Hepatic GERD  Medicated,  Endo/Other    Renal/GU      Musculoskeletal  (+) Arthritis ,   Abdominal   Peds  Hematology   Anesthesia Other Findings   Reproductive/Obstetrics                             Anesthesia Physical Anesthesia Plan Anesthesia Quick Evaluation  Anesthesia Physical Anesthesia Plan  ASA: II  Anesthesia Plan: General   Post-op Pain Management:    Induction: Intravenous  Airway Management Planned: LMA  Additional Equipment:   Intra-op Plan:   Post-operative Plan: Extubation in OR  Informed Consent: I have reviewed the patients History and Physical, chart, labs and discussed the procedure including the risks, benefits and alternatives for the proposed  anesthesia with the patient or authorized representative who has indicated his/her understanding and acceptance.   Dental advisory given  Plan Discussed with: CRNA  Anesthesia Plan Comments:         Anesthesia Quick Evaluation                                   Anesthesia Evaluation  Patient identified by MRN, date of birth, ID band Patient awake    Reviewed: Allergy & Precautions, NPO status , Patient's Chart, lab work & pertinent test results  Airway Mallampati: II  TM Distance: >3 FB Neck ROM: Full    Dental no notable dental hx. (+) Dental Advisory Given   Pulmonary former smoker,    Pulmonary exam normal        Cardiovascular negative cardio ROS Normal cardiovascular exam     Neuro/Psych  Neuromuscular disease negative psych ROS   GI/Hepatic negative GI ROS, Neg liver ROS,   Endo/Other  Morbid obesity  Renal/GU negative Renal ROS     Musculoskeletal  (+) Arthritis ,   Abdominal   Peds  Hematology  Anesthesia Other Findings   Reproductive/Obstetrics                            Anesthesia Physical  Anesthesia Plan  ASA: II  Anesthesia Plan: General   Post-op Pain Management: GA combined w/ Regional for post-op pain   Induction: Intravenous  Airway Management Planned: LMA  Additional Equipment:   Intra-op Plan:   Post-operative Plan: Extubation in OR  Informed Consent: I have reviewed the patients History and Physical, chart, labs and discussed the procedure including the risks, benefits and alternatives for the proposed anesthesia with the patient or authorized representative who has indicated his/her understanding and acceptance.   Dental advisory given  Plan Discussed with: Anesthesiologist, CRNA and Surgeon  Anesthesia Plan Comments:        Anesthesia Quick Evaluation

## 2016-08-27 ENCOUNTER — Encounter (HOSPITAL_BASED_OUTPATIENT_CLINIC_OR_DEPARTMENT_OTHER): Payer: Self-pay | Admitting: General Surgery

## 2016-09-06 ENCOUNTER — Telehealth: Payer: Self-pay | Admitting: Oncology

## 2016-09-06 ENCOUNTER — Encounter: Payer: Self-pay | Admitting: *Deleted

## 2016-09-06 NOTE — Telephone Encounter (Signed)
sw pt to confirm 3/9 scp appt per LOS

## 2016-09-19 ENCOUNTER — Ambulatory Visit (HOSPITAL_COMMUNITY)
Admission: RE | Admit: 2016-09-19 | Discharge: 2016-09-19 | Disposition: A | Discharge status: Home / Self Care | Payer: Medicaid Other | Source: Ambulatory Visit | Attending: Internal Medicine | Admitting: Internal Medicine

## 2016-09-19 ENCOUNTER — Ambulatory Visit (HOSPITAL_BASED_OUTPATIENT_CLINIC_OR_DEPARTMENT_OTHER)
Admission: RE | Admit: 2016-09-19 | Discharge: 2016-09-19 | Disposition: A | Discharge status: Home / Self Care | Payer: Medicaid Other | Source: Ambulatory Visit | Attending: Internal Medicine | Admitting: Internal Medicine

## 2016-09-19 VITALS — BP 128/98 | HR 104 | Wt 244.0 lb

## 2016-09-19 DIAGNOSIS — C50511 Malignant neoplasm of lower-outer quadrant of right female breast: Secondary | ICD-10-CM

## 2016-09-19 DIAGNOSIS — I427 Cardiomyopathy due to drug and external agent: Secondary | ICD-10-CM

## 2016-09-19 DIAGNOSIS — T451X5A Adverse effect of antineoplastic and immunosuppressive drugs, initial encounter: Secondary | ICD-10-CM | POA: Diagnosis not present

## 2016-09-19 NOTE — Progress Notes (Signed)
  Echocardiogram 2D Echocardiogram has been performed.  Tresa Res 09/19/2016, 9:45 AM

## 2016-09-19 NOTE — Progress Notes (Signed)
CARDIO-ONCOLOGY CLINIC CONSULT NOTE  Referring Physician: Magrinat   HPI:  Phyllis Wiggins is a 46 y.o. Phyllis Wiggins woman with breast cancer who is referred to cardio-oncology clinic by Dr. Jana Hakim.  She is status post left breast biopsy 06/15/2015 for a clinical T1 cN0, stage IA invasive ductal carcinoma, grade 3, estrogen and progesterone receptor negative, HER-2 amplified, with an MIB-1 of 40%  (1) neoadjuvant chemotherapy to consist of carboplatin, docetaxel, pertuzumab, and trastuzumab with onpro support every 3 weeks  5 started 07/11/2015, last dose T/P 10/25/2015  (a) carboplatin and docetaxel stopped after 5 cycles due to patient intolerance  (2) Status post right lumpectomy and sentinel lymph node sampling for 01/23/2016 showing a complete pathologic response in the breast and 3 sentinel lymph nodes sampled, ypT0 ypN0  (3) adjuvant radiation to follow   (4) trastuzumab started 11/14/2015, to be continued to mid December 2017  (5) genetics testing 08/16/2015 through the Breast/Ovarian gene panel offered by [facility] i found no deleterious mutations  Previously EF was felt to be a little down. Started carvedilol and losartan.  Herceptin was stopped (only had 2 doses left).   Here for f/u. Feels fine. Thinking of kickboxing and Pilates. No SOB or CP. No edema.    Echo 12/16: EF 55-60% RV normal  Lat s' 10.6 GLS -19.8 Echo 6/17: EF 50-55% RV normal Lat s' 10.95 GLS - 21.4 (no 4 chamber) Echo 8/17: EF 55-60% RV normal  Lat s' 12.0 GLS -18.9% Echo 11/17: EF 45-50% RV normal. Lat s' 9.7 GLS -14.4% Echo 07/18/16 Ef 45-50% LS' 11.7 GLS -20.2% ECHO 09/19/16 EF 50-55  GLS -18.4%   Past Medical History:  Diagnosis Date  . Arthritis   . Breast cancer ([place])   . Breast cancer of lower-outer quadrant of right female breast ([place]) 06/20/2015  . Family history of stomach cancer   . GERD (gastroesophageal reflux disease)   . Hx of radiation therapy 12/18/15- 02/01/16   Right Breast    Current  Outpatient Prescriptions  Medication Sig Dispense Refill  . acetaminophen (TYLENOL) 650 MG CR tablet Take 650 mg by mouth every 8 (eight) hours as needed for pain.    . Biotin 10000 MCG TABS Take 20,000 mcg by mouth.    . carvedilol (COREG) 3.125 MG tablet Take 1 tablet (3.125 mg total) by mouth 2 (two) times daily with a meal. 60 tablet 3  . HYDROcodone-acetaminophen (NORCO/VICODIN) 5-325 MG tablet Take 1-2 tablets by mouth every 4 (four) hours as needed for moderate pain or severe pain. 10 tablet 0  . lidocaine-prilocaine (EMLA) cream Apply to affected area once 30 g 3  . losartan (COZAAR) 25 MG tablet Take 1 tablet (25 mg total) by mouth at bedtime. 30 tablet 3  . meclizine (ANTIVERT) 25 MG tablet Take 25 mg by mouth as needed for dizziness. Reported on 01/31/2016     No current facility-administered medications for this encounter.     Allergies  Allergen Reactions  . Cheese   . Dairy Aid [Lactase]     "sinus infections"  . Whey     Sinus issues  . Wine [Alcohol]     Severe sinus issues due to fermentation      Social History   Social History  . Marital status: Divorced    Spouse name: N/A  . Number of children: 0  . Years of education: N/A   Occupational History  . Not on file.   Social History Main Topics  . Smoking status: Former  Smoker    Types: Cigarettes    Quit date: 07/04/2010  . Smokeless tobacco: Never Used  . Alcohol use No  . Drug use: No  . Sexual activity: Not Currently    Birth control/ protection: None   Other Topics Concern  . Not on file   Social History Narrative  . No narrative on file      Family History  Problem Relation Age of Onset  . Diabetes Mother   . Hypertension Mother   . Hyperlipidemia Mother   . Stomach cancer Maternal Grandfather 39  . Diabetes Paternal Grandmother   . Dementia Maternal Uncle   . Congestive Heart Failure Maternal Grandmother     Vitals:   09/19/16 0954  BP: (!) 128/98  Pulse: (!) 104  SpO2: 96%    Weight: 244 lb (110.7 kg)    PHYSICAL EXAM: General:  Well appearing. No respiratory difficulty. Mother present  HEENT: normal Neck: supple. no JVD. Carotids 2+ bilat; no bruits. No lymphadenopathy or thryomegaly appreciated. Cor: PMI nondisplaced. Regular rate & rhythm. No rubs, gallops or murmurs. Lungs: clear Abdomen: obese soft, nontender, nondistended. No hepatosplenomegaly. No bruits or masses. Good bowel sounds. Extremities: no cyanosis, clubbing, rash, no edema Neuro: alert & oriented x 3, cranial nerves grossly intact. moves all 4 extremities w/o difficulty. Affect pleasant.     ASSESSMENT & PLAN:  1. Breast CA (ER/PR -/HER 2-neu +) --she has completed therapy.  --I have reviewed echos personally and EF low normal with possible HK of inferolateral wall. We discussed options will proceed with cardiac MRI to clearly evaluate. Continue losartan and carvedilol for now.   2. Chemo-induced CM  As above  3. HTN - SBP ok. Diastolic a little elevated. Continue current therapy.  Batina Dougan,MD 10:53 AM

## 2016-09-19 NOTE — Addendum Note (Signed)
Encounter addended by: Effie Berkshire, RN on: 09/19/2016 11:12 AM<BR>    Actions taken: Order list changed, Diagnosis association updated, Sign clinical note

## 2016-09-19 NOTE — Addendum Note (Signed)
Encounter addended by: Effie Berkshire, RN on: 09/19/2016 11:09 AM<BR>    Actions taken: Order list changed, Diagnosis association updated, Sign clinical note

## 2016-09-19 NOTE — Patient Instructions (Addendum)
Will schedule you for Cardiac MRI at [facility]. Radiology department will call you to schedule.  Follow up 3 months with Dr. Haroldine Laws.  Do the following things EVERYDAY: 1) Weigh yourself in the morning before breakfast. Write it down and keep it in a log. 2) Take your medicines as prescribed 3) Eat low salt foods-Limit salt (sodium) to 2000 mg per day.  4) Stay as active as you can everyday 5) Limit all fluids for the day to less than 2 liters

## 2016-09-24 ENCOUNTER — Telehealth: Payer: Self-pay | Admitting: Internal Medicine

## 2016-09-24 ENCOUNTER — Telehealth (HOSPITAL_COMMUNITY): Payer: Self-pay | Admitting: *Deleted

## 2016-09-24 NOTE — Telephone Encounter (Signed)
Called the patient this am to find out when she wanted her cardiac MRI performed.  She stated any date or time as she will rearrange her schedule if need be.  She states she is not claustrophobic unless she "has a sweater over her face".

## 2016-09-24 NOTE — Telephone Encounter (Signed)
Message sent to [facility] to schedule [facility] patient has medicaid so no pre cert reqd.

## 2016-09-25 ENCOUNTER — Encounter: Payer: Self-pay | Admitting: Internal Medicine

## 2016-10-01 MED FILL — LOSARTAN POTASSIUM 25 MG TA: 25 | 30 days supply | Qty: 30 | Fill #2

## 2016-10-11 ENCOUNTER — Encounter: Payer: Self-pay | Admitting: Adult Health

## 2016-10-11 ENCOUNTER — Ambulatory Visit (HOSPITAL_BASED_OUTPATIENT_CLINIC_OR_DEPARTMENT_OTHER): Payer: Medicaid Other | Admitting: Adult Health

## 2016-10-11 ENCOUNTER — Ambulatory Visit (HOSPITAL_COMMUNITY)
Admission: RE | Admit: 2016-10-11 | Discharge: 2016-10-11 | Disposition: A | Discharge status: Home / Self Care | Payer: Medicaid Other | Source: Ambulatory Visit | Attending: Internal Medicine | Admitting: Internal Medicine

## 2016-10-11 VITALS — BP 115/56 | HR 111 | Temp 98.9°F | Resp 18 | Ht 67.0 in | Wt 246.6 lb

## 2016-10-11 DIAGNOSIS — I429 Cardiomyopathy, unspecified: Secondary | ICD-10-CM

## 2016-10-11 DIAGNOSIS — T451X5A Adverse effect of antineoplastic and immunosuppressive drugs, initial encounter: Secondary | ICD-10-CM | POA: Diagnosis not present

## 2016-10-11 DIAGNOSIS — R000 Tachycardia, unspecified: Secondary | ICD-10-CM

## 2016-10-11 DIAGNOSIS — Z171 Estrogen receptor negative status [ER-]: Secondary | ICD-10-CM

## 2016-10-11 DIAGNOSIS — I340 Nonrheumatic mitral (valve) insufficiency: Secondary | ICD-10-CM | POA: Diagnosis not present

## 2016-10-11 DIAGNOSIS — I517 Cardiomegaly: Secondary | ICD-10-CM | POA: Diagnosis not present

## 2016-10-11 DIAGNOSIS — C50511 Malignant neoplasm of lower-outer quadrant of right female breast: Secondary | ICD-10-CM

## 2016-10-11 DIAGNOSIS — I427 Cardiomyopathy due to drug and external agent: Secondary | ICD-10-CM | POA: Diagnosis present

## 2016-10-11 LAB — CREATININE, SERUM
CREATININE: 0.81 mg/dL (ref 0.44–1.00)
GFR calc Af Amer: >60 mL/min (ref >60)

## 2016-10-11 MED ORDER — GADOBENATE DIMEGLUMINE 529 MG/ML IV SOLN
35.0000 mL | Freq: Once | INTRAVENOUS | Status: AC | PRN
  Administered 2016-10-11: 35 mL via INTRAVENOUS

## 2016-10-11 NOTE — Progress Notes (Signed)
CLINIC:  Survivorship   REASON FOR VISIT:  Routine follow-up post-treatment for a recent history of breast cancer.  BRIEF ONCOLOGIC HISTORY:    Breast cancer of lower-outer quadrant of right female breast ([place])   06/05/2015 Mammogram    Diagnostic mammo and ultrasound: 2.5cm oval mass in right breast, ultrasound measured at 2.1cm, in left breast demonstrated area of focal asymmetry which was consistent with fibroglandular tissue.      06/15/2015 Initial Biopsy    Right breast core biopsy: IDC, ER-(0%), PR-(0%), Ki-67 40%, HER-2 positive (ratio 5.27).        07/11/2015 - 10/03/2015 Neo-Adjuvant Chemotherapy    Patient received 5 cycles of Carboplatin, Docetaxel, Trastuzumab, and Pertuzumab (stopped 1 cycle early due to patient intolerance)      08/16/2015 Genetic Testing    Genetic testing did not reveal a deleterious mutation.  Genes tested include: ATM, BARD1, BRCA1, BRCA2, BRIP1, CDH1, CHEK2, EPCAM, FANCC, MLH1, MSH2, MSH6, NBN, PALB2, PMS2, PTEN, RAD51C, RAD51D, TP53, and XRCC2.        11/09/2015 Surgery    Right breast lumpectomy [name]): no residual tumor identified, fibrosis and mild chronic inflammation, biopsy site reactions, margins clear.  3 SLN negative for disease.        11/14/2015 - 06/12/2016 Adjuvant Chemotherapy    Herceptin therapy given every 3 weeks       12/18/2015 - 02/01/2016 Radiation Therapy    Right breast radiation Isidore Moos), 1) Right Breast: 45 Gy in 25 fractions, 2) Right Breast Boost: 16 Gy in 8 fractions                         INTERVAL HISTORY:  Ms. Milius presents to the [facility] today for our initial meeting to review her survivorship care plan detailing her treatment course for breast cancer, as well as monitoring long-term side effects of that treatment, education regarding health maintenance, screening, and overall wellness and health promotion.     Overall, Ms. Lehane reports feeling well.  She is working as a Advertising copywriter, Camera operator.  She enjoys her 6 dogs at home.  She is doing well since finishing treatment.  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  She tells me that she notes some shortness of breath when working hard with her pets, but otherwise is doing well and without questions or concerns.  She does follow with Dr. Haroldine Laws in the cardio oncology clinic.  She does have some financial stressors, but otherwise reports being in good spirits.    REVIEW OF SYSTEMS:  Review of Systems  Constitutional: Negative for chills, fever and weight loss.  HENT: Negative for tinnitus.   Eyes: Negative for blurred vision and double vision.  Respiratory: Negative for cough and shortness of breath.   Cardiovascular: Negative for chest pain, palpitations and claudication.  Gastrointestinal: Negative for abdominal pain, blood in stool, constipation, diarrhea, heartburn, melena, nausea and vomiting.  Genitourinary: Negative for dysuria.  Musculoskeletal: Negative for back pain and myalgias.  Skin: Negative for rash.  Neurological: Negative for dizziness, tingling and headaches.  Endo/Heme/Allergies: Negative for environmental allergies. Does not bruise/bleed easily.  Psychiatric/Behavioral: Negative for depression. The patient is not nervous/anxious.      Breast: Denies any new nodularity, masses, tenderness, nipple changes, or nipple discharge.    A 14-point review of systems was completed and was negative, except as noted above.   ONCOLOGY TREATMENT TEAM:  1. Surgeon:  Dr. [name] at [facility] 2. Medical Oncologist: Dr. Jana Hakim 3. Radiation Oncologist: Dr. Isidore Moos    PAST MEDICAL/SURGICAL HISTORY:  Past Medical History:  Diagnosis Date  . Arthritis   . Breast cancer ([place])   . Breast cancer of lower-outer quadrant of right female breast ([place]) 06/20/2015  . Family history of stomach cancer   . GERD (gastroesophageal reflux disease)     . Hx of radiation therapy 12/18/15- 02/01/16   Right Breast   Past Surgical History:  Procedure Laterality Date  . BREAST LUMPECTOMY WITH RADIOACTIVE SEED AND SENTINEL LYMPH NODE BIOPSY Right 11/09/2015   Procedure: RIGHT BREAST LUMPECTOMY WITH RADIOACTIVE SEED AND SENTINEL LYMPH NODE BIOPSY AND EXCISION OF RIGHT BREAST FIBROADENOMA;  Surgeon: Autumn Messing III, MD;  Location: [place] [facility];  Service: General;  Laterality: Right;  . PORT-A-CATH REMOVAL Left 08/26/2016   Procedure: REMOVAL PORT-A-CATH;  Surgeon: Autumn Messing III, MD;  Location: [facility];  Service: General;  Laterality: Left;  . PORTACATH PLACEMENT Left 07/06/2015   Procedure: INSERTION PORT-A-CATH;  Surgeon: Autumn Messing III, MD;  Location: [place];  Service: General;  Laterality: Left;  . WISDOM TOOTH EXTRACTION       ALLERGIES:  Allergies  Allergen Reactions  . Cheese   . Dairy Aid [Lactase]     "sinus infections"  . Whey     Sinus issues  . Wine [Alcohol]     Severe sinus issues due to fermentation     CURRENT MEDICATIONS:  Outpatient Encounter Prescriptions as of 10/11/2016  Medication Sig  . acetaminophen (TYLENOL) 650 MG CR tablet Take 650 mg by mouth every 8 (eight) hours as needed for pain.  . Biotin 10000 MCG TABS Take 20,000 mcg by mouth.  . carvedilol (COREG) 3.125 MG tablet Take 1 tablet (3.125 mg total) by mouth 2 (two) times daily with a meal.  . losartan (COZAAR) 25 MG tablet Take 1 tablet (25 mg total) by mouth at bedtime.  . meclizine (ANTIVERT) 25 MG tablet Take 25 mg by mouth as needed for dizziness. Reported on 01/31/2016  . [DISCONTINUED] HYDROcodone-acetaminophen (NORCO/VICODIN) 5-325 MG tablet Take 1-2 tablets by mouth every 4 (four) hours as needed for moderate pain or severe pain.  . [DISCONTINUED] lidocaine-prilocaine (EMLA) cream Apply to affected area once   No facility-administered encounter medications on file as of 10/11/2016.      ONCOLOGIC FAMILY HISTORY:  Family  History  Problem Relation Age of Onset  . Diabetes Mother   . Hypertension Mother   . Hyperlipidemia Mother   . Stomach cancer Maternal Grandfather 49  . Diabetes Paternal Grandmother   . Dementia Maternal Uncle   . Congestive Heart Failure Maternal Grandmother      GENETIC COUNSELING/TESTING: Genetic testing did not reveal a deleterious mutation.  Genes tested include: ATM, BARD1, BRCA1, BRCA2, BRIP1, CDH1, CHEK2, EPCAM, FANCC, MLH1, MSH2, MSH6, NBN, PALB2, PMS2, PTEN, RAD51C, RAD51D, TP53, and XRCC2.    SOCIAL HISTORY:  Luan Urbani is single and lives alone with her six dogs in [place], [place].  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  She denies any current or history of tobacco, alcohol, or illicit drug use.     PHYSICAL EXAMINATION:  Vital Signs:   Vitals:   10/11/16 1026  BP: (!) 115/56  Pulse: (!) 111  Resp: 18  Temp: 98.9 F (37.2 C)   Filed Weights   10/11/16 1026  Weight: 246 lb 9.6 oz (  111.9 kg)   General: Well-nourished, well-appearing female in no acute distress.  She is unaccompanied today.   HEENT: Head is normocephalic.  Pupils equal and reactive to light. Conjunctivae clear without exudate.  Sclerae anicteric. Oral mucosa is pink, moist.  Oropharynx is pink without lesions or erythema.  Lymph: No cervical, supraclavicular, or infraclavicular lymphadenopathy noted on palpation.  Cardiovascular: Regular rate and rhythm.Marland Kitchen Respiratory: Clear to auscultation bilaterally. Chest expansion symmetric; breathing non-labored.  GI: Abdomen soft and round; non-tender, non-distended. Bowel sounds normoactive.  GU: Deferred.  Neuro: No focal deficits. Steady gait.  Psych: Mood and affect normal and appropriate for situation.  Extremities: No edema. Skin: Warm and dry.  LABORATORY DATA:  None for this visit.  DIAGNOSTIC IMAGING:  None for this visit.      ASSESSMENT AND PLAN:  Ms.. Lippman is a pleasant 46 y.o. female with Stage  IA right breast invasive ductal carcinoma, ER-/PR-/HER2+, diagnosed in 2016, treated with neoadjuvant chemotherapy, lumpectomy, adjuvant Trastuzumab, and right breast radiation.  She presents to the Survivorship Clinic for our initial meeting and routine follow-up post-completion of treatment for breast cancer.    1. Stage IA right breast cancer:  Ms. Boutelle is continuing to recover from definitive treatment for breast cancer. She will follow-up with her medical oncologist, Dr. Jana Hakim in May, 2018 with history and physical exam per surveillance protocol. Today, a comprehensive survivorship care plan and treatment summary was reviewed with the patient today detailing her breast cancer diagnosis, treatment course, potential late/long-term effects of treatment, appropriate follow-up care with recommendations for the future, and patient education resources.  A copy of this summary, along with a letter will be sent to the patient's primary care provider via mail/fax/In Basket message after today's visit.    2. Chemo-induced cardiomyopathy: She is following with Dr. Haroldine Laws and her cardiac function is improving.  She will continue to see him.  Of note, her HR is slightly elevated today.     3. Cancer screening:  Due to Ms. Neeb's history and her age, she should receive screening for skin cancers, colon cancer, and gynecologic cancers.  The information and recommendations are listed on the patient's comprehensive care plan/treatment summary and were reviewed in detail with the patient.    4. Health maintenance and wellness promotion: Ms. Brawley was encouraged to consume 5-7 servings of fruits and vegetables per day. We reviewed the "Nutrition Rainbow" handout, as well as the handout "Take Control of Your Health and Reduce Your Cancer Risk" from the [place].  She was also encouraged to engage in moderate to vigorous exercise for 30 minutes per day most days of the week. We discussed the  [employer] fitness program, which is designed for cancer survivors to help them become more physically fit after cancer treatments.  She was instructed to limit her alcohol consumption and continue to abstain from tobacco use.     5. Support services/counseling: It is not uncommon for this period of the patient's cancer care trajectory to be one of many emotions and stressors.  We discussed an opportunity for her to participate in the next session of [facility] ("Finding Your New Normal") support group series designed for patients after they have completed treatment.   Ms. Offutt was encouraged to take advantage of our many other support services programs, support groups, and/or counseling in coping with her new life as a cancer survivor after completing anti-cancer treatment.  She was offered support today through active listening and expressive supportive counseling.  She was given information regarding our available services and encouraged to contact me with any questions or for help enrolling in any of our support group/programs.    Dispo:   -Return to cancer center for labs and follow up with Dr. Jana Hakim as scheduled in May, 2018 -Continue follow up with Dr. Haroldine Laws regarding decreased EF.   -She is welcome to return back to the Survivorship Clinic at any time; no additional follow-up needed at this time.  -Consider referral back to survivorship as a long-term survivor for continued surveillance  A total of (30) minutes of face-to-face time was spent with this patient with greater than 50% of that time in counseling and care-coordination.   Gardenia Phlegm, NP Survivorship Program [place] [phone]   Note: PRIMARY CARE PROVIDER No PCP Per Patient None None

## 2016-10-18 ENCOUNTER — Telehealth (HOSPITAL_COMMUNITY): Payer: Self-pay

## 2016-10-18 NOTE — Telephone Encounter (Signed)
Patient calling to go over cardiac MRI results and to discuss if able to come off any medications. Will forward to Dr. Haroldine Laws to advise.  Renee Pain, RN

## 2016-10-18 NOTE — Telephone Encounter (Signed)
Cardiac MRI with EF 50% (low normal). No evidence scar or previous MI. Would continue losartan and carvedilol for now.

## 2016-10-21 ENCOUNTER — Other Ambulatory Visit (HOSPITAL_COMMUNITY): Payer: Self-pay

## 2016-10-21 MED ORDER — LOSARTAN POTASSIUM 25 MG PO TABS: 25.0000 mg | ORAL_TABLET | Freq: Every day | ORAL | 3 refills | Status: DC

## 2016-10-21 MED ORDER — CARVEDILOL 3.125 MG PO TABS: 3.1250 mg | ORAL_TABLET | Freq: Two times a day (BID) | ORAL | 3 refills | Status: DC

## 2016-10-21 NOTE — Telephone Encounter (Signed)
Cardiac MRI results reviewed with patient, refills sent in for medications to last her until may with additional refills if needed.  Renee Pain, RN

## 2016-10-22 NOTE — Telephone Encounter (Signed)
Patient called back today questioning how we would monitor her heart condition while not ordering any further test.  I tried to explain to her that at this time Dr. Haroldine Laws only wants her to continue her medications as discussed previously and to come back for her Follow Up appointment in May.  She continued to express how she doesn't understand how he would monitor her heart without ordering more test at this time.  I tried to explain again that at her follow up appointment there are a number of things that Dr. Haroldine Laws could order to see if further test are needed at that time but for now he only wants her to continue her medications.

## 2016-11-04 ENCOUNTER — Telehealth (HOSPITAL_COMMUNITY): Payer: Self-pay | Admitting: Pharmacist

## 2016-11-04 NOTE — Telephone Encounter (Signed)
Losartan PA approved by [place] [employer] through 10/20/17.   Ruta Hinds. Velva Harman, PharmD, BCPS, CPP Clinical Pharmacist Pager: [phone] Phone: [phone] 11/04/2016 11:52 AM

## 2016-11-12 MED FILL — LOSARTAN POTASSIUM 25 MG TA: 25 | 30 days supply | Qty: 30 | Fill #3

## 2016-11-12 MED FILL — CARVEDILOL 3.125 MG TABLET: 3.125 | 30 days supply | Qty: 60 | Fill #3

## 2016-12-18 ENCOUNTER — Other Ambulatory Visit: Payer: Medicaid Other

## 2016-12-18 ENCOUNTER — Other Ambulatory Visit (HOSPITAL_BASED_OUTPATIENT_CLINIC_OR_DEPARTMENT_OTHER): Payer: Medicaid Other

## 2016-12-18 ENCOUNTER — Ambulatory Visit (HOSPITAL_COMMUNITY)
Admission: RE | Admit: 2016-12-18 | Discharge: 2016-12-18 | Disposition: A | Discharge status: Home / Self Care | Payer: Medicaid Other | Source: Ambulatory Visit | Attending: Internal Medicine | Admitting: Internal Medicine

## 2016-12-18 VITALS — BP 122/80 | HR 72 | Ht 67.0 in | Wt 236.5 lb

## 2016-12-18 DIAGNOSIS — I10 Essential (primary) hypertension: Secondary | ICD-10-CM | POA: Insufficient documentation

## 2016-12-18 DIAGNOSIS — C50812 Malignant neoplasm of overlapping sites of left female breast: Secondary | ICD-10-CM | POA: Diagnosis present

## 2016-12-18 DIAGNOSIS — R002 Palpitations: Secondary | ICD-10-CM | POA: Diagnosis not present

## 2016-12-18 DIAGNOSIS — I427 Cardiomyopathy due to drug and external agent: Secondary | ICD-10-CM

## 2016-12-18 DIAGNOSIS — C50511 Malignant neoplasm of lower-outer quadrant of right female breast: Secondary | ICD-10-CM

## 2016-12-18 DIAGNOSIS — T451X5A Adverse effect of antineoplastic and immunosuppressive drugs, initial encounter: Secondary | ICD-10-CM | POA: Diagnosis not present

## 2016-12-18 DIAGNOSIS — Z923 Personal history of irradiation: Secondary | ICD-10-CM | POA: Diagnosis not present

## 2016-12-18 DIAGNOSIS — K219 Gastro-esophageal reflux disease without esophagitis: Secondary | ICD-10-CM | POA: Insufficient documentation

## 2016-12-18 DIAGNOSIS — Z171 Estrogen receptor negative status [ER-]: Secondary | ICD-10-CM | POA: Diagnosis not present

## 2016-12-18 DIAGNOSIS — Z87891 Personal history of nicotine dependence: Secondary | ICD-10-CM | POA: Insufficient documentation

## 2016-12-18 LAB — COMPREHENSIVE METABOLIC PANEL
ALBUMIN: 3.8 g/dL (ref 3.5–5.0)
ALK PHOS: 69 U/L (ref 40–150)
ALT: 17 U/L (ref 0–55)
AST: 14 U/L (ref 5–34)
Anion Gap: 7 mEq/L (ref 3–11)
BILIRUBIN TOTAL: 0.35 mg/dL (ref 0.20–1.20)
BUN: 8.5 mg/dL (ref 7.0–26.0)
CALCIUM: 9.4 mg/dL (ref 8.4–10.4)
CO2: 25 mEq/L (ref 22–29)
Chloride: 105 mEq/L (ref 98–109)
Creatinine: 0.8 mg/dL (ref 0.6–1.1)
EGFR: 86 mL/min/{1.73_m2} — ABNORMAL LOW (ref >90)
Glucose: 126 mg/dl (ref 70–140)
POTASSIUM: 4 meq/L (ref 3.5–5.1)
Sodium: 138 mEq/L (ref 136–145)
TOTAL PROTEIN: 7.1 g/dL (ref 6.4–8.3)

## 2016-12-18 LAB — CBC WITH DIFFERENTIAL/PLATELET
BASO%: 0.5 % (ref 0.0–2.0)
BASOS ABS: 0 10*3/uL (ref 0.0–0.1)
EOS ABS: 0.2 10*3/uL (ref 0.0–0.5)
EOS%: 2.4 % (ref 0.0–7.0)
HEMATOCRIT: 40.8 % (ref 34.8–46.6)
HEMOGLOBIN: 14.1 g/dL (ref 11.6–15.9)
LYMPH#: 1.9 10*3/uL (ref 0.9–3.3)
LYMPH%: 25 % (ref 14.0–49.7)
MCH: 30.4 pg (ref 25.1–34.0)
MCHC: 34.6 g/dL (ref 31.5–36.0)
MCV: 88 fL (ref 79.5–101.0)
MONO#: 0.4 10*3/uL (ref 0.1–0.9)
MONO%: 4.7 % (ref 0.0–14.0)
NEUT%: 67.4 % (ref 38.4–76.8)
NEUTROS ABS: 5.1 10*3/uL (ref 1.5–6.5)
Platelets: 285 10*3/uL (ref 145–400)
RBC: 4.64 10*6/uL (ref 3.70–5.45)
RDW: 12.4 % (ref 11.2–14.5)
WBC: 7.5 10*3/uL (ref 3.9–10.3)

## 2016-12-18 MED ORDER — CARVEDILOL 3.125 MG PO TABS: 3.1250 mg | ORAL_TABLET | Freq: Two times a day (BID) | ORAL | 6 refills | Status: DC

## 2016-12-18 MED ORDER — LOSARTAN POTASSIUM 25 MG PO TABS: 25.0000 mg | ORAL_TABLET | Freq: Every day | ORAL | 6 refills | Status: DC

## 2016-12-18 MED FILL — CARVEDILOL 3.125 MG TABLET: 3.125 | 30 days supply | Qty: 60 | Fill #0

## 2016-12-18 MED FILL — LOSARTAN POTASSIUM 25 MG TA: 25 | 30 days supply | Qty: 30 | Fill #0

## 2016-12-18 NOTE — Addendum Note (Signed)
Encounter addended by: Scarlette Calico, RN on: 12/18/2016 10:57 AM<BR>    Actions taken: Diagnosis association updated, Sign clinical note, Order list changed

## 2016-12-18 NOTE — Patient Instructions (Signed)
Your physician has recommended that you wear an event monitor. Event monitors are medical devices that record the heart's electrical activity. Doctors most often [place] these monitors to diagnose arrhythmias. Arrhythmias are problems with the speed or rhythm of the heartbeat. The monitor is a small, portable device. You can wear one while you do your normal daily activities. This is usually used to diagnose what is causing palpitations/syncope (passing out).  Your physician recommends that you schedule a follow-up appointment in: 2 months with echocardiogram

## 2016-12-18 NOTE — Progress Notes (Signed)
CARDIO-ONCOLOGY CLINIC CONSULT NOTE  Referring Physician: Magrinat   HPI:  Phyllis Wiggins is a 46 y.o. [place] woman with breast cancer who is referred to cardio-oncology clinic by Dr. Jana Hakim.  She is status post left breast biopsy 06/15/2015 for a clinical T1 cN0, stage IA invasive ductal carcinoma, grade 3, estrogen and progesterone receptor negative, HER-2 amplified, with an MIB-1 of 40%  (1) neoadjuvant chemotherapy to consist of carboplatin, docetaxel, pertuzumab, and trastuzumab with onpro support every 3 weeks  5 started 07/11/2015, last dose T/P 10/25/2015  (a) carboplatin and docetaxel stopped after 5 cycles due to patient intolerance  (2) Status post right lumpectomy and sentinel lymph node sampling for 01/23/2016 showing a complete pathologic response in the breast and 3 sentinel lymph nodes sampled, ypT0 ypN0  (3) adjuvant radiation to follow   (4) trastuzumab started 11/14/2015, to be continued to mid December 2017  (5) genetics testing 08/16/2015 through the Breast/Ovarian gene panel offered by [facility] i found no deleterious mutations  Previously EF was felt to be a little down. Started carvedilol and losartan.  Herceptin was stopped 11/17 (only had 2 doses left).   Here for f/u. Feels fine. cMRI 10/11/16 EF 50% ? Borderline noncompaction. 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 Walks 1-4 miles per day. No problem with that. No edema, orthopnea or PND. Has had time when she feels her HR slows down and she get mildly SOB. Lasts 2-7 minutes and then resolves. Happens about 2x/week. No dizziness.    Echo 12/16: EF 55-60% RV normal  Lat s' 10.6 GLS -19.8 Echo 6/17: EF 50-55% RV normal Lat s' 10.95 GLS - 21.4 (no 4 chamber) Echo 8/17: EF 55-60% RV normal  Lat s' 12.0 GLS -18.9% Echo 11/17: EF 45-50% RV normal. Lat s' 9.7 GLS -14.4% Echo 07/18/16 Ef 45-50% LS' 11.7 GLS -20.2% ECHO 09/19/16 EF 50-55%  GLS -18.4%   Past Medical History:  Diagnosis Date  . Arthritis   .  Breast cancer ([place])   . Breast cancer of lower-outer quadrant of right female breast ([place]) 06/20/2015  . Family history of stomach cancer   . GERD (gastroesophageal reflux disease)   . Hx of radiation therapy 12/18/15- 02/01/16   Right Breast    Current Outpatient Prescriptions  Medication Sig Dispense Refill  . acetaminophen (TYLENOL) 650 MG CR tablet Take 650 mg by mouth every 8 (eight) hours as needed for pain.    . Biotin 10000 MCG TABS Take 20,000 mcg by mouth.    . carvedilol (COREG) 3.125 MG tablet Take 1 tablet (3.125 mg total) by mouth 2 (two) times daily with a meal. 60 tablet 3  . losartan (COZAAR) 25 MG tablet Take 1 tablet (25 mg total) by mouth at bedtime. 30 tablet 3  . meclizine (ANTIVERT) 25 MG tablet Take 25 mg by mouth as needed for dizziness. Reported on 01/31/2016     No current facility-administered medications for this encounter.     Allergies  Allergen Reactions  . Cheese   . Dairy Aid [Lactase]     "sinus infections"  . Whey     Sinus issues  . Wine [Alcohol]     Severe sinus issues due to fermentation      Social History   Social History  . Marital status: Divorced    Spouse name: N/A  . Number of children: 0  . Years of education: N/A   Occupational History  . Not on file.   Social History Main Topics  .  Smoking status: Former Smoker    Types: Cigarettes    Quit date: 07/04/2010  . Smokeless tobacco: Never Used  . Alcohol use No  . Drug use: No  . Sexual activity: Not Currently    Birth control/ protection: None   Other Topics Concern  . Not on file   Social History Narrative  . No narrative on file      Family History  Problem Relation Age of Onset  . Diabetes Mother   . Hypertension Mother   . Hyperlipidemia Mother   . Stomach cancer Maternal Grandfather 30  . Diabetes Paternal Grandmother   . Dementia Maternal Uncle   . Congestive Heart Failure Maternal Grandmother     Vitals:   12/18/16 1007  BP: 122/80  Pulse: 72    SpO2: 96%  Weight: 236 lb 8 oz (107.3 kg)  Height: _0  (1.702 m)    PHYSICAL EXAM: General:  Well appearing. No resp difficulty HEENT: normal Neck: supple. no JVD. Carotids 2+ bilat; no bruits. No lymphadenopathy or thryomegaly appreciated. Cor: PMI nondisplaced. Regular rate & rhythm. No rubs, gallops or murmurs. Lungs: clear Abdomen: obese. soft, nontender, nondistended. No hepatosplenomegaly. No bruits or masses. Good bowel sounds. Extremities: no cyanosis, clubbing, rash, edema Neuro: alert & orientedx3, cranial nerves grossly intact. moves all 4 extremities w/o difficulty. Affect pleasant    ASSESSMENT & PLAN:  1. Breast CA (ER/PR -/HER 2-neu +) --she has completed therapy in 11/17  2. Chemo-induced CM  --cMRI reviewed with her EF 50%. (improved). I have reviewed MRI and doubt noncompaction. Continue losartan and carvedilol --will repeat echo in 2 months  3. HTN - Blood pressure well controlled. Continue current regimen.  4. Palpitations --Will place 30-day event monitor to further evaluate.    Darrick Greenlaw,MD 10:35 AM

## 2016-12-25 ENCOUNTER — Other Ambulatory Visit: Payer: Medicaid Other

## 2016-12-25 ENCOUNTER — Ambulatory Visit (HOSPITAL_BASED_OUTPATIENT_CLINIC_OR_DEPARTMENT_OTHER): Payer: Medicaid Other | Admitting: Oncology

## 2016-12-25 VITALS — BP 136/78 | HR 91 | Temp 97.7°F | Resp 18 | Ht 67.0 in | Wt 239.3 lb

## 2016-12-25 DIAGNOSIS — C50512 Malignant neoplasm of lower-outer quadrant of left female breast: Secondary | ICD-10-CM

## 2016-12-25 DIAGNOSIS — Z171 Estrogen receptor negative status [ER-]: Secondary | ICD-10-CM | POA: Diagnosis not present

## 2016-12-25 NOTE — Progress Notes (Signed)
[place]  [phone] [phone]   ID: Phyllis Wiggins DOB: [birth year]  MR#: [id number]  TMH#:[id number]  Patient Care Team: Patient, No Pcp Per as PCP - General (General Practice) Amarie Tarte, Virgie Dad, MD as Consulting Physician (Oncology) Jovita Kussmaul, MD as Consulting Physician (General Surgery) Delice Bison, Charlestine Massed, NP as Nurse Practitioner (Hematology and Oncology) Bensimhon, Shaune Pascal, MD as Consulting Physician (Cardiology) Eppie Gibson, MD as Attending Physician (Radiation Oncology) PCP: Patient, No Pcp Per GYN: OTHER MD:   CHIEF COMPLAINT: HER-2 positive, 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 She brought it to medical attention when she presented for the free Pap smear screening clinic here 06/01/2015. Exam on that day showed a lump in the right breast at the 4:00 position under the areola. There was also a scar on the right outer breast. This scar was noted in 01/04/2008 when she had her baseline mammogram at [facility]. It had been stable for a year at that time and the patient had treated it with a "blood root paste" which caused scarring. 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 It is in a separate location from the new mass.  On 06/05/2015 Phyllis Wiggins underwent bilateral diagnostic mammography at [facility], with bilateral ultrasonography. This found the breast density to be category B. There was a 2.5 cm oval mass in the right breast at the 5:00 position which by ultrasound measured 2.1 cm. In the left breast there was an area of focal asymmetry which by ultrasound was consistent with fibroglandular tissue/benign.  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  Her subsequent history is as detailed below.  INTERVAL HISTORY: Phyllis Wiggins returns today for follow-up of her estrogen receptor negative breast cancer. She completed her trastuzumab treatments last November, when she had a slight drop in her ejection fraction, which has since resolved.   She had repeat mammography in January which showed some calcifications, with benign biopsy ([id number] [phone]). She has a repeat due next month.  REVIEW OF SYSTEMS:  Phyllis Wiggins is working hard at her multiple jobs to try to pay off for multiple debts. Occasionally she has stabbing pains at the surgical site and sometimes at other sites. For example she had a feeling like for blades were cutting open her head from the back to the top, but that lasted only 2 or 3 seconds. She has a burning sensation sometimes or a ripping sensation associated with her prior surgeries. Sometimes she has a sensation of pressure over her eyes. She is having "loud heartbeats" associated with some shortness of breath and these occur and very irregular intervals, possibly at rest, possibly with activity. She feels forgetful but she says that much better. A detailed review of systems today was otherwise stable  PAST MEDICAL HISTORY: Past Medical History:  Diagnosis Date  . Arthritis   . Breast cancer ([place])   . Breast cancer of lower-outer quadrant of right female breast ([place]) 06/20/2015  . Family history of stomach cancer   . GERD (gastroesophageal reflux disease)   . Hx of radiation therapy 12/18/15- 02/01/16   Right Breast  PAST SURGICAL HISTORY: Past Surgical History:  Procedure Laterality Date  . BREAST LUMPECTOMY WITH RADIOACTIVE SEED AND SENTINEL LYMPH NODE BIOPSY Right 11/09/2015   Procedure: RIGHT BREAST LUMPECTOMY WITH RADIOACTIVE SEED AND SENTINEL LYMPH NODE BIOPSY AND EXCISION OF  RIGHT BREAST FIBROADENOMA;  Surgeon: Autumn Messing III, MD;  Location: [facility];  Service: General;  Laterality: Right;  . PORT-A-CATH REMOVAL Left 08/26/2016   Procedure: REMOVAL PORT-A-CATH;  Surgeon: Autumn Messing III, MD;  Location: [place];  Service: General;  Laterality: Left;  . PORTACATH PLACEMENT Left 07/06/2015   Procedure: INSERTION PORT-A-CATH;  Surgeon: Autumn Messing III, MD;  Location: [place];  Service: General;  Laterality: Left;  . WISDOM TOOTH EXTRACTION      FAMILY HISTORY Family History  Problem Relation Age of Onset  . Diabetes Mother   . Hypertension Mother   . Hyperlipidemia Mother   . Stomach cancer Maternal Grandfather 37  . Diabetes Paternal Grandmother   . Dementia Maternal Uncle   . Congestive Heart Failure Maternal Grandmother    the patient's parents are both living, in their early 63s. The patient had no brother, one sister. The patient's mother works by [employer] for private clients. The patient's sister is a Pharmacist, hospital in [place] there is no history of breast or ovarian cancer in the family. The patient's paternal grandfather died at the age of 10 from stomach cancer felt to be related to iron unusual toxic exposure   GYNECOLOGIC HISTORY:  No LMP recorded.  menarche age 58, she is Phyllis Wiggins  she is still having periods but they're quite sporadic. They may occur 6 weeks apart or 3 weeks apart, may be heavy or only spotting. 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 She is divorced and at home lives with 6 dogs.     ADVANCED DIRECTIVES:  not in place    HEALTH MAINTENANCE: Social History  Substance Use Topics  . Smoking status: Former Smoker    Types: Cigarettes    Quit date: 07/04/2010  . Smokeless tobacco:  Never Used  . Alcohol use No     Colonoscopy:  PAP: October 2016   Bone density:  Lipid panel:  Allergies  Allergen Reactions  . Cheese   . Dairy Aid [Lactase]     "sinus infections"  . Whey     Sinus issues  . 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 acetaminophen (TYLENOL) 650 MG CR tablet Take 650 mg by mouth every 8 (eight) hours as needed for pain.    . Biotin 10000 MCG TABS Take 20,000 mcg by mouth.    . carvedilol (COREG) 3.125 MG tablet Take 1 tablet (3.125 mg total) by mouth 2 (two) times daily with a meal. 60 tablet 6  . losartan (COZAAR) 25 MG tablet Take 1 tablet (25 mg total) by mouth at bedtime. 30 tablet 6  . meclizine (ANTIVERT) 25 MG tablet Take 25 mg by mouth as needed for dizziness. Reported on 01/31/2016     No current facility-administered medications for this visit.     OBJECTIVE: Middle-aged white woman In no acute distress  Vitals:   12/25/16 1151  BP: 136/78  Pulse: 91  Resp: 18  Temp: 97.7 F (36.5 C)     Body mass index is 37.48 kg/m.    ECOG FS:1 - Symptomatic but completely ambulatory  Sclerae unicteric, pupils round and equal Oropharynx clear and moist No cervical or supraclavicular adenopathy Lungs no rales or rhonchi Heart regular rate and rhythm Abd soft, nontender, positive bowel sounds MSK no focal spinal tenderness, no upper extremity lymphedema Neuro: nonfocal, well oriented, appropriate affect Breasts: The right breast as undergone lumpectomy and radiation with no evidence of local recurrence. The left breast is benign. Both axillae are benign.   LAB RESULTS:  CMP     Component Value Date/Time   NA 138 12/18/2016 0847   K 4.0 12/18/2016 0847   CO2 25 12/18/2016 0847   GLUCOSE 126 12/18/2016 0847   BUN 8.5 12/18/2016 0847   CREATININE 0.8 12/18/2016 0847   CALCIUM 9.4 12/18/2016 0847   PROT 7.1 12/18/2016 0847   ALBUMIN 3.8  12/18/2016 0847   AST 14 12/18/2016 0847   ALT 17 12/18/2016 0847   ALKPHOS 69 12/18/2016 0847   BILITOT 0.35 12/18/2016 0847   GFRNONAA >60 10/11/2016 0735   GFRAA >60 10/11/2016 0735    INo results found for: SPEP, UPEP  Lab Results  Component Value Date   WBC 7.5 12/18/2016   NEUTROABS 5.1 12/18/2016   HGB 14.1 12/18/2016   HCT 40.8 12/18/2016   MCV 88.0 12/18/2016   PLT 285 12/18/2016      Chemistry      Component Value Date/Time   NA 138 12/18/2016 0847   K 4.0 12/18/2016 0847   CO2 25 12/18/2016 0847   BUN 8.5 12/18/2016 0847   CREATININE 0.8 12/18/2016 0847      Component Value Date/Time   CALCIUM 9.4 12/18/2016 0847   ALKPHOS 69 12/18/2016 0847   AST 14 12/18/2016 0847   ALT 17 12/18/2016 0847   BILITOT 0.35 12/18/2016 0847       No results found for: LABCA2  No components found for: LABCA125  No results for input(s): INR in the last 168 hours.  Urinalysis No results found for: COLORURINE, APPEARANCEUR, LABSPEC, PHURINE, GLUCOSEU, HGBUR, BILIRUBINUR, KETONESUR, PROTEINUR, UROBILINOGEN, NITRITE, LEUKOCYTESUR  STUDIES:   ASSESSMENT: 46 y.o. BRCA negative [place] woman status post left breast lower outer quadrant biopsy 06/15/2015 for a clinical T1 cN0, stage IA invasive ductal carcinoma, grade 3, estrogen and progesterone receptor negative, HER-2 amplified, with an MIB-1 of 40%  (1) neoadjuvant chemotherapy consisting of carboplatin, docetaxel, pertuzumab, and trastuzumab with onpro support every 3 weeks 6 started 07/11/2015, last dose T/P 10/25/2015  (a) carboplatin and docetaxel stopped after 5 cycles due to patient intolerance  (2) Status post right lumpectomy and sentinel lymph node sampling for 01/23/2016 showing a complete pathologic response in the breast and 3 sentinel lymph nodes sampled, ypT0 ypN0  (3) adjuvant radiation completed 01/31/2016  (4) trastuzumab started 11/14/2015, continued to 06/12/2017, stopped for a mild drop in her  ejection fraction  (a) echocardiogram 03/21/2016 shows an ejection fraction of 55-60%  (b) echocardiography 06/19/2016 showed an ejection fraction in the 45-50% range  (c)Echocardiography 09/19/2016 shows an ejection fraction in the 50-55% range  (5) genetics testing 08/16/2015 through the Breast/Ovarian gene panel offered by [facility] i found no deleterious mutations in ATM, BARD1, BRCA1, BRCA2, BRIP1, CDH1, CHEK2, EPCAM, FANCC, MLH1, MSH2, MSH6, NBN, PALB2, PMS2, PTEN, RAD51C, RAD51D, TP53, and XRCC2.   PLAN: Phyllis Wiggins is now just about a year out from definitive  surgery for her breast cancer with no evidence of disease recurrence. This is very favorable.  Her ejection fraction has normalized. It sounds to me like she is cut some skipped beats causing some over filling of the heart causing her sensation of "loud heartbeats".. This is going to be evaluated with a Holter in the next couple of weeks and she has excellent cardiology follow-up as per as that is concerned.  She has a complex work schedule, despite which I think it would be useful if she could exercise on a regular basis. That might also help normalize the heart issue.  At this point I feel comfortable seeing her on an every six-month basis. She will see me again in November. She knows to call for any problems that may develop before that visit.    Chauncey Cruel, MD   12/25/2016 12:02 PM

## 2016-12-31 ENCOUNTER — Ambulatory Visit (INDEPENDENT_AMBULATORY_CARE_PROVIDER_SITE_OTHER): Payer: Medicaid Other

## 2016-12-31 DIAGNOSIS — R002 Palpitations: Secondary | ICD-10-CM | POA: Diagnosis not present

## 2017-01-16 MED FILL — CARVEDILOL 3.125 MG TABLET: 3.125 | 30 days supply | Qty: 60 | Fill #1

## 2017-01-16 MED FILL — LOSARTAN POTASSIUM 25 MG TA: 25 | 30 days supply | Qty: 30 | Fill #1

## 2017-02-11 ENCOUNTER — Ambulatory Visit (HOSPITAL_BASED_OUTPATIENT_CLINIC_OR_DEPARTMENT_OTHER)
Admission: RE | Admit: 2017-02-11 | Discharge: 2017-02-11 | Disposition: A | Discharge status: Home / Self Care | Payer: Medicaid Other | Source: Ambulatory Visit | Attending: Internal Medicine | Admitting: Internal Medicine

## 2017-02-11 ENCOUNTER — Encounter (HOSPITAL_COMMUNITY): Payer: Self-pay | Admitting: Internal Medicine

## 2017-02-11 ENCOUNTER — Ambulatory Visit (HOSPITAL_COMMUNITY)
Admission: RE | Admit: 2017-02-11 | Discharge: 2017-02-11 | Disposition: A | Discharge status: Home / Self Care | Payer: Medicaid Other | Source: Ambulatory Visit | Attending: Internal Medicine | Admitting: Internal Medicine

## 2017-02-11 VITALS — BP 110/62 | HR 66 | Wt 237.0 lb

## 2017-02-11 DIAGNOSIS — T451X5A Adverse effect of antineoplastic and immunosuppressive drugs, initial encounter: Secondary | ICD-10-CM | POA: Diagnosis not present

## 2017-02-11 DIAGNOSIS — I10 Essential (primary) hypertension: Secondary | ICD-10-CM | POA: Diagnosis not present

## 2017-02-11 DIAGNOSIS — Z87891 Personal history of nicotine dependence: Secondary | ICD-10-CM | POA: Insufficient documentation

## 2017-02-11 DIAGNOSIS — I427 Cardiomyopathy due to drug and external agent: Secondary | ICD-10-CM

## 2017-02-11 DIAGNOSIS — C50511 Malignant neoplasm of lower-outer quadrant of right female breast: Secondary | ICD-10-CM | POA: Diagnosis present

## 2017-02-11 DIAGNOSIS — R002 Palpitations: Secondary | ICD-10-CM

## 2017-02-11 DIAGNOSIS — Z171 Estrogen receptor negative status [ER-]: Secondary | ICD-10-CM | POA: Diagnosis not present

## 2017-02-11 DIAGNOSIS — I340 Nonrheumatic mitral (valve) insufficiency: Secondary | ICD-10-CM | POA: Diagnosis not present

## 2017-02-11 DIAGNOSIS — C50512 Malignant neoplasm of lower-outer quadrant of left female breast: Secondary | ICD-10-CM | POA: Diagnosis not present

## 2017-02-11 NOTE — Progress Notes (Signed)
CARDIO-ONCOLOGY CLINIC CONSULT NOTE  Referring Physician: Magrinat   HPI:  Phyllis Wiggins is a 46 y.o. [place] woman with breast cancer who is referred to cardio-oncology clinic by Dr. Jana Hakim.  She is status post left breast biopsy 06/15/2015 for a clinical T1 cN0, stage IA invasive ductal carcinoma, grade 3, estrogen and progesterone receptor negative, HER-2 amplified, with an MIB-1 of 40%  (1) neoadjuvant chemotherapy to consist of carboplatin, docetaxel, pertuzumab, and trastuzumab with onpro support every 3 weeks  5 started 07/11/2015, last dose T/P 10/25/2015  (a) carboplatin and docetaxel stopped after 5 cycles due to patient intolerance  (2) Status post right lumpectomy and sentinel lymph node sampling for 01/23/2016 showing a complete pathologic response in the breast and 3 sentinel lymph nodes sampled, ypT0 ypN0  (3) adjuvant radiation to follow   (4) trastuzumab started 11/14/2015, to be continued to mid December 2017  (5) genetics testing 08/16/2015 through the Breast/Ovarian gene panel offered by [facility] i found no deleterious mutations  Previously EF was felt to be a little down. Started carvedilol and losartan.  Herceptin was stopped 11/17 (only had 2 doses left).   Presents today for HF follow up. Feeling great overall. Denies CP, SOB, lightheadedness or dizziness. Wants to be able to stop her meds.  Exercising daily. Using a phone to keep track of her steps. Has been taking all medication as directed. Denies peripheral edema or orthopnea.   Echo 12/16: EF 55-60% RV normal  Lat s' 10.6 GLS -19.8 Echo 6/17: EF 50-55% RV normal Lat s' 10.95 GLS - 21.4 (no 4 chamber) Echo 8/17: EF 55-60% RV normal  Lat s' 12.0 GLS -18.9% Echo 11/17: EF 45-50% RV normal. Lat s' 9.7 GLS -14.4% Echo 07/18/16 Ef 45-50% LS' 11.7 GLS -20.2% Echo 09/19/16 EF 50-55%  GLS -18.4% Echo 02/11/17 EF 55-60%, GLS -16.9% but underestimated.   Past Medical History:  Diagnosis Date  . Arthritis   .  Breast cancer ([place])   . Breast cancer of lower-outer quadrant of right female breast ([place]) 06/20/2015  . Family history of stomach cancer   . GERD (gastroesophageal reflux disease)   . Hx of radiation therapy 12/18/15- 02/01/16   Right Breast    Current Outpatient Prescriptions  Medication Sig Dispense Refill  . acetaminophen (TYLENOL) 650 MG CR tablet Take 650 mg by mouth every 8 (eight) hours as needed for pain.    . Biotin 10000 MCG TABS Take 20,000 mcg by mouth.    . carvedilol (COREG) 3.125 MG tablet Take 1 tablet (3.125 mg total) by mouth 2 (two) times daily with a meal. 60 tablet 6  . losartan (COZAAR) 25 MG tablet Take 1 tablet (25 mg total) by mouth at bedtime. 30 tablet 6  . meclizine (ANTIVERT) 25 MG tablet Take 25 mg by mouth as needed for dizziness. Reported on 01/31/2016     No current facility-administered medications for this encounter.     Allergies  Allergen Reactions  . Cheese   . Dairy Aid [Lactase]     "sinus infections"  . Whey     Sinus issues  . Wine [Alcohol]     Severe sinus issues due to fermentation      Social History   Social History  . Marital status: Divorced    Spouse name: N/A  . Number of children: 0  . Years of education: N/A   Occupational History  . Not on file.   Social History Main Topics  . Smoking status: Former Smoker  Types: Cigarettes    Quit date: 07/04/2010  . Smokeless tobacco: Never Used  . Alcohol use No  . Drug use: No  . Sexual activity: Not Currently    Birth control/ protection: None   Other Topics Concern  . Not on file   Social History Narrative  . No narrative on file      Family History  Problem Relation Age of Onset  . Diabetes Mother   . Hypertension Mother   . Hyperlipidemia Mother   . Stomach cancer Maternal Grandfather 56  . Diabetes Paternal Grandmother   . Dementia Maternal Uncle   . Congestive Heart Failure Maternal Grandmother     Vitals:   02/11/17 1050  BP: 110/62  Pulse: 66    SpO2: 100%  Weight: 237 lb (107.5 kg)   Wt Readings from Last 3 Encounters:  02/11/17 237 lb (107.5 kg)  12/25/16 239 lb 4.8 oz (108.5 kg)  12/18/16 236 lb 8 oz (107.3 kg)     PHYSICAL EXAM: General: Well appearing. No resp difficulty. HEENT: Normal Neck: Supple. JVP 5-6. Carotids 2+ bilat; no bruits. No thyromegaly or nodule noted. Cor: PMI nondisplaced. RRR, No M/G/R noted Lungs: CTAB, normal effort. Abdomen: Soft, non-tender, non-distended, no HSM. No bruits or masses. +BS  Extremities: No cyanosis, clubbing, rash, R and LLE no edema.  Neuro: Alert & orientedx3, cranial nerves grossly intact. moves all 4 extremities w/o difficulty. Affect pleasant    ASSESSMENT & PLAN:  1. Breast CA (ER/PR -/HER 2-neu +) -- Completed therapy in 11/17  2. Chemo-induced CM  -- cMRI 10/14/2016 with EF 50%. (Improved.) Dr. Haroldine Laws previously reviewed MRI and doubt noncompaction.  -- Continue losartan 25 mg qhs. Can stop if SBP remains below 140 off coreg -- STOP coreg 3.125 mg BID. -- Echo today shows EF 55-60%, GLS -16.9% but underestimated. EF normal.  3. HTN - Well controlled on current regimen. Meds as above.   4. Palpitations - 30-day event monitor completed.  - Personally reviewed. HR 70-100 bpm. No pauses. 7 self-triggered recordings which showed either NSR or Sinus Tach.   EF now normal. Stop coreg. Can stop losartan at end of week if SBP remains < 140.   Shirley Friar, PA-C  11:07 AM  Patient seen and examined with the above-signed Advanced Practice Provider and/or Housestaff. I personally reviewed laboratory data, imaging studies and relevant notes. I independently examined the patient and formulated the important aspects of the plan. I have edited the note to reflect any of my changes or salient points. I have personally discussed the plan with the patient and/or family.  Echo reviewed personally. EF has normalized. Now finished with Herceptin. BP looks good. Can  stop ARB and losartan. F/u as needed,.   Glori Bickers, MD  10:41 PM

## 2017-02-11 NOTE — Patient Instructions (Signed)
Stop Carvedilol  If SBP (top number of BP) is less than 140, can stop Losartan as well  Follow up as needed

## 2017-02-11 NOTE — Progress Notes (Signed)
  Echocardiogram 2D Echocardiogram has been performed.  Jennette Dubin 02/11/2017, 10:40 AM

## 2017-02-19 ENCOUNTER — Encounter (HOSPITAL_COMMUNITY): Payer: Self-pay | Admitting: *Deleted

## 2017-06-17 ENCOUNTER — Other Ambulatory Visit: Payer: Self-pay | Admitting: *Deleted

## 2017-06-17 DIAGNOSIS — C50512 Malignant neoplasm of lower-outer quadrant of left female breast: Secondary | ICD-10-CM

## 2017-06-17 DIAGNOSIS — Z171 Estrogen receptor negative status [ER-]: Principal | ICD-10-CM

## 2017-06-17 NOTE — Progress Notes (Signed)
[place]  [phone] [phone]   ID: Phyllis Wiggins DOB: 12/01/1970  MR#: [id number]  TKZ#:[id number]  Patient Care Team: Patient, No Pcp Per as PCP - General (General Practice) Tramayne Sebesta, Virgie Dad, MD as Consulting Physician (Oncology) Jovita Kussmaul, MD as Consulting Physician (General Surgery) Delice Bison, Charlestine Massed, NP as Nurse Practitioner (Hematology and Oncology) Bensimhon, Shaune Pascal, MD as Consulting Physician (Cardiology) Eppie Gibson, MD as Attending Physician (Radiation Oncology) PCP: Patient, No Pcp Per GYN: OTHER MD:   CHIEF COMPLAINT: HER-2 positive, 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 She brought it to medical attention when she presented for the free Pap smear screening clinic here 06/01/2015. Exam on that day showed a lump in the right breast at the 4:00 position under the areola. There was also a scar on the right outer breast. This scar was noted in 01/04/2008 when she had her baseline mammogram at [facility]. It had been stable for a year at that time and the patient had treated it with a "blood root paste" which caused scarring. 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 It is in a separate location from the new mass.  On 06/05/2015 [name] underwent bilateral diagnostic mammography at [facility], with bilateral ultrasonography. This found the breast density to be category B. There was a 2.5 cm oval mass in the right breast at the 5:00 position which by ultrasound measured 2.1 cm. In the left breast there was an area of focal asymmetry which by ultrasound was consistent with fibroglandular tissue/benign.  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  Her subsequent history is as detailed below.  INTERVAL HISTORY: [name] returns today for follow-up of her estrogen receptor negative breast cancer.  She continues under observation alone. She is doing well overall from a physical point of view.  Psychologically she is doing very poorly.  She is very depressed.  She is isolated.  She does not know who to turn to.  She is interested in individual or group help.   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 She notes some weeks she walks dogs 3-5 times a week, and other weeks she may not walk any dogs. For exercise, she also walks with her own dogs. 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 She reports that she has to physically lift the dogs, and does "yoga like" positions while grooming them, which she notes is straining at times. She does regular house work and [employer]. She notes that her blood pressure has been higher than what she normally experiences, but she has been taking HTN medication which helps. She denies unusual headaches, visual changes, nausea, vomiting, or dizziness. There has been no unusual cough, phlegm production, or pleurisy. This been no change in bowel or bladder habits. She denies unexplained fatigue or unexplained weight loss, bleeding, rash, or fever. A detailed review of systems was otherwise stable.    PAST MEDICAL HISTORY: Past Medical History:  Diagnosis Date  . Arthritis   . Breast cancer ([place])   . Breast cancer  of lower-outer quadrant of right female breast ([place]) 06/20/2015  . Family history of stomach cancer   . GERD (gastroesophageal reflux disease)   . Hx of radiation therapy 12/18/15- 02/01/16   Right Breast    PAST SURGICAL HISTORY: Past Surgical History:  Procedure Laterality Date  . WISDOM  TOOTH EXTRACTION      FAMILY HISTORY Family History  Problem Relation Age of Onset  . Diabetes Mother   . Hypertension Mother   . Hyperlipidemia Mother   . Stomach cancer Maternal Grandfather 85  . Diabetes Paternal Grandmother   . Dementia Maternal Uncle   . Congestive Heart Failure Maternal Grandmother    the patient's parents are both living, in their early 41s. The patient had no brother, one sister. The patient's mother works by [employer] for private clients. The patient's sister is a Pharmacist, hospital in [place] there is no history of breast or ovarian cancer in the family. The patient's paternal grandfather died at the age of 30 from stomach cancer felt to be related to iron unusual toxic exposure   GYNECOLOGIC HISTORY:  No LMP recorded.  menarche age 63, she is [place] P0  she is still having periods but they're quite sporadic. They may occur 6 weeks apart or 3 weeks apart, may be heavy or only spotting. She used oral contraceptives for more than 20 years with no complications   SOCIAL HISTORY:  [name] works multiple jobs as Haematologist, Designer, industrial/product, Radiation protection practitioner, and other part-time jobs. 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 She is divorced and at home lives with 6 dogs.     ADVANCED DIRECTIVES:  not in place    HEALTH MAINTENANCE: Social History   Tobacco Use  . Smoking status: Former Smoker    Types: Cigarettes    Last attempt to quit: 07/04/2010    Years since quitting: 6.9  . Smokeless tobacco: Never Used  Substance Use Topics  . Alcohol use: No  . Drug use: No     Colonoscopy:  PAP: October 2016   Bone density:  Lipid panel:  Allergies  Allergen Reactions  . Cheese   . Dairy Aid [Lactase]     "sinus infections"  . Whey     Sinus issues  . Wine [Alcohol]     Severe sinus issues due to fermentation    Current Outpatient Medications  Medication Sig Dispense Refill  . acetaminophen (TYLENOL) 650 MG CR tablet Take 650  mg by mouth every 8 (eight) hours as needed for pain.    . Biotin 10000 MCG TABS Take 20,000 mcg by mouth.    . losartan (COZAAR) 25 MG tablet Take 1 tablet (25 mg total) by mouth at bedtime. 30 tablet 6  . meclizine (ANTIVERT) 25 MG tablet Take 25 mg by mouth as needed for dizziness. Reported on 01/31/2016     No current facility-administered medications for this visit.     OBJECTIVE: Middle-aged white woman who appears stated age  46:   06/18/17 1120  BP: (!) 132/53  Pulse: 66  Resp: 20  Temp: 99.1 F (37.3 C)  SpO2: 98%     Body mass index is 37.14 kg/m.    ECOG FS:1 - Symptomatic but completely ambulatory  Sclerae unicteric, EOMs intact Oropharynx clear and moist No cervical or supraclavicular adenopathy Lungs no rales or rhonchi Heart regular rate and rhythm Abd soft, nontender, positive bowel sounds MSK no focal spinal tenderness, no upper extremity lymphedema Neuro: nonfocal,  well oriented, appropriate affect Breasts the right breast is status post lumpectomy and radiation.  There is no evidence of local recurrence.  The left breast is benign.  Both axillae are benign.Marland Kitchen   LAB RESULTS:  CMP     Component Value Date/Time   NA 140 06/18/2017 1054   K 4.5 06/18/2017 1054   CO2 26 06/18/2017 1054   GLUCOSE 141 (H) 06/18/2017 1054   BUN 10.8 06/18/2017 1054   CREATININE 0.8 06/18/2017 1054   CALCIUM 9.2 06/18/2017 1054   PROT 7.2 06/18/2017 1054   ALBUMIN 3.6 06/18/2017 1054   AST 11 06/18/2017 1054   ALT 12 06/18/2017 1054   ALKPHOS 55 06/18/2017 1054   BILITOT 0.34 06/18/2017 1054   GFRNONAA >60 10/11/2016 0735   GFRAA >60 10/11/2016 0735    INo results found for: SPEP, UPEP  Lab Results  Component Value Date   WBC 8.3 06/18/2017   NEUTROABS 5.4 06/18/2017   HGB 13.5 06/18/2017   HCT 40.9 06/18/2017   MCV 88.7 06/18/2017   PLT 288 06/18/2017      Chemistry      Component Value Date/Time   NA 140 06/18/2017 1054   K 4.5 06/18/2017 1054   CO2  26 06/18/2017 1054   BUN 10.8 06/18/2017 1054   CREATININE 0.8 06/18/2017 1054      Component Value Date/Time   CALCIUM 9.2 06/18/2017 1054   ALKPHOS 55 06/18/2017 1054   AST 11 06/18/2017 1054   ALT 12 06/18/2017 1054   BILITOT 0.34 06/18/2017 1054       No results found for: LABCA2  No components found for: LABCA125  No results for input(s): INR in the last 168 hours.  Urinalysis No results found for: COLORURINE, APPEARANCEUR, LABSPEC, PHURINE, GLUCOSEU, HGBUR, BILIRUBINUR, KETONESUR, PROTEINUR, UROBILINOGEN, NITRITE, LEUKOCYTESUR  STUDIES: Most recent mammography in July was unremarkable.  She is scheduled for her next bilateral mammography in April 2019  ASSESSMENT: 46 y.o. BRCA negative [place] woman status post left breast lower outer quadrant biopsy 06/15/2015 for a clinical T1 cN0, stage IA invasive ductal carcinoma, grade 3, estrogen and progesterone receptor negative, HER-2 amplified, with an MIB-1 of 40%  (1) neoadjuvant chemotherapy consisting of carboplatin, docetaxel, pertuzumab, and trastuzumab with onpro support every 3 weeks 6 started 07/11/2015, last dose T/P 10/25/2015  (a) carboplatin and docetaxel stopped after 5 cycles due to patient intolerance  (2) Status post right lumpectomy and sentinel lymph node sampling for 01/23/2016 showing a complete pathologic response in the breast and 3 sentinel lymph nodes sampled, ypT0 ypN0  (3) adjuvant radiation completed 01/31/2016  (4) trastuzumab started 11/14/2015, continued to 06/12/2016, stopped for a mild drop in her ejection fraction  (a) echocardiogram 03/21/2016 shows an ejection fraction of 55-60%  (b) echocardiography 06/19/2016 showed an ejection fraction in the 45-50% range  (c)Echocardiography 09/19/2016 shows an ejection fraction in the 50-55% range  (5) genetics testing 08/16/2015 through the Breast/Ovarian gene panel offered by [facility] i found no deleterious mutations in ATM, BARD1, BRCA1, BRCA2,  BRIP1, CDH1, CHEK2, EPCAM, FANCC, MLH1, MSH2, MSH6, NBN, PALB2, PMS2, PTEN, RAD51C, RAD51D, TP53, and XRCC2.   PLAN: Reyann is now a year and a half out from definitive surgery for her estrogen receptor negative breast cancer, with no evidence of disease recurrence.  This is very favorable.  She is very isolated and depressed.  She is extremely anxious because of money issues.  She is requesting some counseling.  I am going to see if our  chaplain and navigator can reach out to her.  In the meantime were going to start venlafaxine Exar at 75 mg daily.  She has a good understanding of the possible toxicity side effects and complications of this agent.  If she does not find this is very helpful after 3 or 4 weeks she will let me know and I will double the dose  Otherwise she will see me again in April of next year.  At that point I expect to start seeing her on a once a year basis.   Allora Bains, Virgie Dad, MD  06/18/17 11:48 AM Medical Oncology and Hematology [facility] [address] [place],  [address] Tel. [phone]    Fax. [phone]    This document serves as a record of services personally performed by Lurline Del, MD. It was created on his behalf by Sheron Nightingale, a trained medical scribe. The creation of this record is based on the scribe's personal observations and the provider's statements to them.   I have reviewed the above documentation for accuracy and completeness, and I agree with the above.

## 2017-06-18 ENCOUNTER — Other Ambulatory Visit (HOSPITAL_BASED_OUTPATIENT_CLINIC_OR_DEPARTMENT_OTHER): Payer: Medicaid Other

## 2017-06-18 ENCOUNTER — Ambulatory Visit (HOSPITAL_BASED_OUTPATIENT_CLINIC_OR_DEPARTMENT_OTHER): Payer: Medicaid Other | Admitting: Oncology

## 2017-06-18 ENCOUNTER — Telehealth: Payer: Self-pay | Admitting: Oncology

## 2017-06-18 VITALS — BP 132/53 | HR 66 | Temp 99.1°F | Resp 20 | Ht 67.0 in | Wt 237.1 lb

## 2017-06-18 DIAGNOSIS — F329 Major depressive disorder, single episode, unspecified: Secondary | ICD-10-CM | POA: Diagnosis not present

## 2017-06-18 DIAGNOSIS — F419 Anxiety disorder, unspecified: Secondary | ICD-10-CM

## 2017-06-18 DIAGNOSIS — C50512 Malignant neoplasm of lower-outer quadrant of left female breast: Secondary | ICD-10-CM

## 2017-06-18 DIAGNOSIS — Z171 Estrogen receptor negative status [ER-]: Secondary | ICD-10-CM | POA: Diagnosis not present

## 2017-06-18 LAB — CBC WITH DIFFERENTIAL/PLATELET
BASO%: 0.4 % (ref 0.0–2.0)
BASOS ABS: 0 10*3/uL (ref 0.0–0.1)
EOS ABS: 0.2 10*3/uL (ref 0.0–0.5)
EOS%: 2.5 % (ref 0.0–7.0)
HCT: 40.9 % (ref 34.8–46.6)
HGB: 13.5 g/dL (ref 11.6–15.9)
LYMPH%: 28.4 % (ref 14.0–49.7)
MCH: 29.3 pg (ref 25.1–34.0)
MCHC: 33 g/dL (ref 31.5–36.0)
MCV: 88.7 fL (ref 79.5–101.0)
MONO#: 0.4 10*3/uL (ref 0.1–0.9)
MONO%: 4.2 % (ref 0.0–14.0)
NEUT#: 5.4 10*3/uL (ref 1.5–6.5)
NEUT%: 64.5 % (ref 38.4–76.8)
Platelets: 288 10*3/uL (ref 145–400)
RBC: 4.61 10*6/uL (ref 3.70–5.45)
RDW: 13.2 % (ref 11.2–14.5)
WBC: 8.3 10*3/uL (ref 3.9–10.3)
lymph#: 2.4 10*3/uL (ref 0.9–3.3)

## 2017-06-18 LAB — COMPREHENSIVE METABOLIC PANEL
ALK PHOS: 55 U/L (ref 40–150)
ALT: 12 U/L (ref 0–55)
AST: 11 U/L (ref 5–34)
Albumin: 3.6 g/dL (ref 3.5–5.0)
Anion Gap: 7 mEq/L (ref 3–11)
BUN: 10.8 mg/dL (ref 7.0–26.0)
CALCIUM: 9.2 mg/dL (ref 8.4–10.4)
CHLORIDE: 107 meq/L (ref 98–109)
CO2: 26 mEq/L (ref 22–29)
Creatinine: 0.8 mg/dL (ref 0.6–1.1)
GLUCOSE: 141 mg/dL — AB (ref 70–140)
Potassium: 4.5 mEq/L (ref 3.5–5.1)
SODIUM: 140 meq/L (ref 136–145)
Total Bilirubin: 0.34 mg/dL (ref 0.20–1.20)
Total Protein: 7.2 g/dL (ref 6.4–8.3)

## 2017-06-18 MED ORDER — VENLAFAXINE HCL ER 75 MG PO CP24: 75.0000 mg | ORAL_CAPSULE | Freq: Every day | ORAL | 6 refills | Status: DC

## 2017-06-18 MED FILL — VENLAFAXINE HCL ER 75 MG CA: 75 | 30 days supply | Qty: 30 | Fill #0

## 2017-06-18 NOTE — Telephone Encounter (Signed)
Gave patient avs and calendar with appts per 11/14 los.  °

## 2017-06-24 ENCOUNTER — Encounter: Payer: Self-pay | Admitting: General Practice

## 2017-06-24 NOTE — Progress Notes (Signed)
[place] Spiritual Care Note  Phyllis Wiggins by phone per referral from Dr Jana Hakim via navigator [facility].  [name] and I have good rapport, and she shared frankly about how she is doing now.  Per pt, she is feeling much better than last week, after "learning that it's possible to have PTSD from cancer"--"Now I know that what I've been feeling could actually be normal, and I'm not crazy."  She describes feeling very down and having unexpected irritability, anger, and depression, and lability with all of those.  Per pt, in addition to the relief of validation of her experience, she is also utilizing guided meditation and essential oils to help with coping and managing distress.  Per pt, she has filled the antidepressant rx but has not started taking it. This call was limited by another appt she has, so I will phone her next week per her request to explore her needs in more detail.   [place], [place], [facility] Pager [phone] Voicemail [phone]

## 2017-06-30 ENCOUNTER — Encounter: Payer: Self-pay | Admitting: General Practice

## 2017-06-30 NOTE — Progress Notes (Signed)
[place] Spiritual Care Note  LVM for f/u support per pt request, including suggested times of availability and encouragement to return call.   [place], [place], [facility] Pager [phone] Voicemail [phone]

## 2017-07-08 ENCOUNTER — Encounter: Payer: Self-pay | Admitting: General Practice

## 2017-07-08 NOTE — Progress Notes (Signed)
[place] Spiritual Care Note  Received return VM from [place]. Per pt, she is doing better emotionally and doesn't need immediate support, but knows that spiritual care is available as desired. Due to her schedule, setting a phone appointment is difficult, so I sent her a handwritten card of encouragement.   [place], [place], [facility] Pager [phone] Voicemail [phone]

## 2017-07-24 IMAGING — MR MR BREAST BILATERAL W WO CONTRAST
9 of 15 series · 25 of 48 positions shown · IV contrast (multihance)
Comparison: Previous exam(s).

CLINICAL DATA: 44-year-old female with recent diagnosis of invasive
right breast cancer in the lower-inner quadrant following
ultrasound-guided biopsy on 06/15/2015 at [REDACTED]. An MRI on
06/21/2015 demonstrated a second enhancing mass adjacent to a larger
fibroadenoma in the superior right breast. This was subsequently
biopsied under ultrasound guidance demonstrating a benign
fibroadenoma. The patient has since undergone 5 of 6 cycles of
neoadjuvant chemotherapy.

LABS:  Not applicable.
EXAM:
BILATERAL BREAST MRI WITH AND WITHOUT CONTRAST
TECHNIQUE: Multiplanar, multisequence MR images of both breasts were obtained
prior to and following the intravenous administration of 19 ml of
MultiHance.

[Series 2: STIR · axial · 3.0mm · 0.94mm/px · 1 of 62 slices shown (1 of 3)]
[im 1/62]
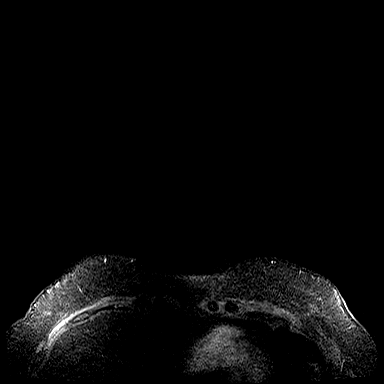

[Series 3: fl3d pre no · axial · non-contrast · 1.0mm · 0.80mm/px · z∈[-61,+114]mm · 3 of 176 slices shown (1 of 2)]
[im 1/176]
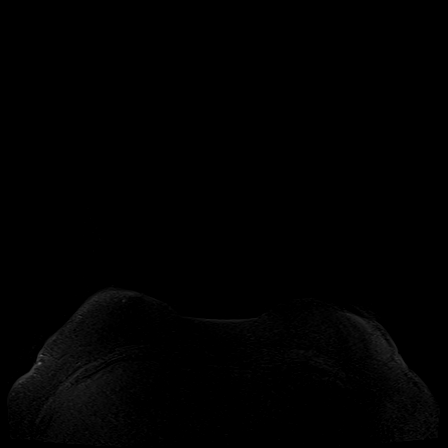
[im 88/176]
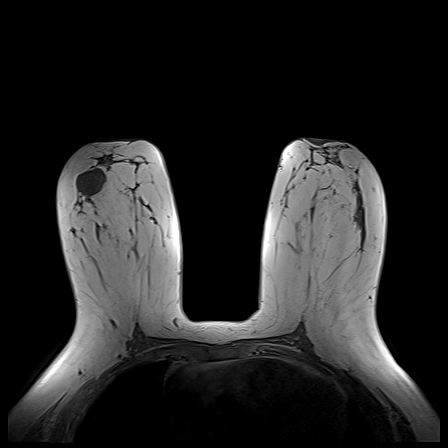
[im 176/176]
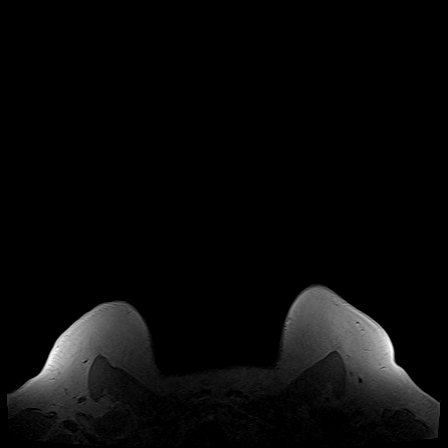

[Series 4: axial pre fs · axial · non-contrast · 1.0mm · 0.80mm/px · z∈[-77,+130]mm · 4 of 208 slices shown (1 of 2)]
[im 1/208]
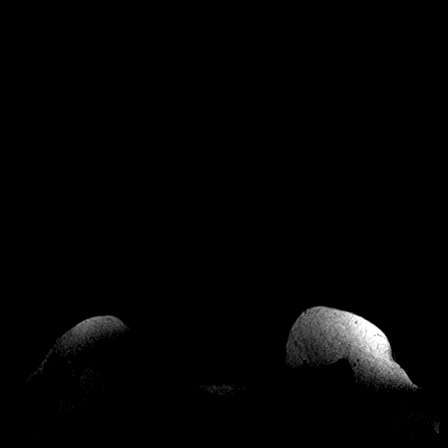
[im 70/208]
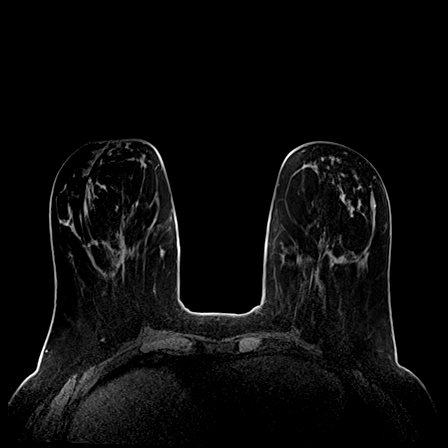
[im 139/208]
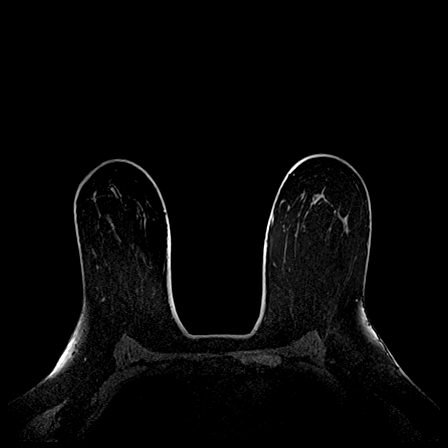
[im 208/208]
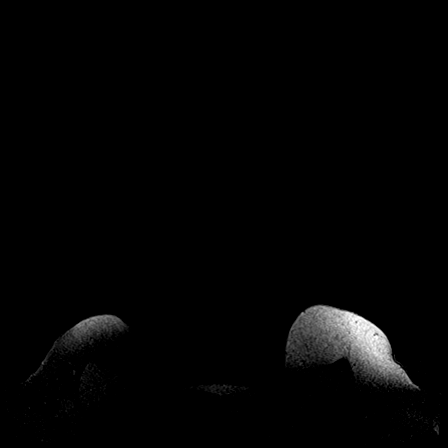

[Series 5: STIR · axial · 3.0mm · 0.94mm/px · 1 of 61 slices shown (2 of 3)]
[im 1/61]
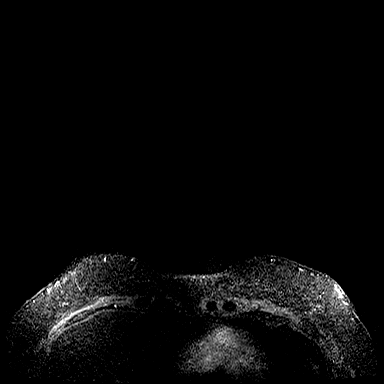

[Series 8: STIR · axial · 3.0mm · 0.94mm/px · 1 of 62 slices shown (3 of 3)]
[im 1/62]
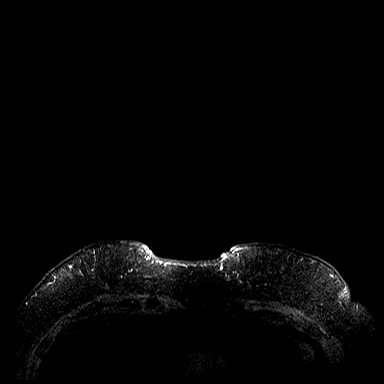

[Series 9: fl3d pre no · axial · non-contrast · 1.0mm · 0.80mm/px · z∈[-59,+116]mm · 3 of 176 slices shown (2 of 2)]
[im 1/176]
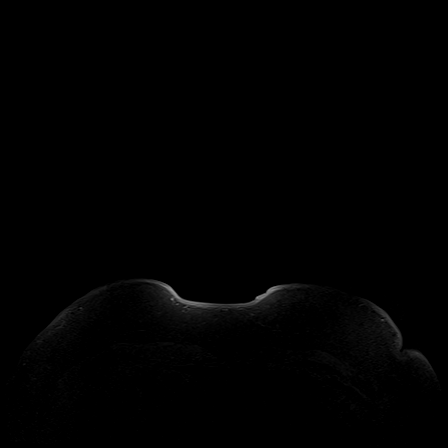
[im 88/176]
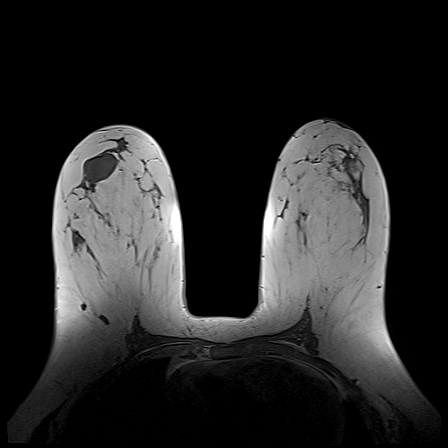
[im 176/176]
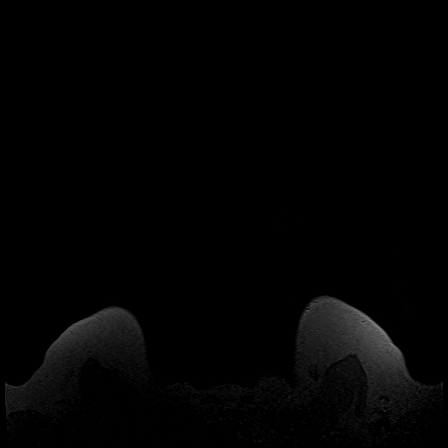

[Series 10: axial pre fs · axial · non-contrast · 1.0mm · 0.80mm/px · z∈[-75,+132]mm · 4 of 208 slices shown (2 of 2)]
[im 1/208]
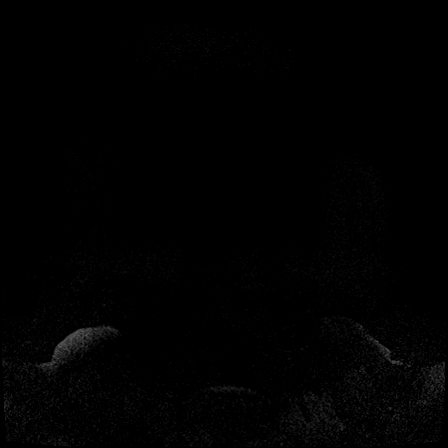
[im 70/208]
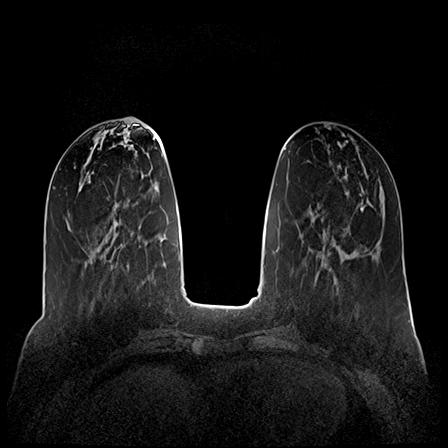
[im 139/208]
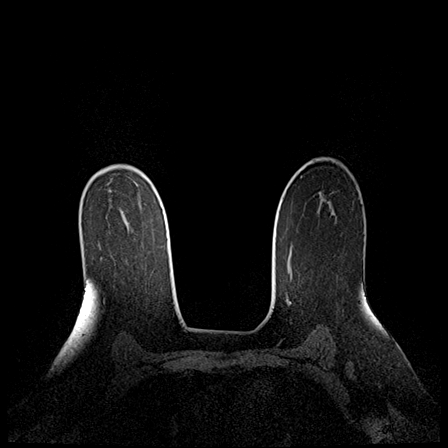
[im 208/208]
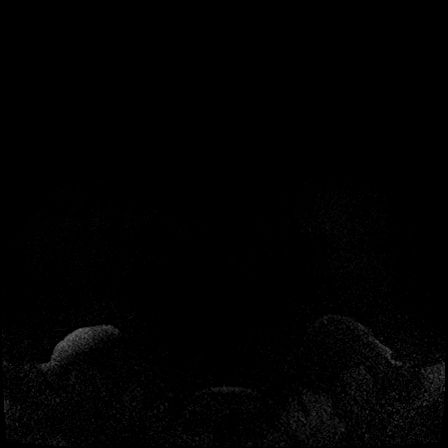

[Series 11: axial post 20 · axial · 1.0mm · 0.80mm/px · z∈[-75,+132]mm · 4 of 208 slices shown (1 of 2)]
[im 1/208]
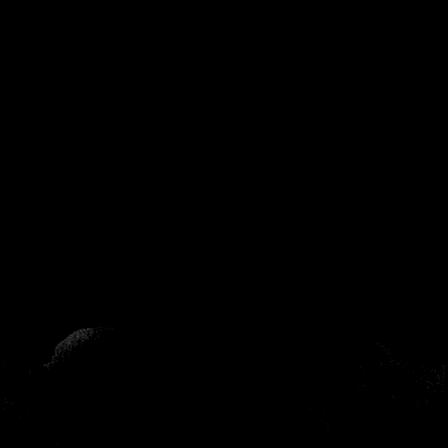
[im 70/208]
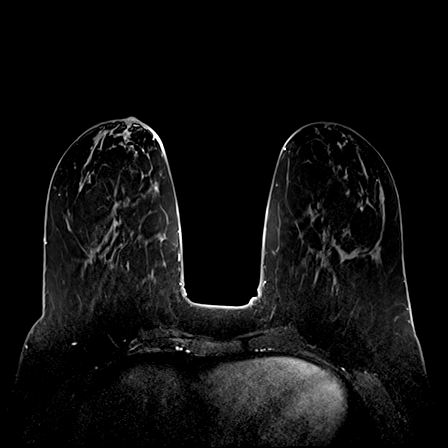
[im 139/208]
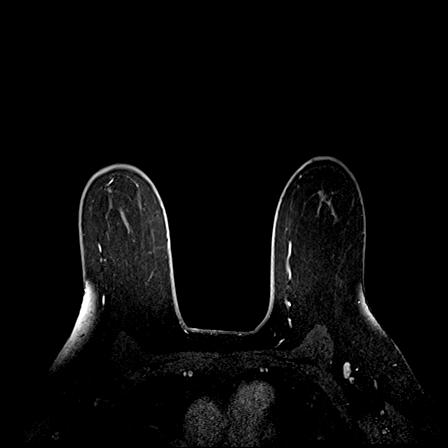
[im 208/208]
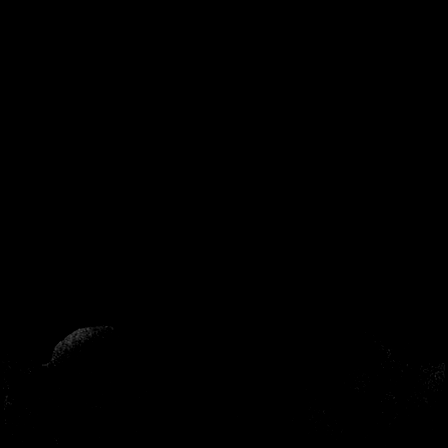

[Series 12: axial post 20 · axial · 1.0mm · 0.80mm/px · z∈[-75,+80]mm · 4 of 208 slices shown (2 of 2)]
[im 1/208]
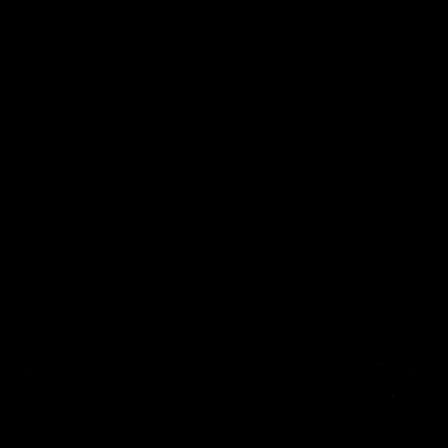
[im 52/208]
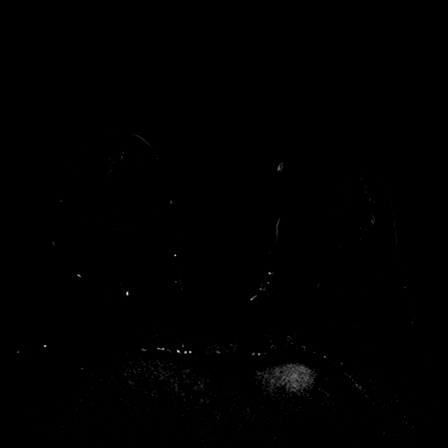
[im 104/208]
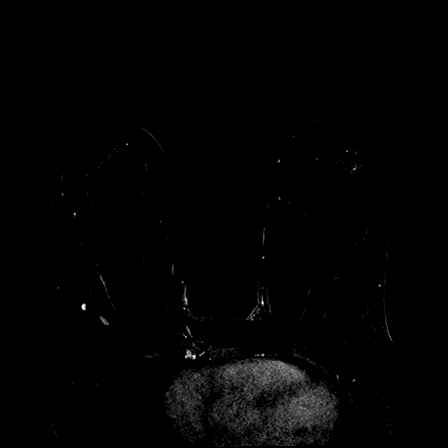
[im 156/208]
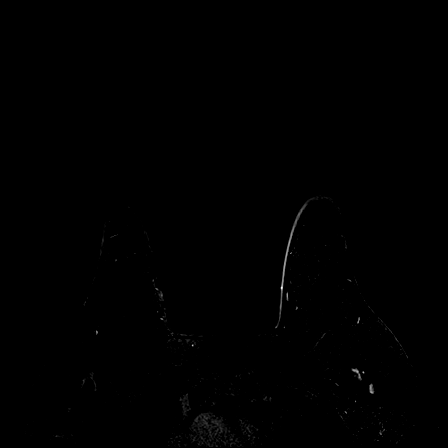

[25 of 48 positions shown; findings below may reference images not displayed]

THREE-DIMENSIONAL MR IMAGE RENDERING ON INDEPENDENT WORKSTATION:

Three-dimensional MR images were rendered by post-processing of the
original MR data on an independent workstation. The
three-dimensional MR images were interpreted, and findings are
reported in the following complete MRI report for this study. Three
dimensional images were evaluated at the independent [employer]
workstation
FINDINGS: Breast composition: b. Scattered fibroglandular tissue.

Background parenchymal enhancement: Minimal. There is a near
complete reduction in the background parenchymal enhancement since
the prior exam.

Right breast: Complete resolution of the enhancing mass in the
lower-inner quadrant of the right breast. Susceptibility artifact is
identified at this site, consistent with the biopsy marking clip.
Susceptibility is also seen in the superior right breast adjacent to
the larger benign mass, likely corresponding to the more recently
biopsied fibroadenoma. At the time of this dictation, the post
biopsy mammogram from that biopsy is not available for correlation.

Left breast: No mass or abnormal enhancement.

Lymph nodes: No abnormal appearing lymph nodes.

Ancillary findings: There are multiple bilateral enhancing foci
within the skin in the bilateral breasts with persistent kinetics.
These are bright on T2 weighted images. Examples are seen in the
right breast upper outer quadrant (series 12, images 81 and 87) and
in the lower inner quadrant (images 144 and 171), and in the left
breast lower inner quadrant (image 154 and medially in image 117).
IMPRESSION: 1. Complete resolution of the enhancing known malignancy in the
lower-inner quadrant of the right breast.

2. Multiple indeterminate bilateral enhancing foci within the skin
of the breasts.

3.  No MRI evidence of malignancy in the left breast.

RECOMMENDATION:
1. Physical exam of the skin of the bilateral breasts is recommended
to correlate with the small enhancing foci identified bilaterally.

2.  Continued treatment plan for the known right breast malignancy.

BI-RADS CATEGORY  6: Known biopsy-proven malignancy.

## 2017-11-17 ENCOUNTER — Other Ambulatory Visit: Payer: Self-pay | Admitting: *Deleted

## 2017-11-17 DIAGNOSIS — Z171 Estrogen receptor negative status [ER-]: Principal | ICD-10-CM

## 2017-11-17 DIAGNOSIS — C50512 Malignant neoplasm of lower-outer quadrant of left female breast: Secondary | ICD-10-CM

## 2017-11-17 NOTE — Progress Notes (Signed)
[place]  Telephone:([phone] Fax:([phone]) [phone]   ID: Lavaya Defreitas DOB: Sep 11, 1970  MR#: [id number]  TJQ#:[id number]  Patient Care Team: Patient, No Pcp Per as PCP - General (General Practice) Azara Gemme, Virgie Dad, MD as Consulting Physician (Oncology) Jovita Kussmaul, MD as Consulting Physician (General Surgery) Delice Bison, Charlestine Massed, NP as Nurse Practitioner (Hematology and Oncology) Bensimhon, Shaune Pascal, MD as Consulting Physician (Cardiology) Eppie Gibson, MD as Attending Physician (Radiation Oncology) PCP: Patient, No Pcp Per GYN: OTHER MD:   CHIEF COMPLAINT: HER-2 positive, 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 She brought it to medical attention when she presented for the free Pap smear screening clinic here 06/01/2015. Exam on that day showed a lump in the right breast at the 4:00 position under the areola. There was also a scar on the right outer breast. This scar was noted in 01/04/2008 when she had her baseline mammogram at [facility]. It had been stable for a year at that time and the patient had treated it with a "blood root paste" which caused scarring. 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 It is in a separate location from the new mass.  On 06/05/2015 [name] underwent bilateral diagnostic mammography at [facility], with bilateral ultrasonography. This found the breast density to be category B. There was a 2.5 cm oval mass in the right breast at the 5:00 position which by ultrasound measured 2.1 cm. In the left breast there was an area of focal asymmetry which by ultrasound was consistent with fibroglandular tissue/benign.  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  Her subsequent history is as detailed below.  INTERVAL HISTORY: Ayona returns today for follow-up of her estrogen receptor negative breast cancer . She continues under observation alone. She has not taken the Effexor.  She had it filled but decided against it.  Alternatively, she began listening to an anti-anxiety station, which helped her.   REVIEW OF SYSTEMS:  Vaidehi reports that she loves books. She went shopping with her mother recently and said she didn't need any more books. She is gaining more dog walking clients. She won a Secretary/administrator for gym classes and horseback riding sessions, so she plans on doing those for exercise soon. She denies unusual headaches, visual changes, nausea, vomiting, or dizziness. There has been no unusual cough, phlegm production, or pleurisy. This been no change in bowel or bladder habits. She denies unexplained fatigue or unexplained weight loss, bleeding, rash, or fever. A detailed review of systems was otherwise stable.    PAST MEDICAL HISTORY: Past Medical History:  Diagnosis Date  . Arthritis   . Breast cancer ([place])   . Breast cancer of lower-outer quadrant of right female breast ([place]) 06/20/2015  . Family history of stomach cancer   . GERD (gastroesophageal reflux disease)   . Hx of radiation therapy 12/18/15- 02/01/16   Right Breast    PAST SURGICAL HISTORY: Past Surgical History:  Procedure Laterality Date  . BREAST LUMPECTOMY WITH RADIOACTIVE SEED AND SENTINEL LYMPH NODE BIOPSY Right 11/09/2015   Procedure: RIGHT BREAST LUMPECTOMY WITH RADIOACTIVE SEED AND SENTINEL  LYMPH NODE BIOPSY AND EXCISION OF RIGHT BREAST FIBROADENOMA;  Surgeon: Autumn Messing III, MD;  Location: [place];  Service: General;  Laterality: Right;  . PORT-A-CATH REMOVAL Left 08/26/2016   Procedure: REMOVAL  PORT-A-CATH;  Surgeon: Autumn Messing III, MD;  Location: [place];  Service: General;  Laterality: Left;  . PORTACATH PLACEMENT Left 07/06/2015   Procedure: INSERTION PORT-A-CATH;  Surgeon: Autumn Messing III, MD;  Location: [place];  Service: General;  Laterality: Left;  . WISDOM TOOTH EXTRACTION      FAMILY HISTORY Family History  Problem Relation Age of Onset  . Diabetes Mother   . Hypertension Mother   . Hyperlipidemia Mother   . Stomach cancer Maternal Grandfather 22  . Diabetes Paternal Grandmother   . Dementia Maternal Uncle   . Congestive Heart Failure Maternal Grandmother    the patient's parents are both living, in their early 54s. The patient had no brother, one sister. The patient's mother works by [employer] for private clients. The patient's sister is a Pharmacist, hospital in [place] there is no history of breast or ovarian cancer in the family. The patient's paternal grandfather died at the age of 84 from stomach cancer felt to be related to iron unusual toxic exposure   GYNECOLOGIC HISTORY:  No LMP recorded.  menarche age 38, she is Lindsey P0  she is still having periods but they're quite sporadic. They may occur 6 weeks apart or 3 weeks apart, may be heavy or only spotting. 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 She is divorced and at home lives with 6 dogs.     ADVANCED DIRECTIVES:  not in place    HEALTH MAINTENANCE: Social History   Tobacco Use  . Smoking status: Former Smoker    Types: Cigarettes    Last attempt to quit: 07/04/2010    Years since quitting: 7.3  . Smokeless tobacco: Never Used  Substance Use Topics  . Alcohol use: No  . Drug use: No     Colonoscopy:  PAP: October 2016   Bone density:  Lipid panel:  Allergies    Allergen Reactions  . Cheese   . Dairy Aid [Lactase]     "sinus infections"  . Whey     Sinus issues  . Wine [Alcohol]     Severe sinus issues due to fermentation    Current Outpatient Medications  Medication Sig Dispense Refill  . acetaminophen (TYLENOL) 650 MG CR tablet Take 650 mg by mouth every 8 (eight) hours as needed for pain.    . Biotin 10000 MCG TABS Take 20,000 mcg by mouth.    . losartan (COZAAR) 25 MG tablet Take 1 tablet (25 mg total) by mouth at bedtime. 30 tablet 6  . meclizine (ANTIVERT) 25 MG tablet Take 25 mg by mouth as needed for dizziness. Reported on 01/31/2016    . venlafaxine XR (EFFEXOR-XR) 75 MG 24 hr capsule Take 1 capsule (75 mg total) daily with breakfast by mouth. 30 capsule 6   No current facility-administered medications for this visit.     OBJECTIVE: Middle-aged white woman in no acute distress  Vitals:   11/18/17 1138  BP: (!) 142/69  Pulse: 85  Resp: 18  Temp: 98.2 F (36.8 C)  SpO2: 97%  Body mass index is 37.07 kg/m.    ECOG FS:1 - Symptomatic but completely ambulatory  Sclerae unicteric, pupils round and equal Oropharynx clear and moist No cervical or supraclavicular adenopathy Lungs no rales or rhonchi Heart regular rate and rhythm Abd soft, nontender, positive bowel sounds MSK no focal spinal tenderness, no upper extremity lymphedema Neuro: nonfocal, well oriented, appropriate affect Breasts: The right breast is undergone lumpectomy and radiation.  There is no evidence of local recurrence.  The left breast is unremarkable.  Both axillae are benign  LAB RESULTS:  CMP     Component Value Date/Time   NA 140 06/18/2017 1054   K 4.5 06/18/2017 1054   CO2 26 06/18/2017 1054   GLUCOSE 141 (H) 06/18/2017 1054   BUN 10.8 06/18/2017 1054   CREATININE 0.8 06/18/2017 1054   CALCIUM 9.2 06/18/2017 1054   PROT 7.2 06/18/2017 1054   ALBUMIN 3.6 06/18/2017 1054   AST 11 06/18/2017 1054   ALT 12 06/18/2017 1054   ALKPHOS 55  06/18/2017 1054   BILITOT 0.34 06/18/2017 1054   GFRNONAA >60 10/11/2016 0735   GFRAA >60 10/11/2016 0735    INo results found for: SPEP, UPEP  Lab Results  Component Value Date   WBC 8.2 11/18/2017   NEUTROABS 5.5 11/18/2017   HGB 13.5 06/18/2017   HCT 42.5 11/18/2017   MCV 87.8 11/18/2017   PLT 295 11/18/2017      Chemistry      Component Value Date/Time   NA 140 06/18/2017 1054   K 4.5 06/18/2017 1054   CO2 26 06/18/2017 1054   BUN 10.8 06/18/2017 1054   CREATININE 0.8 06/18/2017 1054      Component Value Date/Time   CALCIUM 9.2 06/18/2017 1054   ALKPHOS 55 06/18/2017 1054   AST 11 06/18/2017 1054   ALT 12 06/18/2017 1054   BILITOT 0.34 06/18/2017 1054       No results found for: LABCA2  No components found for: LABCA125  No results for input(s): INR in the last 168 hours.  Urinalysis No results found for: COLORURINE, APPEARANCEUR, LABSPEC, PHURINE, GLUCOSEU, HGBUR, BILIRUBINUR, KETONESUR, PROTEINUR, UROBILINOGEN, NITRITE, LEUKOCYTESUR  STUDIES: No results found.   ASSESSMENT: 47 y.o. BRCA negative [place] woman status post left breast lower outer quadrant biopsy 06/15/2015 for a clinical T1 cN0, stage IA invasive ductal carcinoma, grade 3, estrogen and progesterone receptor negative, HER-2 amplified, with an MIB-1 of 40%  (1) neoadjuvant chemotherapy consisting of carboplatin, docetaxel, pertuzumab, and trastuzumab with onpro support every 3 weeks 6 started 07/11/2015, last dose T/P 10/25/2015  (a) carboplatin and docetaxel stopped after 5 cycles due to patient intolerance  (2) Status post right lumpectomy and sentinel lymph node sampling for 01/23/2016 showing a complete pathologic response in the breast and 3 sentinel lymph nodes sampled, ypT0 ypN0  (3) adjuvant radiation completed 01/31/2016  (4) trastuzumab started 11/14/2015, continued to 06/12/2016, stopped for a mild drop in her ejection fraction  (a) echocardiogram 03/21/2016 shows an  ejection fraction of 55-60%  (b) echocardiography 06/19/2016 showed an ejection fraction in the 45-50% range  (c)Echocardiography 09/19/2016 shows an ejection fraction in the 50-55% range  (5) genetics testing 08/16/2015 through the Breast/Ovarian gene panel offered by [facility] i found no deleterious mutations in ATM, BARD1, BRCA1, BRCA2, BRIP1, CDH1, CHEK2, EPCAM, FANCC, MLH1, MSH2, MSH6, NBN, PALB2, PMS2, PTEN, RAD51C, RAD51D, TP53, and XRCC2.   PLAN: Earsie is now just about 2 years out from definitive surgery for breast cancer with no evidence of  disease recurrence.  This is very favorable.  Psychologically she is doing much better.  This is gratifying to see.  She never did take the venlafaxine.  She managed to treat herself with a CD for anxiety and she will let me know what it was so I can offer to other patients as well  She is considering birth control pills for period control.  The use of that in breast cancer patients even if ER negative it is controversial.  We discussed Zoladex and she will give that some thought  Otherwise she will see me again in 1 year.  She knows to call for any problems that may develop before the next visit.  Illias Pantano, Virgie Dad, MD  11/18/17 12:02 PM Medical Oncology and Hematology [facility] [address] [place], [place] [address] Tel. [phone]    Fax. [phone]    This document serves as a record of services personally performed by Lurline Del, MD. It was created on his behalf by Sheron Nightingale, a trained medical scribe. The creation of this record is based on the scribe's personal observations and the provider's statements to them.   I have reviewed the above documentation for accuracy and completeness, and I agree with the above.

## 2017-11-18 ENCOUNTER — Inpatient Hospital Stay (HOSPITAL_BASED_OUTPATIENT_CLINIC_OR_DEPARTMENT_OTHER): Payer: Medicaid Other | Admitting: Oncology

## 2017-11-18 ENCOUNTER — Inpatient Hospital Stay: Payer: Medicaid Other | Attending: Oncology

## 2017-11-18 VITALS — BP 142/69 | HR 85 | Temp 98.2°F | Resp 18 | Ht 67.0 in | Wt 236.7 lb

## 2017-11-18 DIAGNOSIS — C50512 Malignant neoplasm of lower-outer quadrant of left female breast: Secondary | ICD-10-CM | POA: Diagnosis not present

## 2017-11-18 DIAGNOSIS — Z171 Estrogen receptor negative status [ER-]: Secondary | ICD-10-CM | POA: Diagnosis not present

## 2017-11-18 DIAGNOSIS — C50511 Malignant neoplasm of lower-outer quadrant of right female breast: Secondary | ICD-10-CM | POA: Insufficient documentation

## 2017-11-18 LAB — CBC WITH DIFFERENTIAL (CANCER CENTER ONLY)
Basophils Absolute: 0.1 10*3/uL (ref 0.0–0.1)
Basophils Relative: 1 %
EOS ABS: 0.2 10*3/uL (ref 0.0–0.5)
Eosinophils Relative: 2 %
HCT: 42.5 % (ref 34.8–46.6)
HEMOGLOBIN: 14.3 g/dL (ref 11.6–15.9)
LYMPHS ABS: 2.1 10*3/uL (ref 0.9–3.3)
Lymphocytes Relative: 25 %
MCH: 29.6 pg (ref 25.1–34.0)
MCHC: 33.7 g/dL (ref 31.5–36.0)
MCV: 87.8 fL (ref 79.5–101.0)
MONOS PCT: 5 %
Monocytes Absolute: 0.4 10*3/uL (ref 0.1–0.9)
NEUTROS PCT: 67 %
Neutro Abs: 5.5 10*3/uL (ref 1.5–6.5)
Platelet Count: 295 10*3/uL (ref 145–400)
RBC: 4.84 MIL/uL (ref 3.70–5.45)
RDW: 13.1 % (ref 11.2–14.5)
WBC Count: 8.2 10*3/uL (ref 3.9–10.3)

## 2017-11-18 LAB — CMP (CANCER CENTER ONLY)
ALBUMIN: 3.9 g/dL (ref 3.5–5.0)
ALT: 17 U/L (ref 0–55)
AST: 14 U/L (ref 5–34)
Alkaline Phosphatase: 56 U/L (ref 40–150)
Anion gap: 10 (ref 3–11)
BUN: 11 mg/dL (ref 7–26)
CHLORIDE: 103 mmol/L (ref 98–109)
CO2: 23 mmol/L (ref 22–29)
CREATININE: 0.83 mg/dL (ref 0.60–1.10)
Calcium: 9.7 mg/dL (ref 8.4–10.4)
GFR, Estimated: >60 mL/min (ref >60)
Glucose, Bld: 124 mg/dL (ref 70–140)
Potassium: 4.1 mmol/L (ref 3.5–5.1)
SODIUM: 136 mmol/L (ref 136–145)
Total Bilirubin: 0.3 mg/dL (ref 0.2–1.2)
Total Protein: 7.5 g/dL (ref 6.4–8.3)

## 2018-11-13 ENCOUNTER — Telehealth: Payer: Self-pay | Admitting: Oncology

## 2018-11-13 NOTE — Telephone Encounter (Signed)
Returned call pt patient re cancelling 4/16 appointments. Not able to reach patient. Left message confirming cancellation and asked that patient call office to reschedule.

## 2018-11-16 ENCOUNTER — Telehealth: Payer: Self-pay | Admitting: Oncology

## 2018-11-16 NOTE — Telephone Encounter (Signed)
Scheduled appt per 4/09 sch message - mailed letter with appt date and time

## 2018-11-19 ENCOUNTER — Other Ambulatory Visit: Payer: Medicaid Other

## 2018-11-19 ENCOUNTER — Ambulatory Visit: Payer: Medicaid Other | Admitting: Oncology

## 2019-01-17 ENCOUNTER — Other Ambulatory Visit: Payer: Self-pay | Admitting: Oncology

## 2019-02-17 ENCOUNTER — Telehealth: Payer: Self-pay | Admitting: Adult Health

## 2019-02-17 NOTE — Telephone Encounter (Signed)
GM PAL 7/23 f/u moved to [facility] per GM. Left message. Schedule mailed.

## 2019-02-25 ENCOUNTER — Other Ambulatory Visit: Payer: Medicaid Other

## 2019-02-25 ENCOUNTER — Ambulatory Visit: Payer: Medicaid Other | Admitting: Adult Health

## 2019-02-25 ENCOUNTER — Ambulatory Visit: Payer: Medicaid Other | Admitting: Oncology

## 2019-03-16 ENCOUNTER — Other Ambulatory Visit: Payer: Self-pay | Admitting: Adult Health

## 2019-03-16 DIAGNOSIS — C50512 Malignant neoplasm of lower-outer quadrant of left female breast: Secondary | ICD-10-CM

## 2019-03-17 ENCOUNTER — Encounter: Payer: Self-pay | Admitting: Adult Health

## 2019-03-17 ENCOUNTER — Inpatient Hospital Stay: Payer: PRIVATE HEALTH INSURANCE | Attending: Adult Health

## 2019-03-17 ENCOUNTER — Inpatient Hospital Stay (HOSPITAL_BASED_OUTPATIENT_CLINIC_OR_DEPARTMENT_OTHER): Payer: PRIVATE HEALTH INSURANCE | Admitting: Adult Health

## 2019-03-17 ENCOUNTER — Other Ambulatory Visit: Payer: Self-pay

## 2019-03-17 VITALS — BP 121/74 | HR 73 | Temp 97.6°F | Resp 17 | Ht 67.0 in | Wt 231.8 lb

## 2019-03-17 DIAGNOSIS — H539 Unspecified visual disturbance: Secondary | ICD-10-CM | POA: Insufficient documentation

## 2019-03-17 DIAGNOSIS — C50512 Malignant neoplasm of lower-outer quadrant of left female breast: Secondary | ICD-10-CM | POA: Diagnosis present

## 2019-03-17 DIAGNOSIS — Z171 Estrogen receptor negative status [ER-]: Secondary | ICD-10-CM

## 2019-03-17 DIAGNOSIS — Z87891 Personal history of nicotine dependence: Secondary | ICD-10-CM | POA: Diagnosis not present

## 2019-03-17 LAB — CBC WITH DIFFERENTIAL (CANCER CENTER ONLY)
Abs Immature Granulocytes: 0.01 10*3/uL (ref 0.00–0.07)
Basophils Absolute: 0.1 10*3/uL (ref 0.0–0.1)
Basophils Relative: 1 %
Eosinophils Absolute: 0.2 10*3/uL (ref 0.0–0.5)
Eosinophils Relative: 3 %
HCT: 43.3 % (ref 36.0–46.0)
Hemoglobin: 14.3 g/dL (ref 12.0–15.0)
Immature Granulocytes: 0 %
Lymphocytes Relative: 30 %
Lymphs Abs: 2.4 10*3/uL (ref 0.7–4.0)
MCH: 28.6 pg (ref 26.0–34.0)
MCHC: 33 g/dL (ref 30.0–36.0)
MCV: 86.6 fL (ref 80.0–100.0)
Monocytes Absolute: 0.4 10*3/uL (ref 0.1–1.0)
Monocytes Relative: 4 %
Neutro Abs: 4.9 10*3/uL (ref 1.7–7.7)
Neutrophils Relative %: 62 %
Platelet Count: 296 10*3/uL (ref 150–400)
RBC: 5 MIL/uL (ref 3.87–5.11)
RDW: 12.7 % (ref 11.5–15.5)
WBC Count: 7.9 10*3/uL (ref 4.0–10.5)
nRBC: 0 % (ref 0.0–0.2)

## 2019-03-17 LAB — CMP (CANCER CENTER ONLY)
ALT: 26 U/L (ref 0–44)
AST: 19 U/L (ref 15–41)
Albumin: 3.8 g/dL (ref 3.5–5.0)
Alkaline Phosphatase: 52 U/L (ref 38–126)
Anion gap: 14 (ref 5–15)
BUN: 12 mg/dL (ref 6–20)
CO2: 24 mmol/L (ref 22–32)
Calcium: 9.4 mg/dL (ref 8.9–10.3)
Chloride: 100 mmol/L (ref 98–111)
Creatinine: 0.91 mg/dL (ref 0.44–1.00)
GFR, Est AFR Am: >60 mL/min (ref >60)
GFR, Estimated: >60 mL/min (ref >60)
Glucose, Bld: 112 mg/dL — ABNORMAL HIGH (ref 70–99)
Potassium: 4.1 mmol/L (ref 3.5–5.1)
Sodium: 138 mmol/L (ref 135–145)
Total Bilirubin: 0.4 mg/dL (ref 0.3–1.2)
Total Protein: 7.2 g/dL (ref 6.5–8.1)

## 2019-03-17 NOTE — Progress Notes (Signed)
Phyllis Wiggins  [phone] [phone]   ID: Phyllis Wiggins DOB: 27-Jun-1971  MR#: [id number]  BZJ#:[id number]  Patient Care Team: Patient, No Pcp Per as PCP - General (General Practice) Phyllis Wiggins, Phyllis Dad, MD as Consulting Physician (Oncology) Jovita Kussmaul, MD as Consulting Physician (General Surgery) Delice Bison, Phyllis Massed, NP as Nurse Practitioner (Hematology and Oncology) Bensimhon, Shaune Pascal, MD as Consulting Physician (Cardiology) Eppie Gibson, MD as Attending Physician (Radiation Oncology) PCP: Patient, No Pcp Per GYN: OTHER MD:   CHIEF COMPLAINT: HER-2 positive, 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 She brought it to medical attention when she presented for the free Pap smear screening clinic here 06/01/2015. Exam on that day showed a lump in the right breast at the 4:00 position under the areola. There was also a scar on the right outer breast. This scar was noted in 01/04/2008 when she had her baseline mammogram at [facility]. It had been stable for a year at that time and the patient had treated it with a "blood root paste" which caused scarring. 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 It is in a separate location from the new mass.  On 06/05/2015 Phyllis Wiggins underwent bilateral diagnostic mammography at [facility], with bilateral ultrasonography. This found the breast density to be category B. There was a 2.5 cm oval mass in the right breast at the 5:00 position which by ultrasound measured 2.1 cm. In the left breast there was an area of focal asymmetry which by ultrasound was consistent with fibroglandular tissue/benign.  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  Her subsequent history is as detailed below.  INTERVAL HISTORY: Phyllis Wiggins returns today for follow-up of her estrogen receptor negative breast cancer.  She continues under observation alone.   Since her last visit she underwent 3D diagnostic bilateral mammogram on 08/26/2018.  It showed no mammographic evidence of malignancy.  Breast density was category B.    REVIEW OF SYSTEMS:  [name] has been doing well.  She tells me that she hasn't had follow up with a PCP or gynecology because she has limited insurance and resources to afford these visits.  She thinks she is overdue for her regular visits and would appreciate someone to talk to her about where to reach out for resources.  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  She has noted a new vision change with flashing lights only in her left lower vision field that is intermittent.  She denies any other symptoms such as headaches.  She wants to know what symptoms she should reach out to [place] about.   Phyllis Wiggins denies any new issus such as fever, chills, chest pains, palpitations, cough, bowel/bladder changes, weakness, nausea, vomiting.  A detailed ROS was otherwise non contributory.    PAST MEDICAL HISTORY: Past Medical History:  Diagnosis Date  . Arthritis   . Breast cancer ([place])   . Breast cancer of lower-outer quadrant of right female breast ([place]) 06/20/2015  . Family history of stomach cancer   . GERD (gastroesophageal reflux disease)   . Hx of  radiation therapy 12/18/15- 02/01/16   Right Breast    PAST SURGICAL HISTORY: Past Surgical History:  Procedure Laterality Date  . BREAST LUMPECTOMY WITH RADIOACTIVE SEED AND SENTINEL LYMPH NODE BIOPSY Right 11/09/2015   Procedure: RIGHT BREAST LUMPECTOMY WITH RADIOACTIVE SEED AND SENTINEL LYMPH NODE BIOPSY AND EXCISION OF  RIGHT BREAST FIBROADENOMA;  Surgeon: Autumn Messing III, MD;  Location: [facility];  Service: General;  Laterality: Right;  . PORT-A-CATH REMOVAL Left 08/26/2016   Procedure: REMOVAL PORT-A-CATH;  Surgeon: Autumn Messing III, MD;  Location: [facility];  Service: General;  Laterality: Left;  . PORTACATH PLACEMENT Left 07/06/2015   Procedure: INSERTION PORT-A-CATH;  Surgeon: Autumn Messing III, MD;  Location: [facility];  Service: General;  Laterality: Left;  . WISDOM TOOTH EXTRACTION      FAMILY HISTORY Family History  Problem Relation Age of Onset  . Diabetes Mother   . Hypertension Mother   . Hyperlipidemia Mother   . Stomach cancer Maternal Grandfather 27  . Diabetes Paternal Grandmother   . Dementia Maternal Uncle   . Congestive Heart Failure Maternal Grandmother    the patient's parents are both living, in their early 49s. The patient had no brother, one sister. The patient's mother works by [employer] administrator homes for private clients. The patient's sister is a Pharmacist, hospital in [place] there is no history of breast or ovarian cancer in the family. The patient's paternal grandfather died at the age of 5 from stomach cancer felt to be related to iron unusual toxic exposure   GYNECOLOGIC HISTORY:  No LMP recorded.  menarche age 33, she is [place] P0  she is still having periods but they're quite sporadic. They may occur 6 weeks apart or 3 weeks apart, may be heavy or only spotting. 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 She is divorced and at home lives with 6 dogs.     ADVANCED DIRECTIVES:  not in place    HEALTH MAINTENANCE: Social History   Tobacco Use  . Smoking status: Former Smoker    Types: Cigarettes    Quit date: 07/04/2010    Years since quitting: 8.7   . Smokeless tobacco: Never Used  Substance Use Topics  . Alcohol use: No  . Drug use: No     Colonoscopy:  PAP: October 2016   Bone density:  Lipid panel:  Allergies  Allergen Reactions  . Cheese   . Dairy Aid [Lactase]     "sinus infections"  . Whey     Sinus issues  . Wine [Alcohol]     Severe sinus issues due to fermentation    No current outpatient medications on file.   No current facility-administered medications for this visit.     OBJECTIVE:  Vitals:   03/17/19 1103  BP: 121/74  Pulse: 73  Resp: 17  Temp: 97.6 F (36.4 C)  SpO2: 99%     Body mass index is 36.31 kg/m.    ECOG FS:1 - Symptomatic but completely ambulatory GENERAL: Patient is a well appearing female in no acute distress HEENT:  Sclerae anicteric.  Oropharynx clear and moist. No ulcerations or evidence of oropharyngeal candidiasis. Neck is supple.  NODES:  No cervical, supraclavicular, or axillary lymphadenopathy palpated.  BREAST EXAM: right breast s/p lumpectomy, no sign of  local recurrence, left breast benign. LUNGS:  Clear to auscultation bilaterally.  No wheezes or rhonchi. HEART:  Regular rate and rhythm. No murmur appreciated. ABDOMEN:  Soft, nontender.  Positive, normoactive bowel sounds. No organomegaly palpated. MSK:  No focal spinal tenderness to palpation. Full range of motion bilaterally in the upper extremities. EXTREMITIES:  No peripheral edema.  Strength 5/5 bilaterally in upper and lower extremities SKIN:  Clear with no obvious rashes or skin changes. No nail dyscrasia. NEURO:  Nonfocal. Well oriented.  Appropriate affect. CN II-XII intact   LAB RESULTS:  CMP     Component Value Date/Time   NA 138 03/17/2019 1011   NA 140 06/18/2017 1054   K 4.1 03/17/2019 1011   K 4.5 06/18/2017 1054   CL 100 03/17/2019 1011   CO2 24 03/17/2019 1011   CO2 26 06/18/2017 1054   GLUCOSE 112 (H) 03/17/2019 1011   GLUCOSE 141 (H) 06/18/2017 1054   BUN 12 03/17/2019 1011   BUN 10.8  06/18/2017 1054   CREATININE 0.91 03/17/2019 1011   CREATININE 0.8 06/18/2017 1054   CALCIUM 9.4 03/17/2019 1011   CALCIUM 9.2 06/18/2017 1054   PROT 7.2 03/17/2019 1011   PROT 7.2 06/18/2017 1054   ALBUMIN 3.8 03/17/2019 1011   ALBUMIN 3.6 06/18/2017 1054   AST 19 03/17/2019 1011   AST 11 06/18/2017 1054   ALT 26 03/17/2019 1011   ALT 12 06/18/2017 1054   ALKPHOS 52 03/17/2019 1011   ALKPHOS 55 06/18/2017 1054   BILITOT 0.4 03/17/2019 1011   BILITOT 0.34 06/18/2017 1054   GFRNONAA >60 03/17/2019 1011   GFRAA >60 03/17/2019 1011    INo results found for: SPEP, UPEP  Lab Results  Component Value Date   WBC 7.9 03/17/2019   NEUTROABS 4.9 03/17/2019   HGB 14.3 03/17/2019   HCT 43.3 03/17/2019   MCV 86.6 03/17/2019   PLT 296 03/17/2019      Chemistry      Component Value Date/Time   NA 138 03/17/2019 1011   NA 140 06/18/2017 1054   K 4.1 03/17/2019 1011   K 4.5 06/18/2017 1054   CL 100 03/17/2019 1011   CO2 24 03/17/2019 1011   CO2 26 06/18/2017 1054   BUN 12 03/17/2019 1011   BUN 10.8 06/18/2017 1054   CREATININE 0.91 03/17/2019 1011   CREATININE 0.8 06/18/2017 1054      Component Value Date/Time   CALCIUM 9.4 03/17/2019 1011   CALCIUM 9.2 06/18/2017 1054   ALKPHOS 52 03/17/2019 1011   ALKPHOS 55 06/18/2017 1054   AST 19 03/17/2019 1011   AST 11 06/18/2017 1054   ALT 26 03/17/2019 1011   ALT 12 06/18/2017 1054   BILITOT 0.4 03/17/2019 1011   BILITOT 0.34 06/18/2017 1054       No results found for: LABCA2  No components found for: LABCA125  No results for input(s): INR in the last 168 hours.  Urinalysis No results found for: COLORURINE, APPEARANCEUR, LABSPEC, PHURINE, GLUCOSEU, HGBUR, BILIRUBINUR, KETONESUR, PROTEINUR, UROBILINOGEN, NITRITE, LEUKOCYTESUR  STUDIES:  ASSESSMENT: 48 y.o. BRCA negative [place] woman status post left breast lower outer quadrant biopsy 06/15/2015 for a clinical T1 cN0, stage IA invasive ductal carcinoma, grade 3,  estrogen and progesterone receptor negative, HER-2 amplified, with an MIB-1 of 40%  (1) neoadjuvant chemotherapy consisting of carboplatin, docetaxel, pertuzumab, and trastuzumab with onpro support every 3 weeks 6 started 07/11/2015, last dose T/P 10/25/2015  (a) carboplatin and docetaxel stopped after 5 cycles due  to patient intolerance  (2) Status post right lumpectomy and sentinel lymph node sampling for 01/23/2016 showing a complete pathologic response in the breast and 3 sentinel lymph nodes sampled, ypT0 ypN0  (3) adjuvant radiation completed 01/31/2016  (4) trastuzumab started 11/14/2015, continued to 06/12/2016, stopped for a mild drop in her ejection fraction  (a) echocardiogram 03/21/2016 shows an ejection fraction of 55-60%  (b) echocardiography 06/19/2016 showed an ejection fraction in the 45-50% range  (c)Echocardiography 09/19/2016 shows an ejection fraction in the 50-55% range  (5) genetics testing 08/16/2015 through the Breast/Ovarian gene panel offered by [facility] i found no deleterious mutations in ATM, BARD1, BRCA1, BRCA2, BRIP1, CDH1, CHEK2, EPCAM, FANCC, MLH1, MSH2, MSH6, NBN, PALB2, PMS2, PTEN, RAD51C, RAD51D, TP53, and XRCC2.   PLAN: Phyllis Wiggins is doing well today.  She continues on observation for her history of breast cancer.  She is doing well and has no signs of recurrence at this point.  We reviewed her vision change. I recommended she see her eye doctor, and if that checks out, then to consider brain MRI.  She would like to wait on this for now, and will call us if she develops any further symptoms.  Her neuro exam was intact today.  Phyllis Wiggins and I reviewed healthy diet and exercise.  She has started a weight loss plan and is down 12 puonds.  I encouraged her to continue this.  I reached out to our social workers and cancer outreach team to see about how to get patient in for care with PCP and BCCCP if eligible.    I reviewed common symptoms to report.  I encouraged  Daliyah that if she is not sure if she has a symptom of recurrence to please call us, so that we can review with her over the phone and determine if she needs an appointment.  She understands this.    Phyllis Wiggins will return in 1 year for labs and f/u with Dr. Jana Hakim.  She was recommended to continue with the appropriate pandemic precautions. She knows to call for any questions that may arise between now and her next appointment.  We are happy to see her sooner if needed.  A total of (30) minutes of face-to-face time was spent with this patient with greater than 50% of that time in counseling and care-coordination.   Wilber Bihari, NP  03/22/19 9:29 AM Medical Oncology and Hematology [facility] [place], [place] [address] Tel. [phone]    Fax. [phone]

## 2019-03-18 ENCOUNTER — Telehealth: Payer: Self-pay | Admitting: Oncology

## 2019-03-18 NOTE — Telephone Encounter (Signed)
I left a message with patient

## 2019-03-22 ENCOUNTER — Encounter: Payer: Self-pay | Admitting: *Deleted

## 2019-03-22 NOTE — Progress Notes (Signed)
[place] Work  Clinical Social Work received referral for connecting patient with a PCP.  CSW team does not typical connect patients with PCP's.  However, patients can call this "hotline" [phone] to be connected with PCP in the [facility] system.  CSW contacted patient at home and left a detailed message with information above.  If patients insurance payor is not accepted by providers CSW encouraged patient to contact her insurance company.  CSW provided contact information and encouraged patient to call with additional questions or concerns.    Johnnye Lana, MSW, LCSW, OSW-C Clinical Social Worker [facility] [phone]

## 2020-02-21 ENCOUNTER — Other Ambulatory Visit: Payer: Self-pay | Admitting: Oncology

## 2020-02-22 ENCOUNTER — Telehealth: Payer: Self-pay | Admitting: Oncology

## 2020-02-22 NOTE — Telephone Encounter (Signed)
Rescheduled patient per 7/19 message. Patient is aware of new appointment time.

## 2020-03-15 ENCOUNTER — Other Ambulatory Visit: Payer: Self-pay

## 2020-03-15 DIAGNOSIS — Z171 Estrogen receptor negative status [ER-]: Secondary | ICD-10-CM

## 2020-03-16 ENCOUNTER — Inpatient Hospital Stay: Payer: PRIVATE HEALTH INSURANCE

## 2020-03-16 ENCOUNTER — Other Ambulatory Visit: Payer: PRIVATE HEALTH INSURANCE

## 2020-03-16 ENCOUNTER — Ambulatory Visit: Payer: PRIVATE HEALTH INSURANCE | Admitting: Oncology

## 2020-03-16 ENCOUNTER — Inpatient Hospital Stay: Payer: PRIVATE HEALTH INSURANCE | Admitting: Adult Health

## 2020-05-01 ENCOUNTER — Ambulatory Visit: Payer: PRIVATE HEALTH INSURANCE | Admitting: Adult Health

## 2020-05-01 ENCOUNTER — Other Ambulatory Visit: Payer: PRIVATE HEALTH INSURANCE
